# Patient Record
Sex: Male | Born: 1970
Health system: Southern US, Community
[De-identification: ages and names within clinical notes are randomized; demographics above are authoritative.]

## PROBLEM LIST (undated history)

## (undated) DIAGNOSIS — G4730 Sleep apnea, unspecified: Secondary | ICD-10-CM

## (undated) DIAGNOSIS — E785 Hyperlipidemia, unspecified: Secondary | ICD-10-CM

## (undated) DIAGNOSIS — Z972 Presence of dental prosthetic device (complete) (partial): Secondary | ICD-10-CM

## (undated) DIAGNOSIS — E291 Testicular hypofunction: Secondary | ICD-10-CM

## (undated) DIAGNOSIS — E669 Obesity, unspecified: Secondary | ICD-10-CM

## (undated) DIAGNOSIS — K219 Gastro-esophageal reflux disease without esophagitis: Secondary | ICD-10-CM

## (undated) DIAGNOSIS — E119 Type 2 diabetes mellitus without complications: Secondary | ICD-10-CM

## (undated) DIAGNOSIS — I10 Essential (primary) hypertension: Secondary | ICD-10-CM

## (undated) DIAGNOSIS — R300 Dysuria: Secondary | ICD-10-CM

## (undated) DIAGNOSIS — J189 Pneumonia, unspecified organism: Secondary | ICD-10-CM

## (undated) DIAGNOSIS — N400 Enlarged prostate without lower urinary tract symptoms: Secondary | ICD-10-CM

## (undated) DIAGNOSIS — Z87442 Personal history of urinary calculi: Secondary | ICD-10-CM

## (undated) DIAGNOSIS — M1990 Unspecified osteoarthritis, unspecified site: Secondary | ICD-10-CM

## (undated) DIAGNOSIS — N529 Male erectile dysfunction, unspecified: Secondary | ICD-10-CM

## (undated) DIAGNOSIS — R351 Nocturia: Secondary | ICD-10-CM

## (undated) HISTORY — PX: CHOLECYSTECTOMY: SHX55

## (undated) HISTORY — DX: Testicular hypofunction: E29.1

## (undated) HISTORY — DX: Dysuria: R30.0

## (undated) HISTORY — DX: Unspecified osteoarthritis, unspecified site: M19.90

## (undated) HISTORY — DX: Gastro-esophageal reflux disease without esophagitis: K21.9

## (undated) HISTORY — DX: Type 2 diabetes mellitus without complications: E11.9

## (undated) HISTORY — DX: Benign prostatic hyperplasia without lower urinary tract symptoms: N40.0

## (undated) HISTORY — DX: Essential (primary) hypertension: I10

## (undated) HISTORY — DX: Hyperlipidemia, unspecified: E78.5

## (undated) HISTORY — DX: Obesity, unspecified: E66.9

## (undated) HISTORY — DX: Sleep apnea, unspecified: G47.30

## (undated) HISTORY — DX: Male erectile dysfunction, unspecified: N52.9

## (undated) HISTORY — PX: VASECTOMY: SHX75

## (undated) HISTORY — DX: Nocturia: R35.1

---

## 2004-04-18 ENCOUNTER — Inpatient Hospital Stay: Payer: Self-pay | Admitting: Internal Medicine

## 2007-02-01 ENCOUNTER — Ambulatory Visit: Payer: Self-pay

## 2007-05-28 ENCOUNTER — Ambulatory Visit: Payer: Self-pay

## 2007-06-25 ENCOUNTER — Ambulatory Visit: Payer: Self-pay | Admitting: Family Medicine

## 2007-11-10 ENCOUNTER — Ambulatory Visit: Payer: Self-pay | Admitting: Family Medicine

## 2007-11-11 ENCOUNTER — Ambulatory Visit: Payer: Self-pay | Admitting: Internal Medicine

## 2007-11-12 ENCOUNTER — Ambulatory Visit: Payer: Self-pay

## 2009-06-10 ENCOUNTER — Emergency Department: Payer: Self-pay | Admitting: Emergency Medicine

## 2010-12-24 ENCOUNTER — Ambulatory Visit: Payer: Self-pay | Admitting: Family Medicine

## 2011-03-12 ENCOUNTER — Ambulatory Visit: Payer: Self-pay | Admitting: Family Medicine

## 2011-04-02 ENCOUNTER — Ambulatory Visit: Payer: Self-pay | Admitting: Family Medicine

## 2011-04-28 ENCOUNTER — Ambulatory Visit: Payer: Self-pay | Admitting: Family Medicine

## 2012-05-09 ENCOUNTER — Inpatient Hospital Stay: Payer: Self-pay | Admitting: Surgery

## 2012-05-09 LAB — BASIC METABOLIC PANEL
Anion Gap: 7 (ref 7–16)
BUN: 9 mg/dL (ref 7–18)
Chloride: 102 mmol/L (ref 98–107)
Co2: 24 mmol/L (ref 21–32)
EGFR (African American): >60
Glucose: 234 mg/dL — ABNORMAL HIGH (ref 65–99)
Osmolality: 273 (ref 275–301)
Potassium: 4.7 mmol/L (ref 3.5–5.1)
Sodium: 133 mmol/L — ABNORMAL LOW (ref 136–145)

## 2012-05-09 LAB — CBC
HGB: 16.9 g/dL (ref 13.0–18.0)
MCHC: 33.5 g/dL (ref 32.0–36.0)
Platelet: 241 10*3/uL (ref 150–440)
RBC: 5.68 10*6/uL (ref 4.40–5.90)
RDW: 13.4 % (ref 11.5–14.5)
WBC: 13.7 10*3/uL — ABNORMAL HIGH (ref 3.8–10.6)

## 2012-05-09 LAB — TROPONIN I: Troponin-I: <0.02 ng/mL

## 2012-05-09 LAB — HEPATIC FUNCTION PANEL A (ARMC)
Bilirubin,Total: 0.4 mg/dL (ref 0.2–1.0)
SGOT(AST): 32 U/L (ref 15–37)
SGPT (ALT): 56 U/L (ref 12–78)
Total Protein: 7.7 g/dL (ref 6.4–8.2)

## 2012-05-09 LAB — LIPASE, BLOOD: Lipase: 144 U/L (ref 73–393)

## 2012-05-10 LAB — CBC WITH DIFFERENTIAL/PLATELET
Basophil #: 0 10*3/uL (ref 0.0–0.1)
Eosinophil %: 0.1 %
HCT: 43.8 % (ref 40.0–52.0)
HGB: 14.8 g/dL (ref 13.0–18.0)
Lymphocyte %: 5.5 %
MCH: 30.3 pg (ref 26.0–34.0)
MCV: 90 fL (ref 80–100)
Monocyte #: 1.1 x10 3/mm — ABNORMAL HIGH (ref 0.2–1.0)
Monocyte %: 5.6 %
Neutrophil %: 88.6 %
Platelet: 201 10*3/uL (ref 150–440)
RBC: 4.88 10*6/uL (ref 4.40–5.90)

## 2012-05-10 LAB — COMPREHENSIVE METABOLIC PANEL
Alkaline Phosphatase: 81 U/L (ref 50–136)
EGFR (African American): 53 — ABNORMAL LOW
EGFR (Non-African Amer.): 46 — ABNORMAL LOW
Glucose: 127 mg/dL — ABNORMAL HIGH (ref 65–99)
Potassium: 4.5 mmol/L (ref 3.5–5.1)
SGOT(AST): 34 U/L (ref 15–37)
Sodium: 136 mmol/L (ref 136–145)
Total Protein: 6.1 g/dL — ABNORMAL LOW (ref 6.4–8.2)

## 2012-05-11 LAB — CBC WITH DIFFERENTIAL/PLATELET
Basophil %: 0.2 %
Eosinophil #: 0 10*3/uL (ref 0.0–0.7)
HCT: 38.5 % — ABNORMAL LOW (ref 40.0–52.0)
Lymphocyte #: 0.7 10*3/uL — ABNORMAL LOW (ref 1.0–3.6)
Lymphocyte %: 4.8 %
MCH: 30.4 pg (ref 26.0–34.0)
MCV: 90 fL (ref 80–100)
Monocyte #: 1.1 x10 3/mm — ABNORMAL HIGH (ref 0.2–1.0)
Neutrophil #: 12 10*3/uL — ABNORMAL HIGH (ref 1.4–6.5)
Neutrophil %: 87.1 %
RBC: 4.29 10*6/uL — ABNORMAL LOW (ref 4.40–5.90)
RDW: 13.6 % (ref 11.5–14.5)

## 2012-05-11 LAB — BASIC METABOLIC PANEL
BUN: 15 mg/dL (ref 7–18)
Calcium, Total: 7.9 mg/dL — ABNORMAL LOW (ref 8.5–10.1)
Creatinine: 1.02 mg/dL (ref 0.60–1.30)
EGFR (African American): >60
Glucose: 159 mg/dL — ABNORMAL HIGH (ref 65–99)
Osmolality: 278 (ref 275–301)
Potassium: 4.2 mmol/L (ref 3.5–5.1)

## 2012-06-15 DIAGNOSIS — Z79899 Other long term (current) drug therapy: Secondary | ICD-10-CM | POA: Insufficient documentation

## 2014-05-02 ENCOUNTER — Ambulatory Visit
Admit: 2014-05-02 | Disposition: A | Discharge status: Home / Self Care | Payer: Self-pay | Attending: Urology | Admitting: Urology

## 2014-05-02 LAB — CBC WITH DIFFERENTIAL/PLATELET
BASOS ABS: 0.3 10*3/uL — AB (ref 0.0–0.1)
BASOS PCT: 3.1 %
EOS ABS: 0.2 10*3/uL (ref 0.0–0.7)
Eosinophil %: 2.6 %
HCT: 48 % (ref 40.0–52.0)
HGB: 16.1 g/dL (ref 13.0–18.0)
LYMPHS PCT: 18.9 %
Lymphocyte #: 1.6 10*3/uL (ref 1.0–3.6)
MCH: 29.2 pg (ref 26.0–34.0)
MCHC: 33.5 g/dL (ref 32.0–36.0)
MCV: 87 fL (ref 80–100)
MONO ABS: 0.4 x10 3/mm (ref 0.2–1.0)
Monocyte %: 5.2 %
Neutrophil #: 5.8 10*3/uL (ref 1.4–6.5)
Neutrophil %: 70.2 %
Platelet: 181 10*3/uL (ref 150–440)
RBC: 5.5 10*6/uL (ref 4.40–5.90)
RDW: 14 % (ref 11.5–14.5)
WBC: 8.2 10*3/uL (ref 3.8–10.6)

## 2014-05-19 NOTE — Discharge Summary (Signed)
PATIENT NAME:  Ronald Pruitt, Ronald Pruitt MR#:  [id number] DATE OF BIRTH:  [birth year]  DATE OF ADMISSION:  05/09/2012  DATE OF DISCHARGE:  05/12/2012  FINAL DIAGNOSES:  Acute cholecystitis, hypertension, obesity, hypercholesterolemia and type 2 diabetes.  PRINCIPAL PROCEDURE: Laparoscopic cholecystectomy on 05/10/2012.   HOSPITAL COURSE SUMMARY:  The patient was admitted on the 13th of April with abdominal pain and clinical diagnosis of acute cholecystitis. He was taken to the operating room on the 14th where a cholecystectomy was performed. Postoperatively, the patient did well. On postoperative day #1, the patient had low-grade fever. His creatinine was improving with hydration. His antibiotics were continued.  On postoperative day #2, the patient was stable and improved for discharge, remaining afebrile. He was tolerating a regular diet, ambulatory with minimal pain. Discharged home in stable condition. Follow up on April 24th.   MEDICATIONS:  Omeprazole 20 mg by mouth once a day, allopurinol 100 mg by mouth once a day, Janumet 1000 mg/50 mg tablet 1 tab by mouth b.i.d., lisinopril 10 mg by mouth once a day, amlodipine 10 mg by mouth once a day, simvastatin 20 mg by mouth once a day, acetaminophen/oxycodone 5/325, 1 tab every 4 hours as needed for pain, metformin 1000 mg by mouth b.i.d. with meals, sitagliptin 50 mg by mouth with meals and Augmentin 500 mg by mouth b.i.d. for 5 more days.    ____________________________ Jeannette How Marina Gravel, MD mab:dmm D: 05/25/2012 21:21:00 ET T: 05/25/2012 21:43:11 ET JOB#: [id number]  cc: Elta Guadeloupe A. Marina Gravel, MD, <Dictator> Rose Fillers. [place], [place] Keaira Whitehurst A Kambryn Dapolito MD ELECTRONICALLY SIGNED 05/25/2012 22:06

## 2014-05-19 NOTE — H&P (Signed)
PATIENT NAME:  Ronald Pruitt, Ronald Pruitt MR#:  [id number] DATE OF BIRTH:  1970/02/01  DATE OF ADMISSION:  05/09/2012  ATTENDING PHYSICIAN: Dr. Marlyce Huge.   REASON FOR ADMISSION: Epigastric and right upper quadrant pain.   HISTORY OF PRESENT ILLNESS: The patient is a pleasant 44 year old male with a history of hypertension and GERD who presents with now 9 hours of acute-onset epigastric and right upper quadrant pain. He says that it began acutely and woke him up from sleep on 1 in the morning. It has not improved. He describes it as cramping and worsening. He says that he forced himself to throw up to see if that would make him feel better, and it did not improve.   He also reports chills. He says he is having regular bowel movements. Has never had pain like this before. Otherwise no fevers, night sweats, shortness of breath, cough, chest pain, current  nausea, vomiting, irregular bowel movements. No dysuria or hematuria.   PAST MEDICAL HISTORY:  1.  History of gout. 2.  Hypertension.  3.  Migraines.  4.  GERD.   HOME MEDICATIONS:  Are as follows:  1.  Toprol-XL 50 mg p.o. daily.  2.  Nexium 1 tablet p.o. daily.  3.  Excedrin Migraine p.o., p.r.n.  4.  Allopurinol 0.6 p.r.n., gout.   ALLERGIES: No known drug allergies.   SOCIAL HISTORY: Denies tobacco use. Lives in [place]. Does say he rarely drinks alcohol socially.   FAMILY HISTORY: Diabetes, hypertension, coronary artery disease, family history of breast and lung cancer.   REVIEW OF SYSTEMS: 12-point review of systems obtained. Pertinent positives and negatives as above.   PHYSICAL EXAMINATION: VITAL SIGNS: 98.3, pulse 72, blood pressure 180/104, respirations 18; 98% on room air.  GENERAL: No acute distress. Alert and oriented x3.  HEAD: Normocephalic, atraumatic.  EYES: No scleral icterus. No conjunctivitis.  NECK: No obvious neck masses.  CHEST: Lungs clear to auscultation, moving air well.  HEART: Regular rate and  rhythm. No murmurs, rubs, or gallops.  ABDOMEN: Mildly tender in the right upper quadrant, nondistended.  EXTREMITIES: Moves all extremities well. Strength 5/5.  NEUROLOGIC: Cranial nerves II-XII grossly intact. Sensation intact in all 4 extremities.   LABS: Are as follows: Significant for a white cell count of 13.7, hemoglobin and hematocrit of 16.9/50.5, platelets 241. LFTs are normal. Blood sugar is 234.   Ultrasound shows a large, nonmobile 2.1 cm stone in the gallbladder neck.   ASSESSMENT AND PLAN: The patient is a pleasant 44 year old male with a history of acute-onset right upper quadrant pain which sounds biliary in nature. He has an elevated white blood cell count as well as an impacted stone. Likely has a cholecystitis versus symptomatic cholelithiasis with hydrops.   Will admit, resuscitate, and plan cholecystectomy, possibly today. I have spoken about this plan with him, and this will be the  plan.    ____________________________ Glena Norfolk. Ronald Hillmer, MD cal:dm D: 05/09/2012 11:00:41 ET T: 05/09/2012 11:46:27 ET JOB#: [id number]  cc:   Floyde Parkins MD ELECTRONICALLY SIGNED 05/12/2012 16:13

## 2014-05-19 NOTE — Op Note (Signed)
PATIENT NAME:  Ronald Pruitt, Ronald Pruitt MR#:  [id number] DATE OF BIRTH:  08-23-1970  DATE OF PROCEDURE:  05/10/2012  PREOPERATIVE DIAGNOSIS: Acute calculus cholecystitis.   POSTOPERATIVE DIAGNOSES:  1.  Acute acalculus cholecystitis. 2.  Gallbladder hydrops.   PROCEDURE PERFORMED: Laparoscopic cholecystectomy.   SURGEON: Sherri Rad, M.D.   ASSISTANT: None.   ANESTHESIA: General endotracheal.   INDICATIONS: The patient is a 44 year old obese white male with a 1 to 2 day history of unrelenting right upper quadrant and epigastric abdominal pain. Clinical examination and radiographic workup is consistent with acute calculus cholecystitis. I discussed with him and his daughter at length intervention to include laparoscopic cholecystectomy, the risks of surgery including that of bleeding, infection, bile duct injury, need for conversion to open operation. All of their questions were answered.   FINDINGS: Gallbladder hydrops. There was a large stone lodged within the gallbladder neck.   SPECIMENS: Gallbladder with contents to pathology.   DRAINS: None.   LAP AND NEEDLE COUNT: Correct x 2.   ESTIMATED BLOOD LOSS: 25 mL.   DESCRIPTION OF PROCEDURE: With the patient in the supine position, general endotracheal anesthesia was induced without difficulty. The patient's abdomen was clipped of hair, prepped and draped with ChloraPrep solution. Timeout was observed.   A primary infraumbilical transversely oriented skin incision was fashioned with a scalpel and carried down with sharp dissection to the abdominal midline fascia, which was incised in the midline with a scalpel and elevated with Kocher clamps. The peritoneum was entered sharply. A 0 Vicryl U-stitch was passed on either side of the abdominal midline fascia securing a 12 mm blunt Hassan trocar placed under direct visualization. Pneumoperitoneum was established. The patient was then positioned in reverse Trendelenburg in  right side up. The remaining  5 mm trocars were placed in the mid epigastrium and 2 in the right subcostal margin. The gallbladder was aspirated of approximately 30 mL of clear hydropic bile. This was grasped along its fundus and elevated over the right lobe of the liver towards the right shoulder. Lateral traction was placed on Hartmann pouch. The hepatoduodenal ligament was then incised and dissected demonstrating a single cystic duct and single cystic artery with posterior branch. Critical view of safety cholecystectomy was ensured. Three clips were placed on the portal side of the cystic duct, one clip on the gallbladder side and the structure was then divided. Immediately adjacent, a cystic artery with posterior branch was identified. Intervening lymphatic was divided between hemoclips.   The gallbladder was then retrieved off the gallbladder fossa utilizing hook cautery apparatus. One small superficial venous structure was divided between hemoclips. The gallbladder was then retrieved off the remaining portion of the gallbladder fossa and placed into an Endo Catch device and retrieved. Pneumoperitoneum was re-established. In the process of retrieving the gallbladder specimen, a 5 mm camera was inserted in the epigastric port. No evidence of bowel injury at the periumbilical incision site.   The right upper quadrant was then irrigated with a total of 1 L of warm normal saline and aspirated dry and point hemostasis in the gallbladder fossa was obtained with electrocautery. With no evidence of further bleeding and no evidence of bile staining or bowel injury, ports were then removed under direct visualization. A total of 30 mL of 0.25% plain Marcaine was infiltrated along all skin and fascial incisions prior to closure.   The infraumbilical fascial defect was closed with figure-of-eight #0 Vicryl suture x 2 and the existing  stay sutures tied to each  other in vertical orientation. 4-0 Vicryl subcuticular was applied to all skin edges  followed by the application of benzoin, Steri-Strips, Telfa, and Tegaderm. The patient was then taken to the recovery room in stable and satisfactory condition by anesthesia services.   ____________________________ Jeannette How Marina Gravel, MD mab:aw D: 05/10/2012 12:05:05 ET T: 05/10/2012 12:15:31 ET JOB#: [id number]  cc: Elta Guadeloupe A. Marina Gravel, MD, <Dictator> Brett Albino, MD [place] MD ELECTRONICALLY SIGNED 05/21/2012 9:28

## 2014-09-14 ENCOUNTER — Encounter: Payer: Self-pay | Admitting: *Deleted

## 2014-09-22 ENCOUNTER — Ambulatory Visit: Payer: Self-pay | Admitting: Urology

## 2015-03-14 ENCOUNTER — Encounter: Payer: Self-pay | Admitting: Urology

## 2015-03-14 ENCOUNTER — Ambulatory Visit (INDEPENDENT_AMBULATORY_CARE_PROVIDER_SITE_OTHER): Payer: BLUE CROSS/BLUE SHIELD | Admitting: Urology

## 2015-03-14 VITALS — BP 177/103 | HR 82 | Ht 72.0 in | Wt 238.3 lb

## 2015-03-14 DIAGNOSIS — N138 Other obstructive and reflux uropathy: Secondary | ICD-10-CM

## 2015-03-14 DIAGNOSIS — N528 Other male erectile dysfunction: Secondary | ICD-10-CM

## 2015-03-14 DIAGNOSIS — N529 Male erectile dysfunction, unspecified: Secondary | ICD-10-CM

## 2015-03-14 DIAGNOSIS — N401 Enlarged prostate with lower urinary tract symptoms: Secondary | ICD-10-CM

## 2015-03-14 DIAGNOSIS — E291 Testicular hypofunction: Secondary | ICD-10-CM | POA: Diagnosis not present

## 2015-03-14 MED ORDER — TADALAFIL 5 MG PO TABS: ORAL_TABLET | ORAL | Status: DC

## 2015-03-14 NOTE — Progress Notes (Signed)
03/14/2015 4:27 PM   Ronald Pruitt. Dec 26, 1970 [id number]  Referring provider: No referring provider defined for this encounter.  Chief Complaint  Patient presents with  . Hypogonadism    Needs new testosterone rx has new insurance    HPI: Patient is a 45 year old Caucasian male who has a history of hypogonadism and was under therapy but then lost his insurance and had to discontinue his therapy. He presents now to be restarted on a testosterone regimen.  Hypogonadism Patient is experiencing a decrease in libido, a lack of energy, a decrease in strength, a decreased enjoyment in life, erections being less strong, falling asleep after dinner and a recent deterioration in their work performance.  This is indicated by his responses to the ADAM questionnaire.       Androgen Deficiency in the Aging Male      03/14/15 1600       Androgen Deficiency in the Aging Male   Do you have a decrease in libido (sex drive) Yes     Do you have lack of energy Yes     Do you have a decrease in strength and/or endurance Yes     Have you lost height No     Have you noticed a decreased "enjoyment of life" Yes     Are you sad and/or grumpy No     Are your erections less strong Yes     Have you noticed a recent deterioration in your ability to play sports No     Are you falling asleep after dinner Yes     Has there been a recent deterioration in your work performance Yes         Erectile dysfunction His SHIM score is 13, which is mild to moderate ED.   He has been having difficulty with erections for the last few years.   His major complaint is confidence to achieve an erection.  His libido is preserved.   His risk factors for ED are age, smoking, BPH, hypogonadism, hypertension and hyperlipidemia.  He denies any painful erections or curvatures with his erections.   He has tried Cialis in the past with good results.      SHIM      03/14/15 1600       SHIM: Over the last 6 months:   How do you rate your confidence that you could get and keep an erection? Very Low     When you had erections with sexual stimulation, how often were your erections hard enough for penetration (entering your partner)? Sometimes (about half the time)     During sexual intercourse, how often were you able to maintain your erection after you had penetrated (entered) your partner? Difficult     During sexual intercourse, how difficult was it to maintain your erection to completion of intercourse? Difficult     When you attempted sexual intercourse, how often was it satisfactory for you? Difficult     SHIM Total Score   SHIM 13        Score: 1-7 Severe ED 8-11 Moderate ED 12-16 Mild-Moderate ED 17-21 Mild ED 22-25 No ED  BPH WITH LUTS His IPSS score today is 14, which is moderate lower urinary tract symptomatology. He is mixed with his quality life due to his urinary symptoms.  His major complaint today is frequency.  He has had these symptoms for few years.  He stated he did well with the Cialis 5 mg daily and would  like to restart that medication   He denies any dysuria, hematuria or suprapubic pain.  He also denies any recent fevers, chills, nausea or vomiting.  He does not have a family history of PCa.      IPSS      03/14/15 1500       International Prostate Symptom Score   How often have you had the sensation of not emptying your bladder? Less than half the time     How often have you had to urinate less than every two hours? About half the time     How often have you found you stopped and started again several times when you urinated? Not at All     How often have you found it difficult to postpone urination? Less than 1 in 5 times     How often have you had a weak urinary stream? Less than half the time     How often have you had to strain to start urination? Less than 1 in 5 times     How many times did you typically get up at night to urinate? 5 Times     Total IPSS Score 14       Quality of Life due to urinary symptoms   If you were to spend the rest of your life with your urinary condition just the way it is now how would you feel about that? Mixed        Score:  1-7 Mild 8-19 Moderate 20-35 Severe  PMH: Past Medical History  Diagnosis Date  . Dysuria   . Nocturia   . HTN (hypertension)   . Sleep apnea   . Diabetes ([place])   . GERD (gastroesophageal reflux disease)   . Impotence   . BPH (benign prostatic hyperplasia)   . Testicular hypofunction   . Obesity     Surgical History: Past Surgical History  Procedure Laterality Date  . Cholecystectomy    . Vasectomy      Home Medications:    Medication List       This list is accurate as of: 03/14/15  4:27 PM.  Always use your most recent med list.               allopurinol 100 MG tablet  Commonly known as:  ZYLOPRIM  Take 100 mg by mouth daily.     amLODipine 10 MG tablet  Commonly known as:  NORVASC  Take 10 mg by mouth daily.     atorvastatin 20 MG tablet  Commonly known as:  LIPITOR  Take 20 mg by mouth daily.     diltiazem 240 MG 24 hr tablet  Commonly known as:  CARDIZEM LA  Take 240 mg by mouth daily. Reported on 03/14/2015     lisinopril 10 MG tablet  Commonly known as:  PRINIVIL,ZESTRIL  Take 10 mg by mouth daily. Reported on 03/14/2015     omeprazole 20 MG capsule  Commonly known as:  PRILOSEC  Take 20 mg by mouth daily.     sitaGLIPtin-metformin 50-1000 MG tablet  Commonly known as:  JANUMET  Take 1 tablet by mouth 2 (two) times daily with a meal. Reported on 03/14/2015     tadalafil 5 MG tablet  Commonly known as:  CIALIS  Take one tablet daily for BPH     testosterone cypionate 200 MG/ML injection  Commonly known as:  DEPOTESTOSTERONE CYPIONATE  Inject 200 mg into the muscle every 14 (fourteen) days. Reported on  03/14/2015        Allergies: No Known Allergies  Family History: Family History  Problem Relation Age of Onset  . Hyperlipidemia Mother   .  Diabetes Mellitus II Mother   . Diabetes Father   . Seizures Mother   . Kidney disease Neg Hx   . Prostate cancer Neg Hx     Social History:  reports that he has been smoking.  He does not have any smokeless tobacco history on file. He reports that he does not drink alcohol or use illicit drugs.  ROS: UROLOGY Frequent Urination?: Yes Hard to postpone urination?: No Burning/pain with urination?: No Get up at night to urinate?: Yes Leakage of urine?: No Urine stream starts and stops?: No Trouble starting stream?: No Do you have to strain to urinate?: No Blood in urine?: No Urinary tract infection?: No Sexually transmitted disease?: No Injury to kidneys or bladder?: No Painful intercourse?: No Weak stream?: No Erection problems?: No Penile pain?: No  Gastrointestinal Nausea?: No Vomiting?: No Indigestion/heartburn?: Yes Diarrhea?: No Constipation?: No  Constitutional Fever: No Night sweats?: No Weight loss?: No Fatigue?: Yes  Skin Skin rash/lesions?: No Itching?: No  Eyes Blurred vision?: No Double vision?: No  Ears/Nose/Throat Sore throat?: No Sinus problems?: No  Hematologic/Lymphatic Swollen glands?: No Easy bruising?: No  Cardiovascular Leg swelling?: No Chest pain?: No  Respiratory Cough?: No Shortness of breath?: No  Endocrine Excessive thirst?: No  Musculoskeletal Back pain?: No Joint pain?: No  Neurological Headaches?: No Dizziness?: No  Psychologic Depression?: No Anxiety?: No  Physical Exam: BP 177/103 mmHg  Pulse 82  Ht 6' (1.829 m)  Wt 238 lb 4.8 oz (108.092 kg)  BMI 32.31 kg/m2  Constitutional: Well nourished. Alert and oriented, No acute distress. HEENT: [place] AT, moist mucus membranes. Trachea midline, no masses. Cardiovascular: No clubbing, cyanosis, or edema. Respiratory: Normal respiratory effort, no increased work of breathing. GI: Abdomen is soft, non tender, non distended, no abdominal masses. Liver and spleen  not palpable.  No hernias appreciated.  Stool sample for occult testing is not indicated.   GU: No CVA tenderness.  No bladder fullness or masses.  Patient with uncircumcised phallus. Foreskin easily retracted  Urethral meatus is patent.  No penile discharge. No penile lesions or rashes. Scrotum without lesions, cysts, rashes and/or edema.  Testicles are located scrotally bilaterally. No masses are appreciated in the testicles. Left and right epididymis are normal. Rectal: Patient with  normal sphincter tone. Anus and perineum without scarring or rashes. No rectal masses are appreciated. Prostate is approximately 45 grams, no nodules are appreciated. Seminal vesicles are normal. Skin: No rashes, bruises or suspicious lesions. Lymph: No cervical or inguinal adenopathy. Neurologic: Grossly intact, no focal deficits, moving all 4 extremities. Psychiatric: Normal mood and affect.  Laboratory Data: Lab Results  Component Value Date   WBC 8.2 05/02/2014   HGB 16.1 05/02/2014   HCT 48.0 05/02/2014   MCV 87 05/02/2014   PLT 181 05/02/2014    Lab Results  Component Value Date   CREATININE 1.02 05/11/2012    Lab Results  Component Value Date   AST 34 05/10/2012   Lab Results  Component Value Date   ALT 45 05/10/2012    Assessment & Plan:    1. Hypogonadism:   I explained to patient that hypogonadism is diagnosed after 2 morning serum testosterone draws before 9 AM 3 days apart returned below the laboratories parameter for normal testosterone. I reminded the patient the side effects of  testosterone therapy, such as: enlargement of the prostate gland that may in turn cause LUTS, possible increased risk of PCa, DVT's and/or PE's, possible increased risk of heart attack or stroke, lower sperm count, swelling of the ankles, feet, or body, with or without heart failure, enlarged or painful breasts, have problems breathing while you sleep (sleep apnea), increased prostate specific antigen, mood  swings, hypertension and increased red blood cell count.  I also discussed that some men have had success using clomid for hypogonadism.  It does seem to be more successful in younger men, but there are incidences of good results in middle-aged men.  I explained that it is used in male infertility to stimulate the testicles to make more testosterone/sperm.  There has been no long term data on side effects, but some urologists has been having success with this medication.   He will return in the morning before 9 AM for his first serum testosterone draw.  We'll also obtain an estradiol, FSH/LH, prolactin, TSH and hepatic function panel at that time.    2. Erectile dysfunction:  SHIM score is 13.  We will readdress once we get his testosterone within therapeutic levels.  3. BPH with LUTS:   IPSS score is 14/3.  He is a good response with Cialis 5 mg daily in the past. We will restart that medication. We will repeat the I PSS score and exam.  Return for pending labs.  These notes generated with voice recognition software. I apologize for typographical errors.  Zara Council, [facility] [address], [place], [place] [address] [phone]

## 2015-03-15 DIAGNOSIS — N138 Other obstructive and reflux uropathy: Secondary | ICD-10-CM | POA: Insufficient documentation

## 2015-03-15 DIAGNOSIS — E291 Testicular hypofunction: Secondary | ICD-10-CM | POA: Insufficient documentation

## 2015-03-15 DIAGNOSIS — N401 Enlarged prostate with lower urinary tract symptoms: Secondary | ICD-10-CM

## 2015-03-15 DIAGNOSIS — N529 Male erectile dysfunction, unspecified: Secondary | ICD-10-CM | POA: Insufficient documentation

## 2015-03-27 ENCOUNTER — Telehealth: Payer: Self-pay

## 2015-03-27 NOTE — Telephone Encounter (Signed)
I never received the results of his blood work, so I cannot call in any medications until I receive those labs.

## 2015-03-27 NOTE — Telephone Encounter (Signed)
Pt called stating he had blood work done by the work [employer] and was under the impression you would be calling in testosterone. Please advise.

## 2015-03-29 NOTE — Telephone Encounter (Signed)
LMOM- no labs received therefore no medication sent in

## 2015-04-12 ENCOUNTER — Telehealth: Payer: Self-pay | Admitting: Urology

## 2015-04-12 ENCOUNTER — Other Ambulatory Visit: Payer: Self-pay | Admitting: Urology

## 2015-04-12 DIAGNOSIS — E291 Testicular hypofunction: Secondary | ICD-10-CM

## 2015-04-12 MED ORDER — CLOMIPHENE CITRATE 50 MG PO TABS: ORAL_TABLET | ORAL | Status: DC

## 2015-04-12 NOTE — Telephone Encounter (Signed)
I spoke to the patient and we will start the Clomid.  He will have his employer draw the testosterone labs before 9 AM and fax them to [place]. I have sent the prescription to [facility].

## 2015-04-12 NOTE — Telephone Encounter (Signed)
I have tried to contact the patient.  The cell phone number listed was for a [employer] and his home phone number voice mail was full.  Is he associated with a [employer]?

## 2015-06-10 ENCOUNTER — Telehealth: Payer: Self-pay | Admitting: Urology

## 2015-06-10 NOTE — Telephone Encounter (Signed)
Patient was started on Clomid in March.

## 2015-06-10 NOTE — Telephone Encounter (Signed)
Patient was given Clomid in March. I do not see where he had a morning testosterone drawn before 9 AM recently. Please contact the patient and have him come in for a morning testosterone drawn before 9 AM.

## 2015-06-13 NOTE — Telephone Encounter (Signed)
LMOM

## 2015-06-14 NOTE — Telephone Encounter (Signed)
Spoke with pt in reference to labs. Pt stated that he had them drawn at his employers. Made pt aware we do not have the results and requested pt have them refaxed. Pt voiced understanding.

## 2015-06-26 ENCOUNTER — Other Ambulatory Visit: Payer: Self-pay

## 2015-07-15 NOTE — Telephone Encounter (Addendum)
Please apologize to the patient as no one notified me that they had scanned his labs into the chart on May 30.  I have reviewed the lab and his testosterone level is within normal limits, but it was drawn in the afternoon. When the patient is on Clomid the testosterone needs to be drawn between the levels of 8 and 10 in the morning.  He will need an office visit with me in August and we can either repeat those labs or his employer can repeat the labs.

## 2015-07-16 NOTE — Telephone Encounter (Signed)
Mailbox full

## 2015-07-18 NOTE — Telephone Encounter (Signed)
Not able to get in touch with pt. Will send a letter.  

## 2015-10-23 ENCOUNTER — Ambulatory Visit (INDEPENDENT_AMBULATORY_CARE_PROVIDER_SITE_OTHER): Payer: BLUE CROSS/BLUE SHIELD | Admitting: Urology

## 2015-10-23 ENCOUNTER — Encounter: Payer: Self-pay | Admitting: Urology

## 2015-10-23 ENCOUNTER — Telehealth: Payer: Self-pay | Admitting: Family Medicine

## 2015-10-23 VITALS — BP 158/115 | HR 83 | Ht 73.0 in | Wt 239.0 lb

## 2015-10-23 DIAGNOSIS — R351 Nocturia: Secondary | ICD-10-CM | POA: Diagnosis not present

## 2015-10-23 DIAGNOSIS — N401 Enlarged prostate with lower urinary tract symptoms: Secondary | ICD-10-CM

## 2015-10-23 DIAGNOSIS — N138 Other obstructive and reflux uropathy: Secondary | ICD-10-CM

## 2015-10-23 DIAGNOSIS — N529 Male erectile dysfunction, unspecified: Secondary | ICD-10-CM

## 2015-10-23 DIAGNOSIS — E291 Testicular hypofunction: Secondary | ICD-10-CM | POA: Diagnosis not present

## 2015-10-23 LAB — BLADDER SCAN AMB NON-IMAGING

## 2015-10-23 MED ORDER — CLOMIPHENE CITRATE 50 MG PO TABS: ORAL_TABLET | ORAL | 3 refills | Status: DC

## 2015-10-23 NOTE — Telephone Encounter (Signed)
Patient states that it has been years that you seen him, wanted to know if he could start coming back to you. Has been getting care from NP from work  Cb# [phone]

## 2015-10-23 NOTE — Telephone Encounter (Signed)
Not taking new patients right now  

## 2015-10-23 NOTE — Progress Notes (Signed)
10/23/2015 11:17 AM   Ronald Pruitt. 10/03/1970 [id number]  Referring provider: No referring provider defined for this encounter.  Chief Complaint  Patient presents with  . Hypogonadism    HPI: Patient is a 45 year old Caucasian male with hypogonadism, ED and BPH with LUTS.    Hypogonadism Patient is experiencing a decrease in libido, a lack of energy, a decrease in strength, a decreased enjoyment in life, sadness and/or grumpiness, erections being less strong and falling asleep after dinner.  This is indicated by his responses to the ADAM questionnaire. He is not having spontaneous erections at night.  He has sleep apnea and is not sleeping with a CPAP machine.  he is currently managing his hypogonadism with Clomid 50 mg, 1/2 daily.  His most recent testosterone was 450 ng/dL in 04/2015 and this was an afternoon draw.       Androgen Deficiency in the Aging Male    [place] Name 10/23/15 1000         Androgen Deficiency in the Aging Male   Do you have a decrease in libido (sex drive) Yes     Do you have lack of energy Yes     Do you have a decrease in strength and/or endurance Yes     Have you lost height No     Have you noticed a decreased "enjoyment of life" Yes     Are you sad and/or grumpy Yes     Are your erections less strong Yes     Have you noticed a recent deterioration in your ability to play sports No     Are you falling asleep after dinner Yes     Has there been a recent deterioration in your work performance No       Erectile dysfunction His SHIM score is 16, which is mild to moderate ED.   His previous 13.  He has been having difficulty with erections for the last few years.   His major complaint is confidence to achieve an erection.  His libido is preserved.   His risk factors for ED are age, smoking, BPH, hypogonadism, sleep apnea, hypertension and hyperlipidemia.  He denies any painful erections or curvatures with his erections.   He has tried Cialis in  the past with good results, but he is finding cost prohibitive.  He is not sleeping with his CPAP machine.        [place] Name 10/23/15 1056         SHIM: Over the last 6 months:   How do you rate your confidence that you could get and keep an erection? Low     When you had erections with sexual stimulation, how often were your erections hard enough for penetration (entering your partner)? Most Times (much more than half the time)     During sexual intercourse, how often were you able to maintain your erection after you had penetrated (entered) your partner? Slightly Difficult     During sexual intercourse, how difficult was it to maintain your erection to completion of intercourse? Slightly Difficult     When you attempted sexual intercourse, how often was it satisfactory for you? Very Difficult       SHIM Total Score   SHIM 16        Score: 1-7 Severe ED 8-11 Moderate ED 12-16 Mild-Moderate ED 17-21 Mild ED 22-25 No ED  BPH WITH LUTS His IPSS score today is 22, which is  severe lower urinary tract symptomatology. He is terrible with his quality life due to his urinary symptoms.  His PVR was 0 mL.  His previous IPSS score was 14/3.  His major complaint today is nocturia.x 5    He has had these symptoms for few years.  He has untreated sleep apnea.   He denies any dysuria, hematuria or suprapubic pain.  He also denies any recent fevers, chills, nausea or vomiting.  He does not have a family history of PCa.      IPSS    [place] Name 10/23/15 1000         International Prostate Symptom Score   How often have you had the sensation of not emptying your bladder? More than half the time     How often have you had to urinate less than every two hours? Almost always     How often have you found you stopped and started again several times when you urinated? Less than 1 in 5 times     How often have you found it difficult to postpone urination? Not at All     How often have you had a  weak urinary stream? About half the time     How often have you had to strain to start urination? More than half the time     How many times did you typically get up at night to urinate? 5 Times     Total IPSS Score 22       Quality of Life due to urinary symptoms   If you were to spend the rest of your life with your urinary condition just the way it is now how would you feel about that? Terrible        Score:  1-7 Mild 8-19 Moderate 20-35 Severe  PMH: Past Medical History:  Diagnosis Date  . BPH (benign prostatic hyperplasia)   . Diabetes ([place])   . Dysuria   . GERD (gastroesophageal reflux disease)   . HTN (hypertension)   . Impotence   . Nocturia   . Obesity   . Sleep apnea   . Testicular hypofunction     Surgical History: Past Surgical History:  Procedure Laterality Date  . CHOLECYSTECTOMY    . VASECTOMY      Home Medications:    Medication List       Accurate as of 10/23/15 11:17 AM. Always use your most recent med list.          amLODipine 10 MG tablet Commonly known as:  NORVASC Take 10 mg by mouth daily.   atorvastatin 20 MG tablet Commonly known as:  LIPITOR Take 20 mg by mouth daily.   clomiPHENE 50 MG tablet Commonly known as:  CLOMID Take 1/2 tablet daily   diltiazem 240 MG 24 hr tablet Commonly known as:  CARDIZEM LA Take 240 mg by mouth daily. Reported on 03/14/2015   omeprazole 20 MG capsule Commonly known as:  PRILOSEC Take 20 mg by mouth daily.   sitaGLIPtin-metformin 50-1000 MG tablet Commonly known as:  JANUMET Take 1 tablet by mouth 2 (two) times daily with a meal. Reported on 03/14/2015   tadalafil 5 MG tablet Commonly known as:  CIALIS Take one tablet daily for BPH       Allergies: No Known Allergies  Family History: Family History  Problem Relation Age of Onset  . Hyperlipidemia Mother   . Diabetes Mellitus II Mother   . Seizures Mother   . Diabetes Father   .  Kidney disease Neg Hx   . Prostate cancer Neg Hx       Social History:  reports that he has been smoking Cigarettes.  He has been smoking about 1.00 pack per day. He has never used smokeless tobacco. He reports that he does not drink alcohol or use drugs.  ROS: UROLOGY Frequent Urination?: Yes Hard to postpone urination?: No Burning/pain with urination?: No Get up at night to urinate?: Yes Leakage of urine?: No Urine stream starts and stops?: No Trouble starting stream?: No Do you have to strain to urinate?: No Blood in urine?: No Urinary tract infection?: No Sexually transmitted disease?: No Injury to kidneys or bladder?: No Painful intercourse?: No Weak stream?: No Erection problems?: Yes Penile pain?: No  Gastrointestinal Nausea?: No Vomiting?: No Indigestion/heartburn?: No Diarrhea?: No Constipation?: No  Constitutional Fever: No Night sweats?: No Weight loss?: No Fatigue?: Yes  Skin Skin rash/lesions?: No Itching?: No  Eyes Blurred vision?: No Double vision?: No  Ears/Nose/Throat Sore throat?: No Sinus problems?: No  Hematologic/Lymphatic Swollen glands?: No Easy bruising?: No  Cardiovascular Leg swelling?: No Chest pain?: No  Respiratory Cough?: No Shortness of breath?: No  Endocrine Excessive thirst?: No  Musculoskeletal Back pain?: No Joint pain?: No  Neurological Headaches?: No Dizziness?: No  Psychologic Depression?: No Anxiety?: No  Physical Exam: BP (!) 158/115   Pulse 83   Ht 6\' 1"  (1.854 m)   Wt 239 lb (108.4 kg)   BMI 31.53 kg/m   Constitutional: Well nourished. Alert and oriented, No acute distress. HEENT: [place] AT, moist mucus membranes. Trachea midline, no masses. Cardiovascular: No clubbing, cyanosis, or edema. Respiratory: Normal respiratory effort, no increased work of breathing. GI: Abdomen is soft, non tender, non distended, no abdominal masses. Liver and spleen not palpable.  No hernias appreciated.  Stool sample for occult testing is not indicated.   GU: No  CVA tenderness.  No bladder fullness or masses.  Patient with circumcised phallus.  Urethral meatus is patent.  No penile discharge. No penile lesions or rashes. Scrotum without lesions, cysts, rashes and/or edema.  Testicles are located scrotally bilaterally. No masses are appreciated in the testicles. Left and right epididymis are normal. Rectal: Patient with  normal sphincter tone. Anus and perineum without scarring or rashes. No rectal masses are appreciated. Prostate is approximately 45 grams, no nodules are appreciated. Seminal vesicles are normal. Skin: No rashes, bruises or suspicious lesions. Lymph: No cervical or inguinal adenopathy. Neurologic: Grossly intact, no focal deficits, moving all 4 extremities. Psychiatric: Normal mood and affect.  Laboratory Data: Lab Results  Component Value Date   WBC 8.2 05/02/2014   HGB 16.1 05/02/2014   HCT 48.0 05/02/2014   MCV 87 05/02/2014   PLT 181 05/02/2014    Lab Results  Component Value Date   CREATININE 1.02 05/11/2012    Lab Results  Component Value Date   AST 34 05/10/2012   Lab Results  Component Value Date   ALT 45 05/10/2012   PSA was 0.3 ng/mL in 04/2015  Pertinent Imaging Results for MORDCHE, PARPART (MRN [id number]) as of 10/28/2015 20:09  Ref. Range 10/23/2015 10:58  Scan Result Unknown 1ml    Assessment & Plan:    1. Hypogonadism:     -most recent testosterone level is 450 ng/dL on 05/24/2015- afternoon draw by employer  -continue Clomid 50 mg, 1/2 tablet daily-refill given  -Patient will have employer draw a morning testosterone (before 10 AM), HCT, PSA and LFT's  2. BPH  with LUTS  - IPSS score is 22/6, it is worsening  - Continue conservative management, avoiding bladder irritants and timed voiding's  - Advised the patient to speak with his PCP about undergoing another sleep study or a different mask  - RTC in 6 months for IPSS, PSA, PVR and exam, as testosterone therapy can cause prostate enlargement  and worsen LUTS  3. Erectile dysfunction:     -SHIM score is 16  -encourage patient to obtain another sleep study or a new mask for his sleep apnea  -RTC in 6 months for SHIM score and exam, as testosterone therapy can affect erections  4. Nocturia  - I explained to the patient that nocturia is often multi-factorial and difficult to treat.  Sleeping disorders, heart conditions and peripheral vascular disease, diabetes,  enlarged prostate or urethral stricture causing bladder outlet obstruction and/or certain medications.  - I have suggested that the patient avoid caffeine and alcohol in the evening. He may also benefit from fluid restrictions after 6:00 in the evening and voiding just prior to bedtime.  - I have explained to the patient that research studies have showed that over 84% of patients with sleep apnea reported frequent nighttime urination.   With sleep apnea, oxygen decreases, carbon dioxide increases, the blood become more acidic, the heart rate drops and blood vessels in the lung constrict.  The body is then alerted that something is very wrong. The sleeper must wake enough to reopen the airway. By this time, the heart is racing and experiences a false signal of fluid overload. The heart excretes a hormone-like protein that tells the body to get rid of sodium and water, resulting in nocturia.  - The patient may also benefit from a discussion with his primary care physician to see if he needs a new sleep study and/or a new mask    Return in about 6 months (around 04/21/2016) for IPSS, ADAM, SHIM and exam.  These notes generated with voice recognition software. I apologize for typographical errors.  Zara Council, [facility] [address], [place], [place] [address] [phone]

## 2015-10-30 ENCOUNTER — Other Ambulatory Visit: Payer: Self-pay

## 2015-11-02 ENCOUNTER — Emergency Department: Payer: BLUE CROSS/BLUE SHIELD

## 2015-11-02 ENCOUNTER — Emergency Department
Admission: EM | Admit: 2015-11-02 | Discharge: 2015-11-02 | Disposition: A | Discharge status: Home / Self Care | Payer: BLUE CROSS/BLUE SHIELD | Attending: Student in an Organized Health Care Education/Training Program | Admitting: Student in an Organized Health Care Education/Training Program

## 2015-11-02 ENCOUNTER — Encounter: Payer: Self-pay | Admitting: Radiology

## 2015-11-02 DIAGNOSIS — F17210 Nicotine dependence, cigarettes, uncomplicated: Secondary | ICD-10-CM | POA: Insufficient documentation

## 2015-11-02 DIAGNOSIS — R0789 Other chest pain: Secondary | ICD-10-CM | POA: Diagnosis present

## 2015-11-02 DIAGNOSIS — Z79899 Other long term (current) drug therapy: Secondary | ICD-10-CM | POA: Diagnosis not present

## 2015-11-02 DIAGNOSIS — I10 Essential (primary) hypertension: Secondary | ICD-10-CM | POA: Diagnosis not present

## 2015-11-02 DIAGNOSIS — E119 Type 2 diabetes mellitus without complications: Secondary | ICD-10-CM | POA: Insufficient documentation

## 2015-11-02 DIAGNOSIS — R079 Chest pain, unspecified: Secondary | ICD-10-CM

## 2015-11-02 LAB — COMPREHENSIVE METABOLIC PANEL
ALK PHOS: 73 U/L (ref 38–126)
ALT: 43 U/L (ref 17–63)
AST: 40 U/L (ref 15–41)
Albumin: 4 g/dL (ref 3.5–5.0)
Anion gap: 6 (ref 5–15)
BUN: 11 mg/dL (ref 6–20)
CHLORIDE: 104 mmol/L (ref 101–111)
CO2: 24 mmol/L (ref 22–32)
CREATININE: 0.87 mg/dL (ref 0.61–1.24)
Calcium: 9.1 mg/dL (ref 8.9–10.3)
GFR calc Af Amer: >60 mL/min (ref >60)
Glucose, Bld: 246 mg/dL — ABNORMAL HIGH (ref 65–99)
Potassium: 4 mmol/L (ref 3.5–5.1)
Sodium: 134 mmol/L — ABNORMAL LOW (ref 135–145)
Total Bilirubin: 0.7 mg/dL (ref 0.3–1.2)
Total Protein: 7 g/dL (ref 6.5–8.1)

## 2015-11-02 LAB — CBC
HCT: 46.8 % (ref 40.0–52.0)
Hemoglobin: 16.4 g/dL (ref 13.0–18.0)
MCH: 31.2 pg (ref 26.0–34.0)
MCHC: 35.1 g/dL (ref 32.0–36.0)
MCV: 88.7 fL (ref 80.0–100.0)
PLATELETS: 171 10*3/uL (ref 150–440)
RBC: 5.27 MIL/uL (ref 4.40–5.90)
RDW: 13.7 % (ref 11.5–14.5)
WBC: 9.6 10*3/uL (ref 3.8–10.6)

## 2015-11-02 LAB — TROPONIN I
Troponin I: <0.03 ng/mL (ref <0.03)
Troponin I: <0.03 ng/mL (ref <0.03)

## 2015-11-02 MED ORDER — IOPAMIDOL (ISOVUE-370) INJECTION 76%
100.0000 mL | Freq: Once | INTRAVENOUS | Status: AC | PRN
  Administered 2015-11-02: 100 mL via INTRAVENOUS

## 2015-11-02 NOTE — ED Provider Notes (Signed)
Second troponin negative. No recurrence of symptoms. Workup reassuring. Vital signs reassuring, blood pressure improved. We'll discharge home.   Carrie Mew, MD 11/02/15 [phone]

## 2015-11-02 NOTE — ED Triage Notes (Signed)
Pt arrives today with reports of chest pain that began last night  "When I got home from work I could feel my heart pounding -  then this am I have been hurting from the middle of my chest  over my shoulder and into my back - something's not right."    Elevated BP in triage  Pt denies pain at this time

## 2015-11-02 NOTE — ED Provider Notes (Signed)
[facility] Emergency Department Provider Note    None    (approximate)  I have reviewed the triage vital signs and the nursing notes.   HISTORY  Chief Complaint Chest Pain    HPI Ronald Fels. is a 45 y.o. male presents with chest discomfort that started last night. Patient noted that he did have some shortness of breath and chest pain over his left anterior chest wall radiating to his left shoulder. No diaphoresis. No radiation of the right jaw or right shoulder. No nausea or vomiting. States he checked his blood pressure was elevated. He did not have any exertional shortness of breath. Has never had this kind of discomfort before. Work this morning and otherwise is feeling well and is complaining of some soreness in the left upper chest. Went to the nurse's office and his blood pressure checked and it was 190/100 therefore he came to the ER for further evaluation. In triage she had even higher blood pressure reading which he states made his chest hurt worse. Initial EKG and lab work was done in triage. He currently denies any discomfort. No headache no visual disturbance.   Past Medical History:  Diagnosis Date  . BPH (benign prostatic hyperplasia)   . Diabetes ([place])   . Dysuria   . GERD (gastroesophageal reflux disease)   . HTN (hypertension)   . Impotence   . Nocturia   . Obesity   . Sleep apnea   . Testicular hypofunction     Patient Active Problem List   Diagnosis Date Noted  . Erectile dysfunction of organic origin 03/15/2015  . Hypogonadism in male 03/15/2015  . BPH with obstruction/lower urinary tract symptoms 03/15/2015  . Encounter for long-term (current) use of other medications 06/15/2012    Past Surgical History:  Procedure Laterality Date  . CHOLECYSTECTOMY    . VASECTOMY      Prior to Admission medications   Medication Sig Start Date End Date Taking? Authorizing Provider  amLODipine (NORVASC) 10 MG tablet Take 10 mg  by mouth daily.    Historical Provider, MD  atorvastatin (LIPITOR) 20 MG tablet Take 20 mg by mouth daily.    Historical Provider, MD  clomiPHENE (CLOMID) 50 MG tablet Take 1/2 tablet daily 10/23/15   Larene Beach A McGowan, PA-C  diltiazem (CARDIZEM LA) 240 MG 24 hr tablet Take 240 mg by mouth daily. Reported on 03/14/2015    Historical Provider, MD  omeprazole (PRILOSEC) 20 MG capsule Take 20 mg by mouth daily.    Historical Provider, MD  sitaGLIPtin-metformin (JANUMET) 50-1000 MG per tablet Take 1 tablet by mouth 2 (two) times daily with a meal. Reported on 03/14/2015    Historical Provider, MD  tadalafil (CIALIS) 5 MG tablet Take one tablet daily for BPH 03/14/15   Larene Beach A McGowan, PA-C    Allergies Review of patient's allergies indicates no known allergies.  Family History  Problem Relation Age of Onset  . Hyperlipidemia Mother   . Diabetes Mellitus II Mother   . Seizures Mother   . Diabetes Father   . Kidney disease Neg Hx   . Prostate cancer Neg Hx     Social History Social History  Substance Use Topics  . Smoking status: Current Every Day Smoker    Packs/day: 1.00    Types: Cigarettes  . Smokeless tobacco: Never Used  . Alcohol use No    Review of Systems Patient denies headaches, rhinorrhea, blurry vision, numbness, shortness of breath, chest pain,  edema, cough, abdominal pain, nausea, vomiting, diarrhea, dysuria, fevers, rashes or hallucinations unless otherwise stated above in HPI. ____________________________________________   PHYSICAL EXAM:  VITAL SIGNS: Vitals:   11/02/15 1029 11/02/15 1248  BP: (!) 206/121 (!) 147/102  Pulse: 95 95  Resp: 18   Temp: 98 F (36.7 C)     Constitutional: Alert and oriented. Well appearing and in no acute distress. Eyes: Conjunctivae are normal. PERRL. EOMI. Head: Atraumatic. Nose: No congestion/rhinnorhea. Mouth/Throat: Mucous membranes are moist.  Oropharynx non-erythematous. Neck: No stridor. Painless ROM. No cervical spine  tenderness to palpation Hematological/Lymphatic/Immunilogical: No cervical lymphadenopathy. Cardiovascular: Normal rate, regular rhythm. Grossly normal heart sounds.  Good peripheral circulation. Respiratory: Normal respiratory effort.  No retractions. Lungs CTAB. Gastrointestinal: Soft and nontender. No distention. No abdominal bruits. No CVA tenderness. Genitourinary:  Musculoskeletal: No lower extremity tenderness nor edema.  No joint effusions. Neurologic:  Normal speech and language. No gross focal neurologic deficits are appreciated. No gait instability. Skin:  Skin is warm, dry and intact. No rash noted. Psychiatric: Mood and affect are normal. Speech and behavior are normal.  ____________________________________________   LABS (all labs ordered are listed, but only abnormal results are displayed)  Results for orders placed or performed during the hospital encounter of 11/02/15 (from the past 24 hour(s))  Comprehensive metabolic panel     Status: Abnormal   Collection Time: 11/02/15 10:32 AM  Result Value Ref Range   Sodium 134 (L) 135 - 145 mmol/L   Potassium 4.0 3.5 - 5.1 mmol/L   Chloride 104 101 - 111 mmol/L   CO2 24 22 - 32 mmol/L   Glucose, Bld 246 (H) 65 - 99 mg/dL   BUN 11 6 - 20 mg/dL   Creatinine, Ser 0.87 0.61 - 1.24 mg/dL   Calcium 9.1 8.9 - 10.3 mg/dL   Total Protein 7.0 6.5 - 8.1 g/dL   Albumin 4.0 3.5 - 5.0 g/dL   AST 40 15 - 41 U/L   ALT 43 17 - 63 U/L   Alkaline Phosphatase 73 38 - 126 U/L   Total Bilirubin 0.7 0.3 - 1.2 mg/dL   GFR calc non Af Amer >60 >60 mL/min   GFR calc Af Amer >60 >60 mL/min   Anion gap 6 5 - 15  CBC     Status: None   Collection Time: 11/02/15 10:32 AM  Result Value Ref Range   WBC 9.6 3.8 - 10.6 K/uL   RBC 5.27 4.40 - 5.90 MIL/uL   Hemoglobin 16.4 13.0 - 18.0 g/dL   HCT 46.8 40.0 - 52.0 %   MCV 88.7 80.0 - 100.0 fL   MCH 31.2 26.0 - 34.0 pg   MCHC 35.1 32.0 - 36.0 g/dL   RDW 13.7 11.5 - 14.5 %   Platelets 171 150 - 440  K/uL  Troponin I     Status: None   Collection Time: 11/02/15 10:32 AM  Result Value Ref Range   Troponin I <0.03 <0.03 ng/mL   ____________________________________________  EKG My review and personal interpretation at Time: 10:27   Indication: chest pain  Rate: 91  Rhythm: nsr Axis: rightward Other: no acute ischemic changes ____________________________________________  RADIOLOGY  I personally reviewed all radiographic images ordered to evaluate for the above acute complaints and reviewed radiology reports and findings.  These findings were personally discussed with the patient.  Please see medical record for radiology report. ____________________________________________   PROCEDURES  Procedure(s) performed: none    Critical Care performed: no ____________________________________________  INITIAL IMPRESSION / ASSESSMENT AND PLAN / ED COURSE  Pertinent labs & imaging results that were available during my care of the patient were reviewed by me and considered in my medical decision making (see chart for details).  DDX: ACS, pericarditis, esophagitis, boerhaaves, pe, dissection, pna, bronchitis, costochondritis   Ronald Pruitt. is a 45 y.o. who presents to the ED with elevated blood pressure and chest discomfort.  Patient is AFVSS in ED. Exam as above. Given current presentation have considered the above differential. EKG shows no evidence of acute ischemia patient's currently chest pain-free troponin is negative as pain has been ongoing for over 12 hours do not feel is currently consistent with ACS. Based on pain radiating to his shoulder with hypertension will evaluate for dissection or aneurysm with CTA chest. Chest x-ray shows evidence of pneumonia or pneumothorax. Patient is low risk [place] and PERC negative. The patient will be placed on continuous pulse oximetry and telemetry for monitoring.  Laboratory evaluation will be sent to evaluate for the above complaints.       Clinical Course  Value Comment By Time  BUN: 11 (Reviewed) Merlyn Lot, MD 10/06 1419   ----------------------------------------- 4:01 PM on 11/02/2015 -----------------------------------------  Patient reassessed remains chest pain-free. His CT angiogram shows no evidence of acute abnormality. Patient did have some bile signs recorded with a lower pulse ox but the patient has no hypoxia on room air on my exam with a good pulse oximetry wave at 98%. No respiratory distress. Do feel patient is appropriate for outpatient follow-up. The patient will be signed out to Dr. Joni Fears pending repeat troponin.  I anticipate discharge. Have discussed with the patient and available family all diagnostics and treatments performed thus far and all questions were answered to the best of my ability. The patient demonstrates understanding and agreement with plan.    ____________________________________________   FINAL CLINICAL IMPRESSION(S) / ED DIAGNOSES  Final diagnoses:  Chest pain, unspecified type  Hypertension, unspecified type      NEW MEDICATIONS STARTED DURING THIS VISIT:  New Prescriptions   No medications on file     Note:  This document was prepared using Dragon voice recognition software and may include unintentional dictation errors.    Merlyn Lot, MD 11/02/15 [phone]

## 2015-12-07 ENCOUNTER — Ambulatory Visit: Payer: BLUE CROSS/BLUE SHIELD | Attending: Otolaryngology

## 2015-12-07 DIAGNOSIS — G4733 Obstructive sleep apnea (adult) (pediatric): Secondary | ICD-10-CM | POA: Insufficient documentation

## 2016-01-04 ENCOUNTER — Other Ambulatory Visit: Payer: Self-pay

## 2016-04-03 ENCOUNTER — Other Ambulatory Visit: Payer: Self-pay

## 2016-04-03 DIAGNOSIS — N529 Male erectile dysfunction, unspecified: Secondary | ICD-10-CM

## 2016-04-03 MED ORDER — TADALAFIL 20 MG PO TABS: 20.0000 mg | ORAL_TABLET | Freq: Every day | ORAL | 0 refills | Status: DC | PRN

## 2016-04-20 NOTE — Progress Notes (Signed)
04/21/2016 9:34 AM   Ronald Pruitt. Dec 29, 1970 [id number]  Referring provider: No referring provider defined for this encounter.  Chief Complaint  Patient presents with  . Hypogonadism    6 month follow up     HPI: Patient is a 46 year old Caucasian male with hypogonadism, ED and BPH with LU TS who presents for a 6 months follow up.    Hypogonadism Patient is experiencing a decrease in libido, a lack of energy, a decrease in strength, sadness and/or grumpiness, erections being less strong and falling asleep after dinner.  This is indicated by his responses to the ADAM questionnaire.  He is not having spontaneous erections at night.  He has sleep apnea and is sleeping with a CPAP machine for two months.  He is currently managing his hypogonadism with Clomid 50 mg, 1/2 daily.  His most recent testosterone was 450 ng/dL in 04/2015 and this was an afternoon draw.       Androgen Deficiency in the Aging Male    Ronald Pruitt 04/21/16 0900         Androgen Deficiency in the Aging Male   Do you have a decrease in libido (sex drive) Yes     Do you have lack of energy Yes     Do you have a decrease in strength and/or endurance Yes     Have you lost height No     Have you noticed a decreased "enjoyment of life" No     Are you sad and/or grumpy Yes     Are your erections less strong Yes     Have you noticed a recent deterioration in your ability to play sports No     Are you falling asleep after dinner Yes     Has there been a recent deterioration in your work performance No       Erectile dysfunction His SHIM score is 9, which is moderate ED.   His previous SHIM score was 16.  He has been having difficulty with erections for the last few years.   His major complaint is confidence to achieve an erection.  His libido is preserved.   His risk factors for ED are age, smoking, BPH, hypogonadism, sleep apnea, hypertension and hyperlipidemia.  He denies any painful erections or curvatures  with his erections.   He has tried Cialis in the past with good results, but he is finding cost prohibitive.  He is  sleeping with his CPAP machine.        SHIM    Ronald Pruitt 04/21/16 [phone]         SHIM: Over the last 6 months:   How do you rate your confidence that you could get and keep an erection? Very Low     When you had erections with sexual stimulation, how often were your erections hard enough for penetration (entering your partner)? A Few Times (much less than half the time)     During sexual intercourse, how often were you able to maintain your erection after you had penetrated (entered) your partner? A Few Times (much less than half the time)     During sexual intercourse, how difficult was it to maintain your erection to completion of intercourse? Very Difficult     When you attempted sexual intercourse, how often was it satisfactory for you? A Few Times (much less than half the time)       SHIM Total Score   SHIM 9  Score: 1-7 Severe ED 8-11 Moderate ED 12-16 Mild-Moderate ED 17-21 Mild ED 22-25 No ED  BPH WITH LUTS His IPSS score today is 17, which is moderate lower urinary tract symptomatology. He is unhappy with his quality life due to his urinary symptoms.  His PVR is 0 mL.  His previous IPSS score was 22/6.  His major complaints today are frequency, nocturia and straining to urinate.  He has had these symptoms for few years.  He has untreated sleep apnea.   He denies any dysuria, hematuria or suprapubic pain.  He also denies any recent fevers, chills, nausea or vomiting.  He does not have a family history of PCa.      IPSS    Ronald Pruitt 04/21/16 0900         International Prostate Symptom Score   How often have you had the sensation of not emptying your bladder? Less than half the time     How often have you had to urinate less than every two hours? Almost always     How often have you found you stopped and started again several times when you urinated?  Less than 1 in 5 times     How often have you found it difficult to postpone urination? About half the time     How often have you had a weak urinary stream? Less than half the time     How often have you had to strain to start urination? Less than half the time     How many times did you typically get up at night to urinate? 2 Times     Total IPSS Score 17       Quality of Life due to urinary symptoms   If you were to spend the rest of your life with your urinary condition just the way it is now how would you feel about that? Unhappy        Score:  1-7 Mild 8-19 Moderate 20-35 Severe  PMH: Past Medical History:  Diagnosis Date  . BPH (benign prostatic hyperplasia)   . Diabetes ([place])   . Dysuria   . GERD (gastroesophageal reflux disease)   . HTN (hypertension)   . Impotence   . Nocturia   . Obesity   . Sleep apnea   . Testicular hypofunction     Surgical History: Past Surgical History:  Procedure Laterality Date  . CHOLECYSTECTOMY    . VASECTOMY      Home Medications:  Allergies as of 04/21/2016   No Known Allergies     Medication List       Accurate as of 04/21/16  9:34 AM. Always use your most recent med list.          amLODipine 10 MG tablet Commonly known as:  NORVASC Take 10 mg by mouth daily.   atorvastatin 20 MG tablet Commonly known as:  LIPITOR Take 20 mg by mouth daily.   clomiPHENE 50 MG tablet Commonly known as:  CLOMID Take 1/2 tablet daily   diltiazem 240 MG 24 hr tablet Commonly known as:  CARDIZEM LA Take 240 mg by mouth daily. Reported on 03/14/2015   JARDIANCE 25 MG Tabs tablet Generic drug:  empagliflozin Take 25 mg by mouth daily.   omeprazole 20 MG capsule Commonly known as:  PRILOSEC Take 20 mg by mouth daily.   sitaGLIPtin-metformin 50-1000 MG tablet Commonly known as:  JANUMET Take 1 tablet by mouth daily. Reported on 03/14/2015   tadalafil 5  MG tablet Commonly known as:  CIALIS Take one tablet daily for BPH     tadalafil 20 MG tablet Commonly known as:  CIALIS Take 1 tablet (20 mg total) by mouth daily as needed for erectile dysfunction.       Allergies: No Known Allergies  Family History: Family History  Problem Relation Age of Onset  . Hyperlipidemia Mother   . Diabetes Mellitus II Mother   . Seizures Mother   . Diabetes Father   . Kidney disease Neg Hx   . Prostate cancer Neg Hx   . Kidney cancer Neg Hx   . Bladder Cancer Neg Hx     Social History:  reports that he has been smoking Cigarettes.  He has been smoking about 1.00 pack per day. He has never used smokeless tobacco. He reports that he does not drink alcohol or use drugs.  ROS: UROLOGY Frequent Urination?: Yes Hard to postpone urination?: No Burning/pain with urination?: No Get up at night to urinate?: Yes Leakage of urine?: No Urine stream starts and stops?: No Trouble starting stream?: No Do you have to strain to urinate?: Yes Blood in urine?: No Urinary tract infection?: No Sexually transmitted disease?: No Injury to kidneys or bladder?: No Painful intercourse?: No Weak stream?: No Erection problems?: Yes Penile pain?: No  Gastrointestinal Nausea?: No Vomiting?: No Indigestion/heartburn?: No Diarrhea?: No Constipation?: No  Constitutional Fever: No Night sweats?: No Weight loss?: No Fatigue?: Yes  Skin Skin rash/lesions?: No Itching?: No  Eyes Blurred vision?: No Double vision?: No  Ears/Nose/Throat Sore throat?: No Sinus problems?: No  Hematologic/Lymphatic Swollen glands?: No Easy bruising?: No  Cardiovascular Leg swelling?: No Chest pain?: No  Respiratory Cough?: No Shortness of breath?: No  Endocrine Excessive thirst?: No  Musculoskeletal Back pain?: No Joint pain?: No  Neurological Headaches?: No Dizziness?: No  Psychologic Depression?: No Anxiety?: No  Physical Exam: BP (!) 154/92   Pulse 90   Ht 6' (1.829 m)   Wt 233 lb 6.4 oz (105.9 kg)   BMI 31.65  kg/m   Constitutional: Well nourished. Alert and oriented, No acute distress. HEENT: [place] AT, moist mucus membranes. Trachea midline, no masses. Cardiovascular: No clubbing, cyanosis, or edema. Respiratory: Normal respiratory effort, no increased work of breathing. GI: Abdomen is soft, non tender, non distended, no abdominal masses. Liver and spleen not palpable.  No hernias appreciated.  Stool sample for occult testing is not indicated.   GU: No CVA tenderness.  No bladder fullness or masses.  Patient with circumcised phallus.  Urethral meatus is patent.  No penile discharge. No penile lesions or rashes. Scrotum without lesions, cysts, rashes and/or edema.  Testicles are located scrotally bilaterally. No masses are appreciated in the testicles. Left and right epididymis are normal. Rectal: Patient with  normal sphincter tone. Anus and perineum without scarring or rashes. No rectal masses are appreciated. Prostate is approximately 45 grams, no nodules are appreciated. Seminal vesicles are normal. Skin: No rashes, bruises or suspicious lesions. Lymph: No cervical or inguinal adenopathy. Neurologic: Grossly intact, no focal deficits, moving all 4 extremities. Psychiatric: Normal mood and affect.  Laboratory Data: Lab Results  Component Value Date   WBC 9.6 11/02/2015   HGB 16.4 11/02/2015   HCT 46.8 11/02/2015   MCV 88.7 11/02/2015   PLT 171 11/02/2015    Lab Results  Component Value Date   CREATININE 0.87 11/02/2015    Lab Results  Component Value Date   AST 40 11/02/2015   Lab Results  Component Value Date   ALT 43 11/02/2015   PSA was 0.3 ng/mL in 04/2015  Pertinent Imaging Results for URIJAH, ARKO (MRN [id number]) as of 04/21/2016 09:33  Ref. Range 04/21/2016 09:30  Scan Result Unknown 0     Assessment & Plan:    1. Hypogonadism:     -most recent testosterone level is 450 ng/dL on 05/24/2015- afternoon draw by employer  -continue Clomid 50 mg, 1/2 tablet  daily-refill given  -Patient had a morning testosterone (before 10 AM), HCT, PSA and LFT's drawn today  2. BPH with LUTS  - IPSS score is 17/5, it is improving  - Continue conservative management, avoiding bladder irritants and timed voiding's  - Advised the patient to speak with his PCP about undergoing another sleep study or a different mask  - RTC in 6 months for IPSS, PSA, PVR and exam, as testosterone therapy can cause prostate enlargement and worsen LU TS    3. Erectile dysfunction:     -SHIM score is 9  -encourage patient to obtain another sleep study or a new mask for his sleep apnea  -RTC in 6 months for SHIM score and exam, as testosterone therapy can affect erections  4. Nocturia  - he is sleeping with his CPAP machine  Return in about 6 months (around 10/22/2016) for PSA, LFT's, HCT, testosterone (before 10 AM), ADAM, IPSS, SHIM and exam.  These notes generated with voice recognition software. I apologize for typographical errors.  Zara Council, [facility] [address], [place], [place] [address] [phone]

## 2016-04-21 ENCOUNTER — Encounter: Payer: Self-pay | Admitting: Urology

## 2016-04-21 ENCOUNTER — Ambulatory Visit: Payer: BLUE CROSS/BLUE SHIELD | Admitting: Urology

## 2016-04-21 VITALS — BP 154/92 | HR 90 | Ht 72.0 in | Wt 233.4 lb

## 2016-04-21 DIAGNOSIS — R351 Nocturia: Secondary | ICD-10-CM | POA: Diagnosis not present

## 2016-04-21 DIAGNOSIS — E291 Testicular hypofunction: Secondary | ICD-10-CM

## 2016-04-21 DIAGNOSIS — N529 Male erectile dysfunction, unspecified: Secondary | ICD-10-CM | POA: Diagnosis not present

## 2016-04-21 DIAGNOSIS — N401 Enlarged prostate with lower urinary tract symptoms: Secondary | ICD-10-CM | POA: Diagnosis not present

## 2016-04-21 DIAGNOSIS — N138 Other obstructive and reflux uropathy: Secondary | ICD-10-CM

## 2016-04-21 LAB — BLADDER SCAN AMB NON-IMAGING: SCAN RESULT: 0

## 2016-04-21 MED ORDER — CLOMIPHENE CITRATE 50 MG PO TABS: ORAL_TABLET | ORAL | 3 refills | Status: DC

## 2016-04-22 ENCOUNTER — Telehealth: Payer: Self-pay

## 2016-04-22 LAB — HEPATIC FUNCTION PANEL
ALK PHOS: 82 IU/L (ref 39–117)
ALT: 26 IU/L (ref 0–44)
AST: 18 IU/L (ref 0–40)
Albumin: 4.2 g/dL (ref 3.5–5.5)
Bilirubin Total: 0.3 mg/dL (ref 0.0–1.2)
Bilirubin, Direct: 0.19 mg/dL (ref 0.00–0.40)
Total Protein: 7.3 g/dL (ref 6.0–8.5)

## 2016-04-22 LAB — HEMATOCRIT: Hematocrit: 48.8 % (ref 37.5–51.0)

## 2016-04-22 LAB — PSA: PROSTATE SPECIFIC AG, SERUM: 0.3 ng/mL (ref 0.0–4.0)

## 2016-04-22 LAB — TESTOSTERONE: Testosterone: 394 ng/dL (ref 264–916)

## 2016-04-22 NOTE — Telephone Encounter (Signed)
-----   Message from Nori Riis, PA-C sent at 04/22/2016  8:08 AM EDT ----- Please let patient know that his PSA and HCT are normal.  Testosterone is still pending.

## 2016-04-22 NOTE — Telephone Encounter (Signed)
LMOM- all labs are normal.

## 2016-04-22 NOTE — Telephone Encounter (Signed)
-----   Message from Nori Riis, PA-C sent at 04/22/2016  9:49 AM EDT ----- Please let the patient that his testosterone is normal.

## 2016-05-21 ENCOUNTER — Ambulatory Visit (INDEPENDENT_AMBULATORY_CARE_PROVIDER_SITE_OTHER): Payer: BLUE CROSS/BLUE SHIELD | Admitting: Urology

## 2016-05-21 ENCOUNTER — Encounter: Payer: Self-pay | Admitting: Urology

## 2016-05-21 VITALS — BP 126/79 | HR 85 | Ht 72.0 in | Wt 239.1 lb

## 2016-05-21 DIAGNOSIS — N138 Other obstructive and reflux uropathy: Secondary | ICD-10-CM

## 2016-05-21 DIAGNOSIS — R351 Nocturia: Secondary | ICD-10-CM | POA: Diagnosis not present

## 2016-05-21 DIAGNOSIS — R350 Frequency of micturition: Secondary | ICD-10-CM | POA: Diagnosis not present

## 2016-05-21 DIAGNOSIS — N401 Enlarged prostate with lower urinary tract symptoms: Secondary | ICD-10-CM | POA: Diagnosis not present

## 2016-05-21 LAB — URINALYSIS, COMPLETE
Bilirubin, UA: NEGATIVE
KETONES UA: NEGATIVE
LEUKOCYTES UA: NEGATIVE
NITRITE UA: NEGATIVE
PH UA: 6 (ref 5.0–7.5)
Protein, UA: NEGATIVE
RBC UA: NEGATIVE
SPEC GRAV UA: 1.01 (ref 1.005–1.030)
UUROB: 0.2 mg/dL (ref 0.2–1.0)

## 2016-05-21 LAB — BLADDER SCAN AMB NON-IMAGING: Scan Result: 42

## 2016-05-21 NOTE — Progress Notes (Signed)
05/21/2016 10:16 AM   Ronald Pruitt. 1970/02/18 [id number]  Referring provider: Serita Butcher, [place], [place] [address]  Chief Complaint  Patient presents with  . Urinary Frequency    Started CPAP almost 3 months ago and getting up 3-5 times a night    HPI: Patient is a 46 year old Caucasian male with hypogonadism, ED and BPH with LU TS who presents today requesting an urgent appointment for frequency.       BPH WITH LUTS His IPSS score today is 25, which is severe lower urinary tract symptomatology. He is terrible with his quality life due to his urinary symptoms.  His PVR is 42 mL.  His previous IPSS score was 17/5.  His major complaints today are frequency, nocturia and straining to urinate.  His UA today is unremarkable.  He has had these symptoms for few years.  He has been sleeping with his CPAP machine for three months.  His BS have been running 150-250.  His last HbgA1c was 10 % in 10/2015.  He denies any dysuria, hematuria or suprapubic pain.  He also denies any recent fevers, chills, nausea or vomiting.  He does not have a family history of PCa.      IPSS    Row Name 04/21/16 0900 05/21/16 0900       International Prostate Symptom Score   How often have you had the sensation of not emptying your bladder? Less than half the time Less than half the time    How often have you had to urinate less than every two hours? Almost always Almost always    How often have you found you stopped and started again several times when you urinated? Less than 1 in 5 times About half the time    How often have you found it difficult to postpone urination? About half the time More than half the time    How often have you had a weak urinary stream? Less than half the time More than half the time    How often have you had to strain to start urination? Less than half the time About half the time    How many times did you typically get up at night to urinate? 2  Times 4 Times    Total IPSS Score 17 25      Quality of Life due to urinary symptoms   If you were to spend the rest of your life with your urinary condition just the way it is now how would you feel about that? Unhappy Terrible       Score:  1-7 Mild 8-19 Moderate 20-35 Severe  PMH: Past Medical History:  Diagnosis Date  . BPH (benign prostatic hyperplasia)   . Diabetes ([place])   . Dysuria   . GERD (gastroesophageal reflux disease)   . HTN (hypertension)   . Impotence   . Nocturia   . Obesity   . Sleep apnea   . Testicular hypofunction     Surgical History: Past Surgical History:  Procedure Laterality Date  . CHOLECYSTECTOMY    . VASECTOMY      Home Medications:  Allergies as of 05/21/2016   No Known Allergies     Medication List       Accurate as of 05/21/16 10:16 AM. Always use your most recent med list.          amLODipine 10 MG tablet Commonly known as:  NORVASC Take 10 mg  by mouth daily.   atorvastatin 20 MG tablet Commonly known as:  LIPITOR Take 20 mg by mouth daily.   clomiPHENE 50 MG tablet Commonly known as:  CLOMID Take 1/2 tablet daily   JARDIANCE 25 MG Tabs tablet Generic drug:  empagliflozin Take 25 mg by mouth daily.   omeprazole 20 MG capsule Commonly known as:  PRILOSEC Take 20 mg by mouth daily.   sitaGLIPtin-metformin 50-1000 MG tablet Commonly known as:  JANUMET Take 1 tablet by mouth daily. Reported on 03/14/2015   tadalafil 20 MG tablet Commonly known as:  CIALIS Take 1 tablet (20 mg total) by mouth daily as needed for erectile dysfunction.       Allergies: No Known Allergies  Family History: Family History  Problem Relation Age of Onset  . Hyperlipidemia Mother   . Diabetes Mellitus II Mother   . Seizures Mother   . Diabetes Father   . Kidney disease Neg Hx   . Prostate cancer Neg Hx   . Kidney cancer Neg Hx   . Bladder Cancer Neg Hx     Social History:  reports that he has been smoking Cigarettes.  He  has been smoking about 1.00 pack per day. He has never used smokeless tobacco. He reports that he does not drink alcohol or use drugs.  ROS: UROLOGY Frequent Urination?: Yes Hard to postpone urination?: No Burning/pain with urination?: No Get up at night to urinate?: Yes Leakage of urine?: No Urine stream starts and stops?: No Trouble starting stream?: No Do you have to strain to urinate?: Yes Blood in urine?: No Urinary tract infection?: No Sexually transmitted disease?: No Injury to kidneys or bladder?: No Painful intercourse?: No Weak stream?: No Erection problems?: Yes Penile pain?: No  Gastrointestinal Nausea?: No Vomiting?: No Indigestion/heartburn?: Yes Diarrhea?: No Constipation?: No  Constitutional Fever: No Night sweats?: No Weight loss?: No Fatigue?: Yes  Skin Skin rash/lesions?: No Itching?: No  Eyes Blurred vision?: Yes Double vision?: No  Ears/Nose/Throat Sore throat?: No Sinus problems?: Yes  Hematologic/Lymphatic Swollen glands?: No Easy bruising?: No  Cardiovascular Leg swelling?: No Chest pain?: No  Respiratory Cough?: No Shortness of breath?: No  Endocrine Excessive thirst?: No  Musculoskeletal Back pain?: Yes Joint pain?: No  Neurological Headaches?: No Dizziness?: No  Psychologic Depression?: No Anxiety?: No  Physical Exam: BP 126/79 (BP Location: Left Arm, Patient Position: Sitting, Cuff Size: Normal)   Pulse 85   Ht 6' (1.829 m)   Wt 239 lb 1.6 oz (108.5 kg)   BMI 32.43 kg/m   Constitutional: Well nourished. Alert and oriented, No acute distress. HEENT: [place] AT, moist mucus membranes. Trachea midline, no masses. Cardiovascular: No clubbing, cyanosis, or edema. Respiratory: Normal respiratory effort, no increased work of breathing. Skin: No rashes, bruises or suspicious lesions. Lymph: No cervical or inguinal adenopathy. Neurologic: Grossly intact, no focal deficits, moving all 4 extremities. Psychiatric:  Normal mood and affect.  Laboratory Data: PSA History  0.3 ng/mL on 04/00/2017  0.3 ng/mL on 04/21/2016  Lab Results  Component Value Date   WBC 9.6 11/02/2015   HGB 16.4 11/02/2015   HCT 48.8 04/21/2016   MCV 88.7 11/02/2015   PLT 171 11/02/2015    Lab Results  Component Value Date   CREATININE 0.87 11/02/2015    Lab Results  Component Value Date   AST 18 04/21/2016   Lab Results  Component Value Date   ALT 26 04/21/2016     Pertinent Imaging Results for Ronald Pruitt, Ronald Pruitt. (  MRN [id number]) as of 05/21/2016 10:18  Ref. Range 05/21/2016 09:57  Scan Result Unknown 42    Assessment & Plan:    1. BPH with LUTS  - IPSS score is 25/6, it is worsening  - Continue conservative management, avoiding bladder irritants and timed voiding's  - his most bothersome symptoms are frequency, urgency and nocturia  - Continue sleeping with CPAP  - I had a frank discussion with the patient concerning his DM and how it contributes to bladder malfunction and encouraged to continue working with Ms. Rolena Infante to get it under tighter control  - offered medication - will start Myrbetriq 50 mg daily, # 28 samples - I have advised the patient of the side effects of Myrbetriq, such as: elevation in BP, urinary retention and/or HA.  - RTC in 3 weeks for I PSS and PVR   2. Testosterone deficiency  (Not addressed at this visit)  3. Erectile dysfunction:     (Not addressed at this visit)  4. Nocturia  - he is sleeping with his CPAP machine  Return in about 3 weeks (around 06/11/2016) for IPSS and PVR.  These notes generated with voice recognition software. I apologize for typographical errors.  Ronald Pruitt, [facility] [address] [place],  [address] [phone]

## 2016-05-23 ENCOUNTER — Other Ambulatory Visit: Payer: Self-pay | Admitting: Urology

## 2016-05-23 DIAGNOSIS — N529 Male erectile dysfunction, unspecified: Secondary | ICD-10-CM

## 2016-05-26 MED ORDER — TADALAFIL 20 MG PO TABS: 20.0000 mg | ORAL_TABLET | Freq: Every day | ORAL | 0 refills | Status: DC | PRN

## 2016-05-29 ENCOUNTER — Other Ambulatory Visit: Payer: Self-pay

## 2016-06-10 NOTE — Progress Notes (Signed)
06/12/2016 3:58 PM   Ronald Pruitt. [birth year] [id number]  Referring provider: Serita Butcher, [address], [place] [address]  Chief Complaint  Patient presents with  . Benign Prostatic Hypertrophy    3 week follow up   . Nocturia    HPI: 46 yo WM with testosterone deficiency, erectile dysfunction and BPH with lower urinary tract symptoms the worsening frequency presents today for three-week follow-up after a trial of Myrbetriq 50 mg daily.    BPH WITH LUTS His IPSS score today is 23, which is severe lower urinary tract symptomatology.  He is terrible with his quality life due to his urinary symptoms.  His PVR is 12 mL.  His previous IPSS score was 25/6.  His previous PVR was 42 mL.  His major complaints today are frequency, nocturia and straining to urinate.  He noted a very slight improvement with Myrbetriq, but he is still having nocturia x3-4.  He has been sleeping with his CPAP machine for three months.    He has been working with his PCP and his BS are under better control.  He denies gross hematuria, dysuria and suprapubic pain.  He also denies any recent fevers, chills, nausea or vomiting.  He does not have a family history of PCa.      IPSS    Row Name 04/21/16 0900 05/21/16 0900 06/12/16 1500     International Prostate Symptom Score   How often have you had the sensation of not emptying your bladder? Less than half the time Less than half the time Less than half the time   How often have you had to urinate less than every two hours? Almost always Almost always Almost always   How often have you found you stopped and started again several times when you urinated? Less than 1 in 5 times About half the time Less than half the time   How often have you found it difficult to postpone urination? About half the time More than half the time About half the time   How often have you had a weak urinary stream? Less than half the time More than half the time  About half the time   How often have you had to strain to start urination? Less than half the time About half the time More than half the time   How many times did you typically get up at night to urinate? 2 Times 4 Times 4 Times   Total IPSS Score 17 25 23      Quality of Life due to urinary symptoms   If you were to spend the rest of your life with your urinary condition just the way it is now how would you feel about that? Unhappy Terrible Terrible      Score:  1-7 Mild 8-19 Moderate 20-35 Severe  PMH: Past Medical History:  Diagnosis Date  . BPH (benign prostatic hyperplasia)   . Diabetes ([place])   . Dysuria   . GERD (gastroesophageal reflux disease)   . HTN (hypertension)   . Impotence   . Nocturia   . Obesity   . Sleep apnea   . Testicular hypofunction     Surgical History: Past Surgical History:  Procedure Laterality Date  . CHOLECYSTECTOMY    . VASECTOMY      Home Medications:  Allergies as of 06/12/2016   No Known Allergies     Medication List       Accurate as of  06/12/16  3:58 PM. Always use your most recent med list.          amLODipine 10 MG tablet Commonly known as:  NORVASC Take 10 mg by mouth daily.   atorvastatin 20 MG tablet Commonly known as:  LIPITOR Take 20 mg by mouth daily.   clomiPHENE 50 MG tablet Commonly known as:  CLOMID Take 1/2 tablet daily   Desmopressin Acetate 0.83 MCG/0.1ML Emul Commonly known as:  NOCTIVA Place 1 spray into the nose at bedtime.   JARDIANCE 25 MG Tabs tablet Generic drug:  empagliflozin Take 25 mg by mouth daily.   omeprazole 20 MG capsule Commonly known as:  PRILOSEC Take 20 mg by mouth daily.   sitaGLIPtin-metformin 50-1000 MG tablet Commonly known as:  JANUMET Take 1 tablet by mouth daily. Reported on 03/14/2015   tadalafil 20 MG tablet Commonly known as:  CIALIS Take 1 tablet (20 mg total) by mouth daily as needed for erectile dysfunction.       Allergies: No Known Allergies  Family  History: Family History  Problem Relation Age of Onset  . Hyperlipidemia Mother   . Diabetes Mellitus II Mother   . Seizures Mother   . Diabetes Father   . Cirrhosis Father        non alcoholic  . Kidney disease Neg Hx   . Prostate cancer Neg Hx   . Kidney cancer Neg Hx   . Bladder Cancer Neg Hx     Social History:  reports that he has been smoking Cigarettes.  He has been smoking about 1.00 pack per day. He has never used smokeless tobacco. He reports that he does not drink alcohol or use drugs.  ROS: UROLOGY Frequent Urination?: Yes Hard to postpone urination?: No Burning/pain with urination?: No Get up at night to urinate?: Yes Leakage of urine?: No Urine stream starts and stops?: No Trouble starting stream?: No Do you have to strain to urinate?: Yes Blood in urine?: No Urinary tract infection?: No Sexually transmitted disease?: No Injury to kidneys or bladder?: No Painful intercourse?: No Weak stream?: No Erection problems?: Yes Penile pain?: No  Gastrointestinal Nausea?: No Vomiting?: No Indigestion/heartburn?: Yes Diarrhea?: No Constipation?: No  Constitutional Fever: No Night sweats?: No Weight loss?: No Fatigue?: Yes  Skin Skin rash/lesions?: No Itching?: No  Eyes Blurred vision?: No Double vision?: No  Ears/Nose/Throat Sore throat?: No Sinus problems?: No  Hematologic/Lymphatic Swollen glands?: No Easy bruising?: No  Cardiovascular Leg swelling?: No Chest pain?: No  Respiratory Cough?: No Shortness of breath?: No  Endocrine Excessive thirst?: No  Musculoskeletal Back pain?: No Joint pain?: No  Neurological Headaches?: No Dizziness?: No  Psychologic Depression?: No Anxiety?: No  Physical Exam: BP 132/85   Pulse 85   Ht 5\' 11"  (1.803 m)   Wt 235 lb (106.6 kg)   BMI 32.78 kg/m   Constitutional: Well nourished. Alert and oriented, No acute distress. HEENT: [place] AT, moist mucus membranes. Trachea midline, no  masses. Cardiovascular: No clubbing, cyanosis, or edema. Respiratory: Normal respiratory effort, no increased work of breathing. Skin: No rashes, bruises or suspicious lesions. Lymph: No cervical or inguinal adenopathy. Neurologic: Grossly intact, no focal deficits, moving all 4 extremities. Psychiatric: Normal mood and affect.  Laboratory Data: PSA History  0.3 ng/mL on 04/00/2017  0.3 ng/mL on 04/21/2016  Lab Results  Component Value Date   WBC 9.6 11/02/2015   HGB 16.4 11/02/2015   HCT 48.8 04/21/2016   MCV 88.7 11/02/2015   PLT 171  11/02/2015    Lab Results  Component Value Date   CREATININE 0.87 11/02/2015    Lab Results  Component Value Date   AST 18 04/21/2016   Lab Results  Component Value Date   ALT 26 04/21/2016     Pertinent Imaging Results for TREYLON, HENARD (MRN [id number]) as of 06/12/2016 16:07  Ref. Range 06/12/2016 15:41  Scan Result Unknown 12     Assessment & Plan:    1. BPH with LUTS  - IPSS score is 23/6, it is worsening -  - Continue conservative management, avoiding bladder irritants and timed voiding's  - his most bothersome symptoms are frequency, urgency and nocturia  - Continue sleeping with CPAP  - Patient is working with Ms. Rolena Infante to DM under tighter control  - RTC in one month for I PSS and PVR   2. Testosterone deficiency  (Not addressed at this visit)  3. Erectile dysfunction:     (Not addressed at this visit)  4. Nocturia  - he is sleeping with his CPAP machine  - start Noctiva 0.83 mcg qhs  - check sodium level in one week  Return in about 1 week (around 06/19/2016) for sodium level only.  These notes generated with voice recognition software. I apologize for typographical errors.  Zara Council, [facility] [address] [place], [place] [address] [phone]

## 2016-06-12 ENCOUNTER — Ambulatory Visit (INDEPENDENT_AMBULATORY_CARE_PROVIDER_SITE_OTHER): Payer: BLUE CROSS/BLUE SHIELD | Admitting: Urology

## 2016-06-12 ENCOUNTER — Encounter: Payer: Self-pay | Admitting: Urology

## 2016-06-12 VITALS — BP 132/85 | HR 85 | Ht 71.0 in | Wt 235.0 lb

## 2016-06-12 DIAGNOSIS — N138 Other obstructive and reflux uropathy: Secondary | ICD-10-CM | POA: Diagnosis not present

## 2016-06-12 DIAGNOSIS — N401 Enlarged prostate with lower urinary tract symptoms: Secondary | ICD-10-CM

## 2016-06-12 DIAGNOSIS — R351 Nocturia: Secondary | ICD-10-CM | POA: Diagnosis not present

## 2016-06-12 DIAGNOSIS — N400 Enlarged prostate without lower urinary tract symptoms: Secondary | ICD-10-CM | POA: Diagnosis not present

## 2016-06-12 LAB — BLADDER SCAN AMB NON-IMAGING: Scan Result: 12

## 2016-06-12 MED ORDER — DESMOPRESSIN ACETATE 0.83 MCG/0.1ML NA EMUL: 1.0000 | Freq: Every day | NASAL | 11 refills | Status: DC

## 2016-06-19 ENCOUNTER — Other Ambulatory Visit: Payer: BLUE CROSS/BLUE SHIELD

## 2016-08-14 ENCOUNTER — Other Ambulatory Visit: Payer: Self-pay | Admitting: Urology

## 2016-08-14 ENCOUNTER — Telehealth: Payer: Self-pay | Admitting: Urology

## 2016-08-14 DIAGNOSIS — E291 Testicular hypofunction: Secondary | ICD-10-CM

## 2016-08-14 NOTE — Telephone Encounter (Signed)
Patient is requesting a refill on his Clomid, but I had started him on Noctiva at his last visit.  He was to have a sodium level drawn an week later.  He did not come in for his sodium blood draw.  I need that completed before I will refill the Clomid.

## 2016-08-14 NOTE — Telephone Encounter (Signed)
Called pt. No answer °

## 2016-08-18 ENCOUNTER — Encounter: Payer: Self-pay | Admitting: Family

## 2016-08-18 ENCOUNTER — Ambulatory Visit (INDEPENDENT_AMBULATORY_CARE_PROVIDER_SITE_OTHER): Payer: BLUE CROSS/BLUE SHIELD | Admitting: Family

## 2016-08-18 ENCOUNTER — Encounter (INDEPENDENT_AMBULATORY_CARE_PROVIDER_SITE_OTHER): Payer: Self-pay

## 2016-08-18 DIAGNOSIS — G4733 Obstructive sleep apnea (adult) (pediatric): Secondary | ICD-10-CM

## 2016-08-18 DIAGNOSIS — R351 Nocturia: Secondary | ICD-10-CM | POA: Diagnosis not present

## 2016-08-18 DIAGNOSIS — E118 Type 2 diabetes mellitus with unspecified complications: Secondary | ICD-10-CM | POA: Diagnosis not present

## 2016-08-18 DIAGNOSIS — E119 Type 2 diabetes mellitus without complications: Secondary | ICD-10-CM | POA: Insufficient documentation

## 2016-08-18 DIAGNOSIS — I10 Essential (primary) hypertension: Secondary | ICD-10-CM | POA: Diagnosis not present

## 2016-08-18 NOTE — Progress Notes (Signed)
Pre visit review using our clinic review tool, if applicable. No additional management support is needed unless otherwise documented below in the visit note. 

## 2016-08-18 NOTE — Progress Notes (Signed)
Subjective:    Patient ID: Ronald Pruitt., male    DOB: [birth year], 46 y.o.   MRN: [id number]  CC: Ronald Pruitt. is a 46 y.o. male who presents today to establish care.    HPI: Prior care had been with  [facility].   HTN- compliant with medication. Denies exertional chest pain or pressure, numbness or tingling radiating to left arm or jaw, palpitations, dizziness, frequent headaches, changes in vision, or shortness of breath.   DM- compliant with medications.  Checking a1c tomorrow with [facility]- will send. Fasting blood sugar 118.   BPH/ nocturia- Follows with urology Dr Ronald Pruitt. Notes some improvement with nocturia with better blood sugar control.   OSA- cipap machine.  Tinnitus- follows with ENT.        HISTORY:  Past Medical History:  Diagnosis Date  . Arthritis   . BPH (benign prostatic hyperplasia)   . Diabetes ([place])   . Dysuria   . GERD (gastroesophageal reflux disease)   . HTN (hypertension)   . Hyperlipidemia   . Impotence   . Nocturia   . Obesity   . Sleep apnea   . Testicular hypofunction    Past Surgical History:  Procedure Laterality Date  . CHOLECYSTECTOMY    . VASECTOMY     Family History  Problem Relation Age of Onset  . Hyperlipidemia Mother   . Diabetes Mellitus II Mother   . Seizures Mother   . Diabetes Mother   . Mental illness Mother   . Arthritis Mother   . Diabetes Father   . Cirrhosis Father        non alcoholic  . Hyperlipidemia Father   . Arthritis Father   . Liver cancer Father   . Diabetes Maternal Grandmother   . Hyperlipidemia Maternal Grandmother   . Arthritis Maternal Grandmother   . Diabetes Maternal Grandfather   . Hyperlipidemia Maternal Grandfather   . Arthritis Maternal Grandfather   . Diabetes Paternal Grandmother   . Hyperlipidemia Paternal Grandmother   . Arthritis Paternal Grandmother   . Diabetes Paternal Grandfather   . Hyperlipidemia Paternal Grandfather   . Arthritis Paternal  Grandfather   . Kidney disease Neg Hx   . Prostate cancer Neg Hx   . Kidney cancer Neg Hx   . Bladder Cancer Neg Hx   . Colon cancer Neg Hx     Allergies: Patient has no known allergies. Current Outpatient Prescriptions on File Prior to Visit  Medication Sig Dispense Refill  . amLODipine (NORVASC) 10 MG tablet Take 10 mg by mouth daily.    Marland Kitchen atorvastatin (LIPITOR) 20 MG tablet Take 20 mg by mouth daily.    . empagliflozin (JARDIANCE) 25 MG TABS tablet Take 25 mg by mouth daily.    Marland Kitchen omeprazole (PRILOSEC) 20 MG capsule Take 40 mg by mouth daily.     . sitaGLIPtin-metformin (JANUMET) 50-1000 MG per tablet Take 1 tablet by mouth daily. Reported on 03/14/2015    . tadalafil (CIALIS) 20 MG tablet Take 1 tablet (20 mg total) by mouth daily as needed for erectile dysfunction. (Patient taking differently: Take 25 mg by mouth daily as needed for erectile dysfunction. ) 6 tablet 0   No current facility-administered medications on file prior to visit.     Social History  Substance Use Topics  . Smoking status: Current Every Day Smoker    Packs/day: 1.00    Types: Cigarettes  . Smokeless tobacco: Never Used  . Alcohol use  No    Review of Systems  Constitutional: Negative for chills and fever.  Respiratory: Negative for cough.   Cardiovascular: Negative for chest pain and palpitations.  Gastrointestinal: Negative for nausea and vomiting.  Genitourinary: Positive for urgency. Negative for dysuria.      Objective:    BP (!) 138/94   Pulse 87   Temp 98.1 F (36.7 C) (Oral)   Ht 5\' 11"  (1.803 m)   Wt 243 lb 12.8 oz (110.6 kg)   SpO2 98%   BMI 34.00 kg/m  BP Readings from Last 3 Encounters:  08/18/16 (!) 138/94  06/12/16 132/85  05/21/16 126/79   Wt Readings from Last 3 Encounters:  08/18/16 243 lb 12.8 oz (110.6 kg)  06/12/16 235 lb (106.6 kg)  05/21/16 239 lb 1.6 oz (108.5 kg)    Physical Exam  Constitutional: He appears well-developed and well-nourished.  Cardiovascular:  Regular rhythm and normal heart sounds.   Pulmonary/Chest: Effort normal and breath sounds normal. No respiratory distress. He has no wheezes. He has no rhonchi. He has no rales.  Neurological: He is alert.  Skin: Skin is warm and dry.  Psychiatric: He has a normal mood and affect. His speech is normal and behavior is normal.  Vitals reviewed.      Assessment & Plan:   Problem List Items Addressed This Visit      Cardiovascular and Mediastinum   HTN (hypertension)    Slightly elevated today.Will continue to  Monitor.         Respiratory   OSA (obstructive sleep apnea)    Compliant with cipap.         Endocrine   DM (diabetes mellitus) ([place])    Appears controlled. Pending A1c with [facility].      Relevant Medications   pioglitazone (ACTOS) 30 MG tablet     Other   Nocturia    Chronic. Following with urology. Some improvement with improvement in diabetes control.           I have discontinued Ronald Pruitt. I am also having him maintain his amLODipine, atorvastatin, omeprazole, sitaGLIPtin-metformin, empagliflozin, tadalafil, cetirizine, pioglitazone, fluticasone, and clomiPHENE.   Meds ordered this encounter  Medications  . cetirizine (ZYRTEC) 10 MG tablet    Sig: Take 10 mg by mouth daily.  . pioglitazone (ACTOS) 30 MG tablet    Sig: Take 30 mg by mouth daily.  . fluticasone (FLONASE) 50 MCG/ACT nasal spray    Sig: Place into both nostrils daily.  . clomiPHENE (CLOMID) 50 MG tablet    Sig: Take by mouth daily.    Return precautions given.   Risks, benefits, and alternatives of the medications and treatment plan prescribed today were discussed, and patient expressed understanding.   Education regarding symptom management and diagnosis given to patient on AVS.  Continue to follow with Ronald Hawthorne, FNP for routine health maintenance.   Ronald Pruitt. and I agreed with plan.   Ronald Paris, FNP

## 2016-08-18 NOTE — Assessment & Plan Note (Signed)
Appears controlled. Pending A1c with [facility].

## 2016-08-18 NOTE — Assessment & Plan Note (Signed)
Slightly elevated today.Will continue to  Monitor.

## 2016-08-18 NOTE — Assessment & Plan Note (Signed)
Chronic. Following with urology. Some improvement with improvement in diabetes control.

## 2016-08-18 NOTE — Patient Instructions (Addendum)
Pleasure meeting you  Ask Tammie if you need tetanus vaccine- due every 10 years.   Follow up 3 months.

## 2016-08-18 NOTE — Assessment & Plan Note (Signed)
Compliant with cipap.  

## 2016-08-19 NOTE — Telephone Encounter (Signed)
LMOM for patient to return call about med refill.

## 2016-08-22 NOTE — Progress Notes (Signed)
Patient has been informed about taking BP and to make a Follow up.

## 2016-08-25 ENCOUNTER — Telehealth: Payer: Self-pay

## 2016-08-25 DIAGNOSIS — E291 Testicular hypofunction: Secondary | ICD-10-CM

## 2016-08-25 MED ORDER — CLOMIPHENE CITRATE 50 MG PO TABS: 50.0000 mg | ORAL_TABLET | Freq: Every day | ORAL | 0 refills | Status: DC

## 2016-08-25 NOTE — Telephone Encounter (Signed)
Pt called stating he was advised via [facility] to contact [facility] for further refills of clomid. Made pt aware will need labs. Pt has a lab appt for 9/19. Therefore only 30 days and no refills were given. Reinforced with pt to keep lab appt. Pt voiced understanding.

## 2016-08-26 ENCOUNTER — Telehealth: Payer: Self-pay | Admitting: Family

## 2016-08-26 DIAGNOSIS — E785 Hyperlipidemia, unspecified: Secondary | ICD-10-CM | POA: Insufficient documentation

## 2016-08-26 MED ORDER — ATORVASTATIN CALCIUM 40 MG PO TABS: 40.0000 mg | ORAL_TABLET | Freq: Every day | ORAL | 1 refills | Status: DC

## 2016-08-26 NOTE — Telephone Encounter (Signed)
Call pt  I received labs from [facility]  Your cholesterol and triglycerides have increased since last check. Your cardiovascular risk over the next 10 years is 24  % ( > 7.5% is cut off for concern).  Please follow low trans fat and low saturated fat diet. Maintain exercise 30 minutes per day , goal of 4- 5 times per week .   I have increased your lipitor to be high intensity based on your risk factors- please let meknow if any side  Effects  Normal kidney function and electrolytes. Your PSA or prostate was also normal. Your thyroid was normal.   Your hemoglobin A1c was high at 8.7.Please make an appointment to discuss  Your regimen as we need to make changes- please also bring fasting blood sugars in the morning and also sugars ( non fasting) at bedtime.   Vitamin D is slightly low. Please start cholecalciferol 800 units daily for next 3 to 4 months. You may find this over the counter in the drug store. Please call to make an appointment for 3 to 4 months out so we can recheck. Also, please ensure you are following a diet high in calcium -- research shows better outcomes with dietary sources including kale, yogurt, broccolii, cheese, okra, almonds- to name a few.

## 2016-08-26 NOTE — Telephone Encounter (Signed)
Left message for patient to return call back.  

## 2016-08-26 NOTE — Telephone Encounter (Signed)
Patient was informed of results.  Patient understood and no questions, comments, or concerns at this time.  

## 2016-09-15 ENCOUNTER — Ambulatory Visit (INDEPENDENT_AMBULATORY_CARE_PROVIDER_SITE_OTHER): Payer: 59 | Admitting: Family

## 2016-09-15 ENCOUNTER — Encounter: Payer: Self-pay | Admitting: Family

## 2016-09-15 VITALS — BP 126/84 | HR 97 | Temp 98.0°F | Resp 14 | Wt 238.0 lb

## 2016-09-15 DIAGNOSIS — M545 Low back pain, unspecified: Secondary | ICD-10-CM

## 2016-09-15 MED ORDER — LIDOCAINE 5 % EX PTCH: 1.0000 | MEDICATED_PATCH | CUTANEOUS | 0 refills | Status: DC

## 2016-09-15 MED ORDER — PREDNISONE 10 MG PO TABS
ORAL_TABLET | ORAL | 0 refills | Status: DC
Start: 2016-09-15 — End: 2016-11-04

## 2016-09-15 MED ORDER — MELOXICAM 7.5 MG PO TABS
7.5000 mg | ORAL_TABLET | Freq: Every day | ORAL | 0 refills | Status: DC
Start: 2016-09-15 — End: 2016-10-12

## 2016-09-15 NOTE — Patient Instructions (Signed)
may start prednisone taper tomorrow. Do not start today as will  interfere with sleep. Also note, on prednisone and blood sugars will be elevated  Lidocaine patches for great for work  Please take Mobic every day. Please stop taking all other over-the-counter anti-inflammatories including Goody powder, ibuprofen, Advil, aspirin  If there is no improvement in your symptoms, or if there is any worsening of symptoms, or if you have any additional concerns, please return for re-evaluation; or, if we are closed, consider going to the Emergency Room for evaluation if symptoms urgent.

## 2016-09-15 NOTE — Progress Notes (Signed)
Subjective:    Patient ID: Ronald Pruitt., male    DOB: [birth year], 46 y.o.   MRN: [id number]  CC: Ronald Pruitt. is a 46 y.o. male who presents today for an acute visit.    HPI: CC: low back pain x 2 months, worsened over past 2-3 weeks.  In past, back pain has been intermittent.  Radiates to groin. Pain with sitting and especially when laying flat. NO injury.   No  Fever, dysuria, hematuria, abdominal pain, constipation, N, V.   Tried heat, OTC NSAIDs without relief- in the past these thing would work.   No h/o back surgery, cancer.     HISTORY:  Past Medical History:  Diagnosis Date  . Arthritis   . BPH (benign prostatic hyperplasia)   . Diabetes ([place])   . Dysuria   . GERD (gastroesophageal reflux disease)   . HTN (hypertension)   . Hyperlipidemia   . Impotence   . Nocturia   . Obesity   . Sleep apnea   . Testicular hypofunction    Past Surgical History:  Procedure Laterality Date  . CHOLECYSTECTOMY    . VASECTOMY     Family History  Problem Relation Age of Onset  . Hyperlipidemia Mother   . Diabetes Mellitus II Mother   . Seizures Mother   . Diabetes Mother   . Mental illness Mother   . Arthritis Mother   . Diabetes Father   . Cirrhosis Father        non alcoholic  . Hyperlipidemia Father   . Arthritis Father   . Liver cancer Father   . Diabetes Maternal Grandmother   . Hyperlipidemia Maternal Grandmother   . Arthritis Maternal Grandmother   . Diabetes Maternal Grandfather   . Hyperlipidemia Maternal Grandfather   . Arthritis Maternal Grandfather   . Diabetes Paternal Grandmother   . Hyperlipidemia Paternal Grandmother   . Arthritis Paternal Grandmother   . Diabetes Paternal Grandfather   . Hyperlipidemia Paternal Grandfather   . Arthritis Paternal Grandfather   . Kidney disease Neg Hx   . Prostate cancer Neg Hx   . Kidney cancer Neg Hx   . Bladder Cancer Neg Hx   . Colon cancer Neg Hx     Allergies: Patient has no known  allergies. Current Outpatient Prescriptions on File Prior to Visit  Medication Sig Dispense Refill  . amLODipine (NORVASC) 10 MG tablet Take 10 mg by mouth daily.    Marland Kitchen atorvastatin (LIPITOR) 40 MG tablet Take 1 tablet (40 mg total) by mouth daily. 90 tablet 1  . cetirizine (ZYRTEC) 10 MG tablet Take 10 mg by mouth daily.    . clomiPHENE (CLOMID) 50 MG tablet Take 1 tablet (50 mg total) by mouth daily. 30 tablet 0  . empagliflozin (JARDIANCE) 25 MG TABS tablet Take 25 mg by mouth daily.    . fluticasone (FLONASE) 50 MCG/ACT nasal spray Place into both nostrils daily.    Marland Kitchen omeprazole (PRILOSEC) 20 MG capsule Take 40 mg by mouth daily.     . pioglitazone (ACTOS) 30 MG tablet Take 30 mg by mouth daily.    . sitaGLIPtin-metformin (JANUMET) 50-1000 MG per tablet Take 1 tablet by mouth daily. Reported on 03/14/2015    . tadalafil (CIALIS) 20 MG tablet Take 1 tablet (20 mg total) by mouth daily as needed for erectile dysfunction. (Patient taking differently: Take 25 mg by mouth daily as needed for erectile dysfunction. ) 6 tablet 0   No  current facility-administered medications on file prior to visit.     Social History  Substance Use Topics  . Smoking status: Current Every Day Smoker    Packs/day: 1.00    Types: Cigarettes  . Smokeless tobacco: Never Used  . Alcohol use No    Review of Systems  Constitutional: Negative for chills and fever.  Respiratory: Negative for cough.   Cardiovascular: Negative for chest pain and palpitations.  Gastrointestinal: Negative for abdominal pain, nausea and vomiting.  Genitourinary: Negative for dysuria and hematuria.  Musculoskeletal: Positive for back pain.      Objective:    BP 126/84 (BP Location: Left Arm, Patient Position: Sitting, Cuff Size: Large)   Pulse 97   Temp 98 F (36.7 C) (Oral)   Resp 14   Wt 238 lb (108 kg)   SpO2 94%   BMI 33.19 kg/m    Physical Exam  Constitutional: He appears well-developed and well-nourished.    Cardiovascular: Regular rhythm and normal heart sounds.   Pulmonary/Chest: Effort normal and breath sounds normal. No respiratory distress. He has no wheezes. He has no rales.  Musculoskeletal:       Lumbar back: He exhibits pain. He exhibits normal range of motion, no tenderness, no swelling and no spasm.       Back:  Pain as noted on diagram. Full range of motion with flexion, extension, lateral side bends. No pain, numbness, tingling elicited with single leg raise bilaterally. No rash.  Right Hip: No limp or waddling gait. Full ROM with flexion and hip rotation in flexion.  No pain of lateral hip with  (flexion-abduction-external rotation) test. No pain with deep palpation of greater trochanter.    Neurological: He is alert.  Skin: Skin is warm and dry.  Psychiatric: He has a normal mood and affect. His speech is normal and behavior is normal.  Vitals reviewed.      Assessment & Plan:   1. Acute bilateral low back pain without sciatica Acute. Normal hip exam. Suspect low back pain radiating to hips. Will trial prednisone, mobic, lidocaine. If no relief, we have discussed PT, imaging and consult with orthopedics.    - meloxicam (MOBIC) 7.5 MG tablet; Take 1 tablet (7.5 mg total) by mouth daily.  Dispense: 30 tablet; Refill: 0 - lidocaine (LIDODERM) 5 %; Place 1 patch onto the skin daily. Remove & Discard patch within 12 hours.  Dispense: 30 patch; Refill: 0 - predniSONE (DELTASONE) 10 MG tablet; Take 40 mg by mouth on day 1, then taper 10 mg daily until gone  Dispense: 10 tablet; Refill: 0    I am having Ronald Pruitt maintain his amLODipine, omeprazole, sitaGLIPtin-metformin, empagliflozin, tadalafil, cetirizine, pioglitazone, fluticasone, clomiPHENE, and atorvastatin.   No orders of the defined types were placed in this encounter.   Return precautions given.   Risks, benefits, and alternatives of the medications and treatment plan prescribed today were discussed, and patient  expressed understanding.   Education regarding symptom management and diagnosis given to patient on AVS.  Continue to follow with Burnard Hawthorne, FNP for routine health maintenance.   Ronald Pruitt. and I agreed with plan.   Mable Paris, FNP

## 2016-10-10 ENCOUNTER — Telehealth: Payer: Self-pay | Admitting: Family

## 2016-10-10 DIAGNOSIS — E0800 Diabetes mellitus due to underlying condition with hyperosmolarity without nonketotic hyperglycemic-hyperosmolar coma (NKHHC): Secondary | ICD-10-CM

## 2016-10-10 NOTE — Telephone Encounter (Signed)
Patient has an appointment 11/18/16 patient needs A1c to close [facility] quality metric. Lab ordered [facility]

## 2016-10-12 ENCOUNTER — Other Ambulatory Visit: Payer: Self-pay | Admitting: Family

## 2016-10-12 DIAGNOSIS — M545 Low back pain, unspecified: Secondary | ICD-10-CM

## 2016-10-13 NOTE — Telephone Encounter (Signed)
Noted thanks °

## 2016-10-13 NOTE — Telephone Encounter (Signed)
Last fill and last OV 09/15/16 ok to fill?

## 2016-10-15 ENCOUNTER — Other Ambulatory Visit: Payer: 59

## 2016-10-15 ENCOUNTER — Other Ambulatory Visit: Payer: Self-pay | Admitting: Urology

## 2016-10-15 DIAGNOSIS — E291 Testicular hypofunction: Secondary | ICD-10-CM

## 2016-10-15 DIAGNOSIS — R351 Nocturia: Secondary | ICD-10-CM

## 2016-10-16 ENCOUNTER — Telehealth: Payer: Self-pay | Admitting: *Deleted

## 2016-10-16 LAB — SODIUM: SODIUM: 140 mmol/L (ref 134–144)

## 2016-10-16 LAB — HEPATIC FUNCTION PANEL
ALT: 21 IU/L (ref 0–44)
AST: 28 IU/L (ref 0–40)
Albumin: 4.1 g/dL (ref 3.5–5.5)
Alkaline Phosphatase: 78 IU/L (ref 39–117)
Bilirubin Total: 0.3 mg/dL (ref 0.0–1.2)
Bilirubin, Direct: 0.11 mg/dL (ref 0.00–0.40)
Total Protein: 6.9 g/dL (ref 6.0–8.5)

## 2016-10-16 LAB — HEMOGLOBIN: HEMOGLOBIN: 15.1 g/dL (ref 13.0–17.7)

## 2016-10-16 LAB — TESTOSTERONE: TESTOSTERONE: 486 ng/dL (ref 264–916)

## 2016-10-16 LAB — HEMATOCRIT: Hematocrit: 46.4 % (ref 37.5–51.0)

## 2016-10-16 NOTE — Telephone Encounter (Signed)
-----   Message from Nori Riis, PA-C sent at 10/16/2016  8:03 AM EDT ----- Please add a PSA to his blood work.

## 2016-10-16 NOTE — Telephone Encounter (Signed)
Added blood test PSA to labs with [facility] in our [place] lab.

## 2016-10-21 NOTE — Progress Notes (Signed)
10/22/2016 9:02 AM   Ronald Pruitt. 10-31-1970 [id number]  Referring provider: Serita Butcher, [place], [place] [address]  Chief Complaint  Patient presents with  . Follow-up    HPI: Patient is a 46 year old Caucasian male with testosterone deficiency, ED and BPH with LU TS who presents today for 6 month follow-up.      Testosterone deficiency Patient is experiencing a decrease in libido, a lack of energy, a decrease in strength and erections being less strong.   This is indicated by his responses to the ADAM questionnaire.  He is no longer having spontaneous erections at night.  He does have sleep apnea and is sleeping with his CPAP machine.  He is reporting a loss of body hair, reduced beard growth, obesity and type 2 diabetes mellitus   His current testosterone level is 486 ng/dL on 10/15/2016.  He is currently managing his hypogonadism with Clomid 50 mg, 1/2 tablet daily.           Androgen Deficiency in the Aging Male    [place] Name 10/22/16 0800         Androgen Deficiency in the Aging Male   Do you have a decrease in libido (sex drive) Yes     Do you have lack of energy Yes     Do you have a decrease in strength and/or endurance Yes     Have you lost height No     Have you noticed a decreased "enjoyment of life" No     Are you sad and/or grumpy No     Are your erections less strong Yes     Have you noticed a recent deterioration in your ability to play sports No     Are you falling asleep after dinner No     Has there been a recent deterioration in your work performance No        Erectile dysfunction His SHIM score is 14, which is mild to moderate ED.   His previous SHIM score was 9.  He has been having difficulty with erections for several years.   His major complaint is lack of firmness.  His libido is dimished   His risk factors for ED are age, BPH, testosterone deficiency, DM, HTN, HLD, sleep apnea, stress and night shift work.  He  denies any painful erections or curvatures with his erections.   He is still having/no longer having spontaneous erections.  He is taking Cialis with good effect.       SHIM    Row Name 10/22/16 0839         SHIM: Over the last 6 months:   How do you rate your confidence that you could get and keep an erection? Very Low     When you had erections with sexual stimulation, how often were your erections hard enough for penetration (entering your partner)? A Few Times (much less than half the time)     During sexual intercourse, how often were you able to maintain your erection after you had penetrated (entered) your partner? Sometimes (about half the time)     During sexual intercourse, how difficult was it to maintain your erection to completion of intercourse? Slightly Difficult     When you attempted sexual intercourse, how often was it satisfactory for you? Most Times (much more than half the time)       SHIM Total Score   SHIM 14  Score: 1-7 Severe ED 8-11 Moderate ED 12-16 Mild-Moderate ED 17-21 Mild ED 22-25 No ED    BPH WITH LUTS His IPSS score today is 15, which is moderate lower urinary tract symptomatology. He is unhappy with his quality life due to his urinary symptoms.   His previous IPSS score was 25/6.  His previous PVR was 42 mL.  His major complaints today are frequency, nocturia and straining to urinate.   He has had these symptoms for few years.  He has been sleeping with his CPAP machine for three months.  His BS have been running 150-250.  His last HbgA1c was 10 % in 10/2015.  He denies any dysuria, hematuria or suprapubic pain.  He also denies any recent fevers, chills, nausea or vomiting.  He does not have a family history of PCa.      IPSS    Row Name 10/22/16 0800         International Prostate Symptom Score   How often have you had the sensation of not emptying your bladder? Less than half the time     How often have you had to urinate less than  every two hours? More than half the time     How often have you found you stopped and started again several times when you urinated? Not at All     How often have you found it difficult to postpone urination? Less than 1 in 5 times     How often have you had a weak urinary stream? Less than half the time     How often have you had to strain to start urination? About half the time     How many times did you typically get up at night to urinate? 3 Times     Total IPSS Score 15       Quality of Life due to urinary symptoms   If you were to spend the rest of your life with your urinary condition just the way it is now how would you feel about that? Unhappy        Score:  1-7 Mild 8-19 Moderate 20-35 Severe  PMH: Past Medical History:  Diagnosis Date  . Arthritis   . BPH (benign prostatic hyperplasia)   . Diabetes ([place])   . Dysuria   . GERD (gastroesophageal reflux disease)   . HTN (hypertension)   . Hyperlipidemia   . Impotence   . Nocturia   . Obesity   . Sleep apnea   . Testicular hypofunction     Surgical History: Past Surgical History:  Procedure Laterality Date  . CHOLECYSTECTOMY    . VASECTOMY      Home Medications:  Allergies as of 10/22/2016   No Known Allergies     Medication List       Accurate as of 10/22/16  9:02 AM. Always use your most recent med list.          amLODipine 10 MG tablet Commonly known as:  NORVASC Take 10 mg by mouth daily.   atorvastatin 40 MG tablet Commonly known as:  LIPITOR Take 1 tablet (40 mg total) by mouth daily.   cetirizine 10 MG tablet Commonly known as:  ZYRTEC Take 10 mg by mouth daily.   clomiPHENE 50 MG tablet Commonly known as:  CLOMID TAKE 1 TABLET BY MOUTH DAILY   fluticasone 50 MCG/ACT nasal spray Commonly known as:  FLONASE Place into both nostrils daily.   JARDIANCE 25 MG Tabs tablet  Generic drug:  empagliflozin Take 25 mg by mouth daily.   lidocaine 5 % Commonly known as:  LIDODERM Place 1  patch onto the skin daily. Remove & Discard patch within 12 hours.   meloxicam 7.5 MG tablet Commonly known as:  MOBIC TAKE 1 TABLET BY MOUTH EVERY DAY   omeprazole 20 MG capsule Commonly known as:  PRILOSEC Take 40 mg by mouth daily.   pioglitazone 30 MG tablet Commonly known as:  ACTOS Take 30 mg by mouth daily.   predniSONE 10 MG tablet Commonly known as:  DELTASONE Take 40 mg by mouth on day 1, then taper 10 mg daily until gone   sitaGLIPtin-metformin 50-1000 MG tablet Commonly known as:  JANUMET Take 1 tablet by mouth daily. Reported on 03/14/2015   tadalafil 20 MG tablet Commonly known as:  CIALIS Take 1 tablet (20 mg total) by mouth daily as needed for erectile dysfunction.            Discharge Care Instructions        Start     Ordered   10/22/16 0000  tadalafil (CIALIS) 20 MG tablet  Daily PRN    Question:  Supervising Provider  Answer:  Hollice Espy   10/22/16 2353      Allergies: No Known Allergies  Family History: Family History  Problem Relation Age of Onset  . Hyperlipidemia Mother   . Diabetes Mellitus II Mother   . Seizures Mother   . Diabetes Mother   . Mental illness Mother   . Arthritis Mother   . Diabetes Father   . Cirrhosis Father        non alcoholic  . Hyperlipidemia Father   . Arthritis Father   . Liver cancer Father   . Diabetes Maternal Grandmother   . Hyperlipidemia Maternal Grandmother   . Arthritis Maternal Grandmother   . Diabetes Maternal Grandfather   . Hyperlipidemia Maternal Grandfather   . Arthritis Maternal Grandfather   . Diabetes Paternal Grandmother   . Hyperlipidemia Paternal Grandmother   . Arthritis Paternal Grandmother   . Diabetes Paternal Grandfather   . Hyperlipidemia Paternal Grandfather   . Arthritis Paternal Grandfather   . Kidney disease Neg Hx   . Prostate cancer Neg Hx   . Kidney cancer Neg Hx   . Bladder Cancer Neg Hx   . Colon cancer Neg Hx     Social History:  reports that he has  been smoking Cigarettes.  He has been smoking about 1.00 pack per day. He has never used smokeless tobacco. He reports that he does not drink alcohol or use drugs.  ROS: UROLOGY Frequent Urination?: Yes Hard to postpone urination?: No Burning/pain with urination?: No Get up at night to urinate?: Yes Leakage of urine?: No Urine stream starts and stops?: No Trouble starting stream?: No Do you have to strain to urinate?: Yes Blood in urine?: No Urinary tract infection?: No Sexually transmitted disease?: No Injury to kidneys or bladder?: No Painful intercourse?: No Weak stream?: No Erection problems?: Yes Penile pain?: No  Gastrointestinal Nausea?: No Vomiting?: No Indigestion/heartburn?: Yes Diarrhea?: No Constipation?: No  Constitutional Fever: No Night sweats?: No Weight loss?: No Fatigue?: Yes  Skin Skin rash/lesions?: No Itching?: No  Eyes Blurred vision?: No Double vision?: No  Ears/Nose/Throat Sore throat?: No Sinus problems?: No  Hematologic/Lymphatic Swollen glands?: No Easy bruising?: No  Cardiovascular Leg swelling?: No Chest pain?: No  Respiratory Cough?: No Shortness of breath?: No  Endocrine Excessive thirst?: No  Musculoskeletal Back pain?: No Joint pain?: No  Neurological Headaches?: No Dizziness?: No  Psychologic Depression?: No Anxiety?: No  Physical Exam: BP 120/82   Pulse 88   Ht 6' (1.829 m)   Wt 244 lb 11.2 oz (111 kg)   BMI 33.19 kg/m   Constitutional: Well nourished. Alert and oriented, No acute distress. HEENT: [place] AT, moist mucus membranes. Trachea midline, no masses. Cardiovascular: No clubbing, cyanosis, or edema. Respiratory: Normal respiratory effort, no increased work of breathing. GI: Abdomen is soft, non tender, non distended, no abdominal masses. Liver and spleen not palpable.  No hernias appreciated.  Stool sample for occult testing is not indicated.   GU: No CVA tenderness.  No bladder fullness or  masses.  Patient with circumcised phallus.  Urethral meatus is patent.  No penile discharge. No penile lesions or rashes. Scrotum without lesions, cysts, rashes and/or edema.  Testicles are located scrotally bilaterally. No masses are appreciated in the testicles. Left and right epididymis are normal. Rectal: Patient with  normal sphincter tone. Anus and perineum without scarring or rashes. No rectal masses are appreciated. Prostate is approximately 45 grams, no nodules are appreciated. Seminal vesicles are normal. Skin: No rashes, bruises or suspicious lesions. Lymph: No cervical or inguinal adenopathy. Neurologic: Grossly intact, no focal deficits, moving all 4 extremities. Psychiatric: Normal mood and affect.  Laboratory Data: PSA History  0.3 ng/mL on 04/00/2017  0.3 ng/mL on 04/21/2016  0.3 ng/mL on 10/15/2016 Lab Results  Component Value Date   WBC 9.6 11/02/2015   HGB 15.1 10/15/2016   HCT 46.4 10/15/2016   MCV 88.7 11/02/2015   PLT 171 11/02/2015    Lab Results  Component Value Date   CREATININE 0.87 11/02/2015    Lab Results  Component Value Date   AST 28 10/15/2016   Lab Results  Component Value Date   ALT 21 10/15/2016   I have reviewed the labs  Pertinent Imaging CLINICAL DATA:  46 year old male presenting with history of chest pain which began yesterday evening. Elevated blood pressure.  EXAM: CT ANGIOGRAPHY CHEST, ABDOMEN AND PELVIS  TECHNIQUE: Multidetector CT imaging through the chest, abdomen and pelvis was performed using the standard protocol during bolus administration of intravenous contrast. Multiplanar reconstructed images and MIPs were obtained and reviewed to evaluate the vascular anatomy.  CONTRAST:  100 mL of Isovue 370.  COMPARISON:  None.  FINDINGS: CTA CHEST FINDINGS  Cardiovascular: Heart size is normal. There is no significant pericardial fluid, thickening or pericardial calcification. No crescentic high attenuation  associated with the wall of the thoracic aorta to suggest acute intramural hemorrhage. No significant atherosclerotic disease, aneurysm or dissection in the thoracic aorta or great vessels of the mediastinum.  Mediastinum/Nodes: No high attenuation fluid collection in the mediastinum to suggest mediastinal hematoma. No pathologically enlarged mediastinal or hilar lymph nodes. Esophagus is normal in appearance. No axillary lymphadenopathy.  Lungs/Pleura: Dependent areas of scarring and/or subsegmental atelectasis are noted in the lower lobes of the lungs bilaterally. There is no acute consolidative airspace disease. No pleural effusions. No suspicious appearing pulmonary nodules or masses are noted.  Musculoskeletal: There are no aggressive appearing lytic or blastic lesions noted in the visualized portions of the skeleton.  Review of the MIP images confirms the above findings.  CTA ABDOMEN AND PELVIS FINDINGS  VASCULAR  Aorta: Aortic atherosclerosis, without evidence of aneurysm or dissection.  Celiac: Patent without evidence of aneurysm, dissection, vasculitis or significant stenosis.  SMA: Patent without evidence of aneurysm, dissection, vasculitis  or significant stenosis.  Renals: Single renal arteries bilaterally, both of which are patent without hemodynamically significant stenosis.  IMA: Patent without hemodynamically significant stenosis.  Inflow: Patent without evidence of aneurysm, dissection, vasculitis or significant stenosis.  Veins: No obvious venous abnormality within the limitations of this arterial phase study.  Review of the MIP images confirms the above findings.  NON-VASCULAR  Hepatobiliary: Diffuse low attenuation throughout the hepatic parenchyma, compatible with hepatic steatosis. No cystic or solid hepatic lesions. No intra or extrahepatic biliary ductal dilatation. Status post cholecystectomy.  Pancreas: No pancreatic  mass. No pancreatic ductal dilatation. No pancreatic or peripancreatic fluid or inflammatory changes.  Spleen: Unremarkable.  Adrenals/Urinary Tract: 13 mm nonobstructive calculus in the upper pole collecting system of the left kidney. No calculi are noted along course of either ureter or within the lumen of the urinary bladder. No hydroureteronephrosis. Sub cm low-attenuation lesion in the upper pole the right kidney is too small to definitively characterize, but is favored to represent tiny cysts. No suspicious renal lesions. Urinary bladder is normal in appearance. Bilateral adrenal glands are normal in appearance.  Stomach/Bowel: The appearance of the stomach is normal. No pathologic dilatation of small bowel or colon. Normal appendix.  Lymphatic: No lymphadenopathy noted in the abdomen or pelvis.  Reproductive: Prostate gland and seminal vesicles are unremarkable in appearance.  Other: No significant volume of ascites.  No pneumoperitoneum.  Musculoskeletal: There are no aggressive appearing lytic or blastic lesions noted in the visualized portions of the skeleton.  Review of the MIP images confirms the above findings.  IMPRESSION: 1. No acute findings in the chest, abdomen or pelvis. Specifically, no evidence of aortic aneurysm, dissection or other acute aortic syndrome. 2. Aortic atherosclerosis (mild). 3. Severe hepatic steatosis. 4. 13 mm nonobstructive calculus in the upper pole collecting system of left kidney. No ureteral stones or findings of urinary tract obstruction are noted at this time. 5. Additional incidental findings, as above.   Electronically Signed   By: Vinnie Langton M.D.   On: 11/02/2015 15:42  I have independently reviewed the films.   Assessment & Plan:    1. Testosterone deficiency   -most recent testosterone level is 486 ng/dL on 10/15/2016  (goal 450-600 ng/dL)  -continue Clomid 50 mg, 1 tablet daily; refills  given  -RTC in 6 months for HCT/HBG, testosterone before 10 AM, LFT's, ADAM and exam  2. BPH with LUTS  - IPSS score is 15/5, it is improving  - Continue conservative management, avoiding bladder irritants and timed voiding's  - RTC in 6 months for IPSS, PSA, PVR and exam, as testosterone therapy can cause prostate enlargement and worsen LUTS  3. Erectile dysfunction:     -SHIM score is 14, it is improving  -continue Viagra 100 mg, 1 tablet 2 hours prior to intercourse on an empty stomach, Cialis 20 mg, one tablet 2 hours prior to intercourse on an empty stomach Levitra, Stendra, Trimix, sildenafil 20 mg, 3-5 tablets 2 hours prior to intercourse on an empty stomach: refills given  -RTC in 6 months for SHIM score and exam, as testosterone therapy can affect erections  4. Nocturia  - he is sleeping with his CPAP machine  - did not try Noctiva as it was cost prohibitive - has new insurance - will resubmit the script  Return in about 6 months (around 04/21/2017) for ADAM, IPSS, SHIM and exam, PSA, HBG, LFT's, HCT, testosterone (before 10 AM).  These notes generated with voice recognition software. I  apologize for typographical errors.  Zara Council, [facility] [address] [place], [place] [address] [phone]

## 2016-10-22 ENCOUNTER — Ambulatory Visit (INDEPENDENT_AMBULATORY_CARE_PROVIDER_SITE_OTHER): Payer: 59 | Admitting: Urology

## 2016-10-22 ENCOUNTER — Encounter: Payer: Self-pay | Admitting: Urology

## 2016-10-22 VITALS — BP 120/82 | HR 88 | Ht 72.0 in | Wt 244.7 lb

## 2016-10-22 DIAGNOSIS — N401 Enlarged prostate with lower urinary tract symptoms: Secondary | ICD-10-CM

## 2016-10-22 DIAGNOSIS — N138 Other obstructive and reflux uropathy: Secondary | ICD-10-CM

## 2016-10-22 DIAGNOSIS — N529 Male erectile dysfunction, unspecified: Secondary | ICD-10-CM

## 2016-10-22 DIAGNOSIS — E349 Endocrine disorder, unspecified: Secondary | ICD-10-CM | POA: Diagnosis not present

## 2016-10-22 MED ORDER — TADALAFIL 20 MG PO TABS: 20.0000 mg | ORAL_TABLET | Freq: Every day | ORAL | 11 refills | Status: DC | PRN

## 2016-10-22 MED ORDER — DESMOPRESSIN ACETATE 0.83 MCG/0.1ML NA EMUL: 1.0000 | Freq: Every day | NASAL | 11 refills | Status: DC

## 2016-10-23 LAB — SPECIMEN STATUS REPORT

## 2016-10-23 LAB — PSA: PROSTATE SPECIFIC AG, SERUM: 0.3 ng/mL (ref 0.0–4.0)

## 2016-10-27 ENCOUNTER — Telehealth: Payer: Self-pay | Admitting: Family

## 2016-10-27 DIAGNOSIS — K760 Fatty (change of) liver, not elsewhere classified: Secondary | ICD-10-CM | POA: Insufficient documentation

## 2016-10-27 NOTE — Telephone Encounter (Signed)
Call pt  In reviewing chart noticed he had severe fatty liver disease noted on CT abdomen 2017.  Please advise f/u appt with me. We need to monitor liver enzymes and also would advise US liver. Ordered ahead of appt

## 2016-10-27 NOTE — Telephone Encounter (Signed)
Left messgae for patient to return call back.

## 2016-10-28 NOTE — Telephone Encounter (Signed)
Pt called returning your call. Thank you! °

## 2016-10-28 NOTE — Telephone Encounter (Signed)
Patient has been made aware and stated he will comply.

## 2016-11-03 ENCOUNTER — Ambulatory Visit
Admission: RE | Admit: 2016-11-03 | Discharge: 2016-11-03 | Disposition: A | Discharge status: Home / Self Care | Payer: 59 | Source: Ambulatory Visit | Attending: Family | Admitting: Family

## 2016-11-03 DIAGNOSIS — K760 Fatty (change of) liver, not elsewhere classified: Secondary | ICD-10-CM | POA: Diagnosis present

## 2016-11-03 DIAGNOSIS — Z9049 Acquired absence of other specified parts of digestive tract: Secondary | ICD-10-CM | POA: Diagnosis not present

## 2016-11-04 ENCOUNTER — Ambulatory Visit (INDEPENDENT_AMBULATORY_CARE_PROVIDER_SITE_OTHER): Payer: 59 | Admitting: Family

## 2016-11-04 ENCOUNTER — Encounter: Payer: Self-pay | Admitting: Family

## 2016-11-04 VITALS — BP 148/98 | HR 69 | Temp 98.1°F | Ht 72.0 in | Wt 240.4 lb

## 2016-11-04 DIAGNOSIS — E118 Type 2 diabetes mellitus with unspecified complications: Secondary | ICD-10-CM

## 2016-11-04 DIAGNOSIS — K760 Fatty (change of) liver, not elsewhere classified: Secondary | ICD-10-CM | POA: Diagnosis not present

## 2016-11-04 DIAGNOSIS — J302 Other seasonal allergic rhinitis: Secondary | ICD-10-CM

## 2016-11-04 MED ORDER — SITAGLIPTIN PHOS-METFORMIN HCL 50-1000 MG PO TABS: 1.0000 | ORAL_TABLET | Freq: Every day | ORAL | 3 refills | Status: DC

## 2016-11-04 MED ORDER — CETIRIZINE HCL 10 MG PO TABS: 10.0000 mg | ORAL_TABLET | Freq: Every day | ORAL | 5 refills | Status: DC

## 2016-11-04 NOTE — Patient Instructions (Addendum)
Referral to GI  Follow up 3 months with colleague     Nonalcoholic Fatty Liver Disease Diet Introduction Nonalcoholic fatty liver disease is a condition that causes fat to accumulate in and around the liver. The disease makes it harder for the liver to work the way that it should. Following a healthy diet can help to keep nonalcoholic fatty liver disease under control. It can also help to prevent or improve conditions that are associated with the disease, such as heart disease, diabetes, high blood pressure, and abnormal cholesterol levels. Along with regular exercise, this diet:  Promotes weight loss.  Helps to control blood sugar levels.  Helps to improve the way that the body uses insulin. What do I need to know about this diet?  Use the glycemic index (GI) to plan your meals. The index tells you how quickly a food will raise your blood sugar. Choose low-GI foods. These foods take a longer time to raise blood sugar.  Keep track of how many calories you take in. Eating the right amount of calories will help you to achieve a healthy weight.  You may want to follow a Mediterranean diet. This diet includes a lot of vegetables, lean meats or fish, whole grains, fruits, and healthy oils and fats. What foods can I eat? Grains  Whole grains, such as whole-wheat or whole-grain breads, crackers, tortillas, cereals, and pasta. Stone-ground whole wheat. Pumpernickel bread. Unsweetened oatmeal. Bulgur. Barley. Quinoa. Brown or wild rice. Corn or whole-wheat flour tortillas. Vegetables  Lettuce. Spinach. Peas. Beets. Cauliflower. Cabbage. Broccoli. Carrots. Tomatoes. Squash. Eggplant. Herbs. Peppers. Onions. Cucumbers. Brussels sprouts. Yams and sweet potatoes. Beans. Lentils. Fruits  Bananas. Apples. Oranges. Grapes. Papaya. Mango. Pomegranate. Kiwi. Grapefruit. Cherries. Meats and Other Protein Sources  Seafood and shellfish. Lean meats. Poultry. Tofu. Dairy  Low-fat or fat-free dairy  products, such as yogurt, cottage cheese, and cheese. Beverages  Water. Sugar-free drinks. Tea. Coffee. Low-fat or skim milk. Milk alternatives, such as soy or almond milk. Real fruit juice. Condiments  Mustard. Relish. Low-fat, low-sugar ketchup and barbecue sauce. Low-fat or fat-free mayonnaise. Sweets and Desserts  Sugar-free sweets. Fats and Oils  Avocado. Canola or olive oil. Nuts and nut butters. Seeds. The items listed above may not be a complete list of recommended foods or beverages. Contact your dietitian for more options.  What foods are not recommended? Palm oil and coconut oil. Processed foods. Fried foods. Sweetened drinks, such as sweet tea, milkshakes, snow cones, iced sweet drinks, and sodas. Alcohol. Sweets. Foods that contain a lot of salt or sodium. The items listed above may not be a complete list of foods and beverages to avoid. Contact your dietitian for more information.  This information is not intended to replace advice given to you by your health care provider. Make sure you discuss any questions you have with your health care provider. Document Released: 05/30/2014 Document Revised: 06/21/2015 Document Reviewed: 02/07/2014  2017 Elsevier  Fatty Liver Introduction  Fatty liver, also called hepatic steatosis or steatohepatitis, is a condition in which too much fat has built up in your liver cells. The liver removes harmful substances from your bloodstream. It produces fluids your body needs. It also helps your body use and store energy from the food you eat. In many cases, fatty liver does not cause symptoms or problems. It is often diagnosed when tests are being done for other reasons. However, over time, fatty liver can cause inflammation that may lead to more serious liver problems, such as  scarring of the liver (cirrhosis). What are the causes? Causes of fatty liver may include:  Drinking too much alcohol.  Poor nutrition.  Obesity.  Cushing  syndrome.  Diabetes.  Hyperlipidemia.  Pregnancy.  Certain drugs.  Poisons.  Some viral infections. What increases the risk? You may be more likely to develop fatty liver if you:  Abuse alcohol.  Are pregnant.  Are overweight.  Have diabetes.  Have hepatitis.  Have a high triglyceride level. What are the signs or symptoms? Fatty liver often does not cause any symptoms. In cases where symptoms develop, they can include:  Fatigue.  Weakness.  Weight loss.  Confusion.  Abdominal pain.  Yellowing of your skin and the white parts of your eyes (jaundice).  Nausea and vomiting. How is this diagnosed? Fatty liver may be diagnosed by:  Physical exam and medical history.  Blood tests.  Imaging tests, such as an ultrasound, CT scan, or MRI.  Liver biopsy. A small sample of liver tissue is removed using a needle. The sample is then looked at under a microscope. How is this treated? Fatty liver is often caused by other health conditions. Treatment for fatty liver may involve medicines and lifestyle changes to manage conditions such as:  Alcoholism.  High cholesterol.  Diabetes.  Being overweight or obese. Follow these instructions at home:  Eat a healthy diet as directed by your health care provider.  Exercise regularly. This can help you lose weight and control your cholesterol and diabetes. Talk to your health care provider about an exercise plan and which activities are best for you.  Do not drink alcohol.  Take medicines only as directed by your health care provider. Contact a health care provider if: You have difficulty controlling your:  Blood sugar.  Cholesterol.  Alcohol consumption. Get help right away if:  You have abdominal pain.  You have jaundice.  You have nausea and vomiting. This information is not intended to replace advice given to you by your health care provider. Make sure you discuss any questions you have with your health  care provider.

## 2016-11-04 NOTE — Progress Notes (Addendum)
Subjective:    Patient ID: Ronald Pruitt., male    DOB: 16-Feb-1970, 46 y.o.   MRN: [id number]  CC: Ronald Pruitt. is a 46 y.o. male who presents today for follow up.   HPI: Fatty liver disease - unaware of diagnosis. No abdominal pain, n,v  Father had NAFLD, cirrhosis so very concerned.    Needs refills of zyrtec and janumet.     HISTORY:  Past Medical History:  Diagnosis Date  . Arthritis   . BPH (benign prostatic hyperplasia)   . Diabetes ([place])   . Dysuria   . GERD (gastroesophageal reflux disease)   . HTN (hypertension)   . Hyperlipidemia   . Impotence   . Nocturia   . Obesity   . Sleep apnea   . Testicular hypofunction    Past Surgical History:  Procedure Laterality Date  . CHOLECYSTECTOMY    . VASECTOMY     Family History  Problem Relation Age of Onset  . Hyperlipidemia Mother   . Diabetes Mellitus II Mother   . Seizures Mother   . Diabetes Mother   . Mental illness Mother   . Arthritis Mother   . Diabetes Father   . Cirrhosis Father        non alcoholic  . Hyperlipidemia Father   . Arthritis Father   . Liver cancer Father   . Diabetes Maternal Grandmother   . Hyperlipidemia Maternal Grandmother   . Arthritis Maternal Grandmother   . Diabetes Maternal Grandfather   . Hyperlipidemia Maternal Grandfather   . Arthritis Maternal Grandfather   . Diabetes Paternal Grandmother   . Hyperlipidemia Paternal Grandmother   . Arthritis Paternal Grandmother   . Diabetes Paternal Grandfather   . Hyperlipidemia Paternal Grandfather   . Arthritis Paternal Grandfather   . Kidney disease Neg Hx   . Prostate cancer Neg Hx   . Kidney cancer Neg Hx   . Bladder Cancer Neg Hx   . Colon cancer Neg Hx     Allergies: Patient has no known allergies. Current Outpatient Prescriptions on File Prior to Visit  Medication Sig Dispense Refill  . amLODipine (NORVASC) 10 MG tablet Take 10 mg by mouth daily.    Marland Kitchen atorvastatin (LIPITOR) 40 MG tablet Take 1 tablet  (40 mg total) by mouth daily. 90 tablet 1  . clomiPHENE (CLOMID) 50 MG tablet TAKE 1 TABLET BY MOUTH DAILY 30 tablet 0  . Desmopressin Acetate (NOCTIVA) 0.83 MCG/0.1ML EMUL Place 1 spray into the nose daily. 3.8 g 11  . empagliflozin (JARDIANCE) 25 MG TABS tablet Take 25 mg by mouth daily.    . fluticasone (FLONASE) 50 MCG/ACT nasal spray Place into both nostrils daily.    Marland Kitchen omeprazole (PRILOSEC) 20 MG capsule Take 40 mg by mouth daily.     . pioglitazone (ACTOS) 30 MG tablet Take 30 mg by mouth daily.    . tadalafil (CIALIS) 20 MG tablet Take 1 tablet (20 mg total) by mouth daily as needed for erectile dysfunction. 6 tablet 11   No current facility-administered medications on file prior to visit.     Social History  Substance Use Topics  . Smoking status: Current Every Day Smoker    Packs/day: 1.00    Types: Cigarettes  . Smokeless tobacco: Never Used  . Alcohol use No    Review of Systems  Constitutional: Negative for chills and fever.  Respiratory: Negative for cough.   Cardiovascular: Negative for chest pain and palpitations.  Gastrointestinal:  Negative for abdominal pain, nausea and vomiting.      Objective:    BP (!) 148/98   Pulse 69   Temp 98.1 F (36.7 C) (Oral)   Ht 6' (1.829 m)   Wt 240 lb 6.4 oz (109 kg)   SpO2 98%   BMI 32.60 kg/m  BP Readings from Last 3 Encounters:  11/04/16 (!) 148/98  10/22/16 120/82  09/15/16 126/84   Wt Readings from Last 3 Encounters:  11/04/16 240 lb 6.4 oz (109 kg)  10/22/16 244 lb 11.2 oz (111 kg)  09/15/16 238 lb (108 kg)    Physical Exam  Constitutional: He appears well-developed and well-nourished.  Cardiovascular: Regular rhythm and normal heart sounds.   Pulmonary/Chest: Effort normal and breath sounds normal. No respiratory distress. He has no wheezes. He has no rhonchi. He has no rales.  Neurological: He is alert.  Skin: Skin is warm and dry.  Psychiatric: He has a normal mood and affect. His speech is normal and  behavior is normal.  Vitals reviewed.      Assessment & Plan:   Problem List Items Addressed This Visit      Digestive   Fatty liver disease, nonalcoholic    No focal infiltrate on [place]. Discussed fatty liver lifestyle modifications including low trans fat diet, maintaining a healthy weight. Emphasized importance of keeping cholesterol, BP, and A1c at goal. Will continue to follow. IN context of DM and family history, agreed GI consult next step.        Relevant Orders   Ambulatory referral to Gastroenterology     Endocrine   DM (diabetes mellitus) ([place]) - Primary   Relevant Medications   sitaGLIPtin-metformin (JANUMET) 50-1000 MG tablet    Other Visit Diagnoses    Seasonal allergies       Relevant Medications   cetirizine (ZYRTEC) 10 MG tablet       I have discontinued [name]'s lidocaine, predniSONE, and meloxicam. I have also changed his sitaGLIPtin-metformin and cetirizine. Additionally, I am having him maintain his amLODipine, omeprazole, empagliflozin, pioglitazone, fluticasone, atorvastatin, clomiPHENE, tadalafil, and Desmopressin Acetate.   Meds ordered this encounter  Medications  . sitaGLIPtin-metformin (JANUMET) 50-1000 MG tablet    Sig: Take 1 tablet by mouth daily. Reported on 03/14/2015    Dispense:  30 tablet    Refill:  3    Order Specific Question:   Supervising Provider    Answer:   Deborra Medina L [[id number]]  . cetirizine (ZYRTEC) 10 MG tablet    Sig: Take 1 tablet (10 mg total) by mouth daily.    Dispense:  30 tablet    Refill:  5    Order Specific Question:   Supervising Provider    Answer:   Crecencio Mc [[id number]]    Return precautions given.   Risks, benefits, and alternatives of the medications and treatment plan prescribed today were discussed, and patient expressed understanding.   Education regarding symptom management and diagnosis given to patient on AVS.  Continue to follow with Burnard Hawthorne, FNP for routine health maintenance.     Ronald Pruitt. and I agreed with plan.   Mable Paris, FNP

## 2016-11-04 NOTE — Progress Notes (Signed)
Pre visit review using our clinic review tool, if applicable. No additional management support is needed unless otherwise documented below in the visit note. 

## 2016-11-04 NOTE — Assessment & Plan Note (Addendum)
No focal infiltrate on [place]. Discussed fatty liver lifestyle modifications including low trans fat diet, maintaining a healthy weight. Emphasized importance of keeping cholesterol, BP, and A1c at goal. Will continue to follow. IN context of DM and family history, agreed GI consult next step.

## 2016-11-05 ENCOUNTER — Other Ambulatory Visit: Payer: Self-pay | Admitting: Urology

## 2016-11-05 ENCOUNTER — Encounter: Payer: Self-pay | Admitting: Family

## 2016-11-05 DIAGNOSIS — E291 Testicular hypofunction: Secondary | ICD-10-CM

## 2016-11-05 NOTE — Progress Notes (Signed)
Patient has been notified

## 2016-11-17 ENCOUNTER — Other Ambulatory Visit: Payer: Self-pay | Admitting: Urology

## 2016-11-17 DIAGNOSIS — E291 Testicular hypofunction: Secondary | ICD-10-CM

## 2016-11-18 ENCOUNTER — Ambulatory Visit: Payer: BLUE CROSS/BLUE SHIELD | Admitting: Family

## 2016-12-23 ENCOUNTER — Ambulatory Visit: Payer: 59 | Admitting: Gastroenterology

## 2017-01-10 ENCOUNTER — Other Ambulatory Visit: Payer: Self-pay | Admitting: Urology

## 2017-01-10 DIAGNOSIS — E291 Testicular hypofunction: Secondary | ICD-10-CM

## 2017-01-12 ENCOUNTER — Encounter: Payer: Self-pay | Admitting: Family

## 2017-01-12 ENCOUNTER — Other Ambulatory Visit: Payer: Self-pay

## 2017-01-12 ENCOUNTER — Encounter: Payer: Self-pay | Admitting: Urology

## 2017-01-12 DIAGNOSIS — E291 Testicular hypofunction: Secondary | ICD-10-CM

## 2017-01-12 MED ORDER — CLOMIPHENE CITRATE 50 MG PO TABS: 50.0000 mg | ORAL_TABLET | Freq: Every day | ORAL | 3 refills | Status: DC

## 2017-01-12 MED ORDER — OMEPRAZOLE 20 MG PO CPDR: 40.0000 mg | DELAYED_RELEASE_CAPSULE | Freq: Every day | ORAL | 2 refills | Status: DC

## 2017-01-14 ENCOUNTER — Other Ambulatory Visit
Admission: RE | Admit: 2017-01-14 | Discharge: 2017-01-14 | Disposition: A | Discharge status: Home / Self Care | Payer: 59 | Source: Ambulatory Visit | Attending: Gastroenterology | Admitting: Gastroenterology

## 2017-01-14 ENCOUNTER — Encounter: Payer: Self-pay | Admitting: Gastroenterology

## 2017-01-14 ENCOUNTER — Ambulatory Visit (INDEPENDENT_AMBULATORY_CARE_PROVIDER_SITE_OTHER): Payer: 59 | Admitting: Gastroenterology

## 2017-01-14 VITALS — BP 142/87 | HR 86 | Ht 72.0 in | Wt 240.5 lb

## 2017-01-14 DIAGNOSIS — K760 Fatty (change of) liver, not elsewhere classified: Secondary | ICD-10-CM | POA: Insufficient documentation

## 2017-01-14 NOTE — Progress Notes (Signed)
Gastroenterology Consultation  Referring Provider:     Burnard Hawthorne, FNP Primary Care Physician:  Burnard Hawthorne, FNP Primary Gastroenterologist:  Dr. Allen Norris     Reason for Consultation:     Fatty liver        HPI:   Ronald Pruitt. is a 46 y.o. y/o male referred for consultation & management of fatty liver by Dr. Vidal Schwalbe, Yvetta Coder, FNP.  This patient comes in today with a history of fatty liver found on a imaging done in 2014 with a repeat imaging showing fatty liver in 2017 and again in 2018. The patient's liver enzymes have been historically normal. The patient also has a family history with his father and his uncle died from cirrhosis from fatty liver. There is no report of any jaundice black stools or bloody stools. The patient denies any alcohol use or abuse. The patient has not been seen in the past for his fatty liver. The patient reports that he has a first responder and is concerned about hepatitis C also.  Past Medical History:  Diagnosis Date  . Arthritis   . BPH (benign prostatic hyperplasia)   . Diabetes ([place])   . Dysuria   . GERD (gastroesophageal reflux disease)   . HTN (hypertension)   . Hyperlipidemia   . Impotence   . Nocturia   . Obesity   . Sleep apnea   . Testicular hypofunction     Past Surgical History:  Procedure Laterality Date  . CHOLECYSTECTOMY    . VASECTOMY      Prior to Admission medications   Medication Sig Start Date End Date Taking? Authorizing Provider  amLODipine (NORVASC) 10 MG tablet Take 10 mg by mouth daily.   Yes [provider]  atorvastatin (LIPITOR) 40 MG tablet Take 1 tablet (40 mg total) by mouth daily. 08/26/16  Yes Arnett, Yvetta Coder, FNP  cetirizine (ZYRTEC) 10 MG tablet Take 1 tablet (10 mg total) by mouth daily. 11/04/16  Yes Burnard Hawthorne, FNP  clomiPHENE (CLOMID) 50 MG tablet Take 1 tablet (50 mg total) by mouth daily. 01/12/17  Yes McGowan, Larene Beach A, PA-C  Desmopressin Acetate (NOCTIVA) 0.83  MCG/0.1ML EMUL Place 1 spray into the nose daily. 10/22/16  Yes McGowan, Larene Beach A, PA-C  empagliflozin (JARDIANCE) 25 MG TABS tablet Take 25 mg by mouth daily.   Yes [provider]  fluticasone (FLONASE) 50 MCG/ACT nasal spray Place into both nostrils daily.   Yes [provider]  omeprazole (PRILOSEC) 20 MG capsule Take 2 capsules (40 mg total) by mouth daily. 01/12/17  Yes Crecencio Mc, MD  pioglitazone (ACTOS) 30 MG tablet Take 30 mg by mouth daily.   Yes [provider]  sitaGLIPtin-metformin (JANUMET) 50-1000 MG tablet Take 1 tablet by mouth daily. Reported on 03/14/2015 11/04/16  Yes Arnett, Yvetta Coder, FNP  tadalafil (CIALIS) 20 MG tablet Take 1 tablet (20 mg total) by mouth daily as needed for erectile dysfunction. 10/22/16  Yes McGowan, Hunt Oris, PA-C    Family History  Problem Relation Age of Onset  . Hyperlipidemia Mother   . Diabetes Mellitus II Mother   . Seizures Mother   . Diabetes Mother   . Mental illness Mother   . Arthritis Mother   . Diabetes Father   . Cirrhosis Father        non alcoholic  . Hyperlipidemia Father   . Arthritis Father   . Liver cancer Father   . Diabetes Maternal  Grandmother   . Hyperlipidemia Maternal Grandmother   . Arthritis Maternal Grandmother   . Diabetes Maternal Grandfather   . Hyperlipidemia Maternal Grandfather   . Arthritis Maternal Grandfather   . Diabetes Paternal Grandmother   . Hyperlipidemia Paternal Grandmother   . Arthritis Paternal Grandmother   . Diabetes Paternal Grandfather   . Hyperlipidemia Paternal Grandfather   . Arthritis Paternal Grandfather   . Kidney disease Neg Hx   . Prostate cancer Neg Hx   . Kidney cancer Neg Hx   . Bladder Cancer Neg Hx   . Colon cancer Neg Hx      Social History   Tobacco Use  . Smoking status: Current Every Day Smoker    Packs/day: 1.00    Types: Cigarettes  . Smokeless tobacco: Never Used  Substance Use Topics  . Alcohol use: No    Alcohol/week:  0.0 oz  . Drug use: No    Allergies as of 01/14/2017  . (No Known Allergies)    Review of Systems:    All systems reviewed and negative except where noted in HPI.   Physical Exam:  BP (!) 142/87   Pulse 86   Ht 6' (1.829 m)   Wt 240 lb 8 oz (109.1 kg)   BMI 32.62 kg/m  No LMP for male patient. Psych:  Alert and cooperative. Normal mood and affect. General:   Alert,  Well-developed, well-nourished, pleasant and cooperative in NAD Head:  Normocephalic and atraumatic. Eyes:  Sclera clear, no icterus.   Conjunctiva pink. Ears:  Normal auditory acuity. Nose:  No deformity, discharge, or lesions. Mouth:  No deformity or lesions,oropharynx pink & moist. Neck:  Supple; no masses or thyromegaly. Lungs:  Respirations even and unlabored.  Clear throughout to auscultation.   No wheezes, crackles, or rhonchi. No acute distress. Heart:  Regular rate and rhythm; no murmurs, clicks, rubs, or gallops. Abdomen:  Normal bowel sounds.  No bruits.  Soft, non-tender and non-distended without masses, hepatosplenomegaly or hernias noted.  No guarding or rebound tenderness.  Negative Carnett sign.   Rectal:  Deferred.  Msk:  Symmetrical without gross deformities.  Good, equal movement & strength bilaterally. Pulses:  Normal pulses noted. Extremities:  No clubbing or edema.  No cyanosis. Neurologic:  Alert and oriented x3;  grossly normal neurologically. Skin:  Intact without significant lesions or rashes.  No jaundice. Lymph Nodes:  No significant cervical adenopathy. Psych:  Alert and cooperative. Normal mood and affect.  Imaging Studies: No results found.  Assessment and Plan:   Ronald Pruitt. is a 46 y.o. y/o male who has been found to have hepatic steatosis on multiple imaging exams over the last 4 years. The patient's liver enzymes have remained normal. The patient does suffer from diabetes and hypercholesterolemia with increased triglycerides. The patient has been told that weight loss  with keeping his diabetes and lipids under control would help decrease the fat in his liver. The patient's BMI is 32.6 at this time. The patient has also been told that it would be much more worrisome if his liver enzymes are elevated which they are not signifying that minimal if any damage is being incurred by the fatty liver. The patient will have his labs checked for hepatitis B surface antibody and hepatitis A antibody in addition to his hepatitis C antibody and he will need vaccinations of hepatitis A or B if he is not immune. Patient should also have his liver enzymes checked every 6 months to a  year to make sure that he has not developed steatohepatitis. The patient has been explained the plan and agrees with it  Lucilla Lame, MD. Ronald Pruitt   Note: This dictation was prepared with Dragon dictation along with smaller phrase technology. Any transcriptional errors that result from this process are unintentional.

## 2017-01-15 LAB — HEPATITIS B SURFACE ANTIBODY,QUALITATIVE: HEP B S AB: NONREACTIVE

## 2017-01-15 LAB — HEPATITIS C ANTIBODY: HCV Ab: <0.1 s/co ratio (ref 0.0–0.9)

## 2017-01-15 LAB — HEPATITIS A ANTIBODY, TOTAL: Hep A Total Ab: NEGATIVE

## 2017-01-16 ENCOUNTER — Encounter: Payer: Self-pay | Admitting: Gastroenterology

## 2017-01-22 ENCOUNTER — Telehealth: Payer: Self-pay

## 2017-01-22 NOTE — Telephone Encounter (Signed)
Pt notified of lab results via mychart. 

## 2017-01-22 NOTE — Telephone Encounter (Signed)
-----   Message from Lucilla Lame, MD sent at 01/15/2017  5:33 PM EST ----- The patient know that his hepatitis C is negative and he is not immune to hepatitis B or A therefore needs vaccinations for both.

## 2017-02-25 ENCOUNTER — Other Ambulatory Visit: Payer: Self-pay | Admitting: Internal Medicine

## 2017-02-28 ENCOUNTER — Encounter: Payer: Self-pay | Admitting: Urology

## 2017-03-08 ENCOUNTER — Encounter: Payer: Self-pay | Admitting: Urology

## 2017-03-09 ENCOUNTER — Other Ambulatory Visit: Payer: Self-pay | Admitting: Urology

## 2017-03-09 DIAGNOSIS — E291 Testicular hypofunction: Secondary | ICD-10-CM

## 2017-03-09 MED ORDER — CLOMIPHENE CITRATE 50 MG PO TABS: 50.0000 mg | ORAL_TABLET | Freq: Every day | ORAL | 3 refills | Status: DC

## 2017-03-10 ENCOUNTER — Other Ambulatory Visit: Payer: Self-pay | Admitting: Family

## 2017-03-10 DIAGNOSIS — E785 Hyperlipidemia, unspecified: Secondary | ICD-10-CM

## 2017-03-11 ENCOUNTER — Ambulatory Visit (INDEPENDENT_AMBULATORY_CARE_PROVIDER_SITE_OTHER): Payer: 59 | Admitting: Internal Medicine

## 2017-03-11 ENCOUNTER — Encounter: Payer: Self-pay | Admitting: Internal Medicine

## 2017-03-11 VITALS — BP 136/88 | HR 95 | Temp 98.2°F | Ht 72.0 in | Wt 237.2 lb

## 2017-03-11 DIAGNOSIS — E118 Type 2 diabetes mellitus with unspecified complications: Secondary | ICD-10-CM

## 2017-03-11 DIAGNOSIS — J329 Chronic sinusitis, unspecified: Secondary | ICD-10-CM | POA: Diagnosis not present

## 2017-03-11 DIAGNOSIS — R350 Frequency of micturition: Secondary | ICD-10-CM

## 2017-03-11 DIAGNOSIS — N481 Balanitis: Secondary | ICD-10-CM

## 2017-03-11 DIAGNOSIS — E1165 Type 2 diabetes mellitus with hyperglycemia: Secondary | ICD-10-CM

## 2017-03-11 DIAGNOSIS — Z1329 Encounter for screening for other suspected endocrine disorder: Secondary | ICD-10-CM | POA: Diagnosis not present

## 2017-03-11 DIAGNOSIS — E785 Hyperlipidemia, unspecified: Secondary | ICD-10-CM | POA: Diagnosis not present

## 2017-03-11 LAB — URINALYSIS, ROUTINE W REFLEX MICROSCOPIC
Bilirubin Urine: NEGATIVE
HGB URINE DIPSTICK: NEGATIVE
Ketones, ur: NEGATIVE
LEUKOCYTES UA: NEGATIVE
NITRITE: NEGATIVE
PH: 6.5 (ref 5.0–8.0)
RBC / HPF: NONE SEEN (ref 0–?)
Specific Gravity, Urine: <=1.005 — AB (ref 1.000–1.030)
TOTAL PROTEIN, URINE-UPE24: NEGATIVE
Urobilinogen, UA: 0.2 (ref 0.0–1.0)
WBC, UA: NONE SEEN (ref 0–?)

## 2017-03-11 LAB — COMPREHENSIVE METABOLIC PANEL
ALK PHOS: 85 U/L (ref 39–117)
ALT: 22 U/L (ref 0–53)
AST: 26 U/L (ref 0–37)
Albumin: 3.8 g/dL (ref 3.5–5.2)
BUN: 14 mg/dL (ref 6–23)
CO2: 26 mEq/L (ref 19–32)
CREATININE: 0.98 mg/dL (ref 0.40–1.50)
Calcium: 8.8 mg/dL (ref 8.4–10.5)
Chloride: 102 mEq/L (ref 96–112)
GFR: 86.49 mL/min (ref >60.00)
Glucose, Bld: 454 mg/dL — ABNORMAL HIGH (ref 70–99)
Potassium: 4.1 mEq/L (ref 3.5–5.1)
SODIUM: 136 meq/L (ref 135–145)
TOTAL PROTEIN: 6.9 g/dL (ref 6.0–8.3)
Total Bilirubin: 0.4 mg/dL (ref 0.2–1.2)

## 2017-03-11 LAB — HEMOGLOBIN A1C: HEMOGLOBIN A1C: 11.7 % — AB (ref 4.6–6.5)

## 2017-03-11 LAB — CBC WITH DIFFERENTIAL/PLATELET
Basophils Absolute: 0 10*3/uL (ref 0.0–0.1)
Basophils Relative: 0.7 % (ref 0.0–3.0)
EOS ABS: 0.2 10*3/uL (ref 0.0–0.7)
Eosinophils Relative: 3.2 % (ref 0.0–5.0)
HCT: 44.7 % (ref 39.0–52.0)
HEMOGLOBIN: 14.9 g/dL (ref 13.0–17.0)
Lymphocytes Relative: 21 % (ref 12.0–46.0)
Lymphs Abs: 1.4 10*3/uL (ref 0.7–4.0)
MCHC: 33.4 g/dL (ref 30.0–36.0)
MCV: 90.7 fl (ref 78.0–100.0)
MONO ABS: 0.5 10*3/uL (ref 0.1–1.0)
Monocytes Relative: 7.5 % (ref 3.0–12.0)
NEUTROS PCT: 67.6 % (ref 43.0–77.0)
Neutro Abs: 4.4 10*3/uL (ref 1.4–7.7)
Platelets: 171 10*3/uL (ref 150.0–400.0)
RBC: 4.93 Mil/uL (ref 4.22–5.81)
RDW: 13.8 % (ref 11.5–15.5)
WBC: 6.5 10*3/uL (ref 4.0–10.5)

## 2017-03-11 LAB — TSH: TSH: 1.78 u[IU]/mL (ref 0.35–4.50)

## 2017-03-11 LAB — MICROALBUMIN / CREATININE URINE RATIO
Creatinine,U: 29.6 mg/dL
Microalb Creat Ratio: 2.4 mg/g (ref 0.0–30.0)
Microalb, Ur: <0.7 mg/dL (ref 0.0–1.9)

## 2017-03-11 MED ORDER — AMOXICILLIN-POT CLAVULANATE 875-125 MG PO TABS: 1.0000 | ORAL_TABLET | Freq: Two times a day (BID) | ORAL | 0 refills | Status: DC

## 2017-03-11 MED ORDER — ATORVASTATIN CALCIUM 40 MG PO TABS: 40.0000 mg | ORAL_TABLET | Freq: Every day | ORAL | 1 refills | Status: DC

## 2017-03-11 MED ORDER — SITAGLIPTIN PHOS-METFORMIN HCL 50-1000 MG PO TABS: 1.0000 | ORAL_TABLET | Freq: Every day | ORAL | 0 refills | Status: DC

## 2017-03-11 MED ORDER — CLOTRIMAZOLE 1 % EX CREA: 1.0000 "application " | TOPICAL_CREAM | Freq: Two times a day (BID) | CUTANEOUS | 0 refills | Status: DC

## 2017-03-11 MED ORDER — PIOGLITAZONE HCL 30 MG PO TABS: 30.0000 mg | ORAL_TABLET | Freq: Every day | ORAL | 0 refills | Status: DC

## 2017-03-11 NOTE — Progress Notes (Signed)
Chief Complaint  Patient presents with  . Sinusitis  . Urinary Frequency  . Diabetes  . penis problem   F/u multiple issues today  1. Sinus pressure, congestion unable to smell, feels like sore in nose and inside nose is sore, runny nose at times and nasal passages feel dry. He has not tried any medication sx's x 2 months, He has not tried zyrtec denies h/o allergies.  He is on CPAP with humidification which is hard to sure due to sx's  2. DM 2 on Actos 30 mg qd, Jardiance 25 mg qd x a while no A1C on file and he has been unable to check sugars at home. He reports insurance would not cover Janumet 50-1000 so has not been taking x 6 months.  He reports irritation around the head of his penis and white discharge as well and increased freq urinaration q1-1.5 hours and having trouble sleeping at night due to increased urination. He also reports increased thirst urgency and incontinence.   3. He does f/u with urology as well for ED, BPH, and low testosterone.    Review of Systems  Constitutional: Negative for fever.  HENT: Positive for congestion and sinus pain.        +sore in nose   Respiratory: Negative for shortness of breath.   Cardiovascular: Negative for chest pain.  Gastrointestinal: Negative for abdominal pain.  Genitourinary: Positive for frequency and urgency.       +nocturia  +white discharge around penis   Neurological: Positive for headaches.  Psychiatric/Behavioral: Negative for memory loss.   Past Medical History:  Diagnosis Date  . Arthritis   . BPH (benign prostatic hyperplasia)   . Diabetes ([place])   . Dysuria   . GERD (gastroesophageal reflux disease)   . HTN (hypertension)   . Hyperlipidemia   . Impotence   . Nocturia   . Obesity   . Sleep apnea   . Testicular hypofunction    Past Surgical History:  Procedure Laterality Date  . CHOLECYSTECTOMY    . VASECTOMY     Family History  Problem Relation Age of Onset  . Hyperlipidemia Mother   . Diabetes Mellitus  II Mother   . Seizures Mother   . Diabetes Mother   . Mental illness Mother   . Arthritis Mother   . Diabetes Father   . Cirrhosis Father        non alcoholic  . Hyperlipidemia Father   . Arthritis Father   . Liver cancer Father   . Diabetes Maternal Grandmother   . Hyperlipidemia Maternal Grandmother   . Arthritis Maternal Grandmother   . Diabetes Maternal Grandfather   . Hyperlipidemia Maternal Grandfather   . Arthritis Maternal Grandfather   . Diabetes Paternal Grandmother   . Hyperlipidemia Paternal Grandmother   . Arthritis Paternal Grandmother   . Diabetes Paternal Grandfather   . Hyperlipidemia Paternal Grandfather   . Arthritis Paternal Grandfather   . Kidney disease Neg Hx   . Prostate cancer Neg Hx   . Kidney cancer Neg Hx   . Bladder Cancer Neg Hx   . Colon cancer Neg Hx    Social History   Socioeconomic History  . Marital status: Significant Other    Spouse name: Not on file  . Number of children: Not on file  . Years of education: Not on file  . Highest education level: Not on file  Social Needs  . Financial resource strain: Not on file  . Food insecurity -  worry: Not on file  . Food insecurity - inability: Not on file  . Transportation needs - medical: Not on file  . Transportation needs - non-medical: Not on file  Occupational History  . Not on file  Tobacco Use  . Smoking status: Current Every Day Smoker    Packs/day: 1.00    Types: Cigarettes  . Smokeless tobacco: Never Used  Substance and Sexual Activity  . Alcohol use: No    Alcohol/week: 0.0 oz  . Drug use: No  . Sexual activity: Not on file  Other Topics Concern  . 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 amLODipine (NORVASC) 10 MG tablet Take 10 mg by mouth daily.  Marland Kitchen atorvastatin (LIPITOR) 40 MG tablet Take 1 tablet (40 mg total) by mouth at bedtime.  . cetirizine (ZYRTEC) 10 MG tablet Take 1 tablet (10  mg total) by mouth daily.  . clomiPHENE (CLOMID) 50 MG tablet Take 1 tablet (50 mg total) by mouth daily.  Marland Kitchen Desmopressin Acetate (NOCTIVA) 0.83 MCG/0.1ML EMUL Place 1 spray into the nose daily.  . fluticasone (FLONASE) 50 MCG/ACT nasal spray Place into both nostrils daily.  Marland Kitchen omeprazole (PRILOSEC) 20 MG capsule TAKE 2 CAPSULES BY MOUTH EVERY DAY  . pioglitazone (ACTOS) 30 MG tablet Take 1 tablet (30 mg total) by mouth daily.  . sitaGLIPtin-metformin (JANUMET) 50-1000 MG tablet Take 1 tablet by mouth daily. In am with food  . tadalafil (CIALIS) 20 MG tablet Take 1 tablet (20 mg total) by mouth daily as needed for erectile dysfunction.  . [DISCONTINUED] atorvastatin (LIPITOR) 40 MG tablet Take 1 tablet (40 mg total) by mouth daily.  . [DISCONTINUED] empagliflozin (JARDIANCE) 25 MG TABS tablet Take 25 mg by mouth daily.  . [DISCONTINUED] pioglitazone (ACTOS) 30 MG tablet Take 30 mg by mouth daily.  . [DISCONTINUED] sitaGLIPtin-metformin (JANUMET) 50-1000 MG tablet Take 1 tablet by mouth daily. Reported on 03/14/2015   No Known Allergies Recent Results (from the past 2160 hour(s))  Hepatitis C antibody     Status: None   Collection Time: 01/14/17 10:44 AM  Result Value Ref Range   HCV Ab <0.1 0.0 - 0.9 s/co ratio    Comment: (NOTE)                                  Negative:     < 0.8                             Indeterminate: 0.8 - 0.9                                  Positive:     > 0.9 The CDC recommends that a positive HCV antibody result be followed up with a HCV Nucleic Acid Amplification test ([id number]). Performed At: [facility] [place], [place] [id number] Rush Farmer MD [id number]   Hepatitis B surface antibody     Status: None   Collection Time: 01/14/17 10:44 AM  Result Value Ref Range   Hep B S Ab Non Reactive     Comment: (NOTE)              Non Reactive: Inconsistent with immunity,  less than 10 mIU/mL               Reactive:     Consistent with immunity,                            greater than 9.9 mIU/mL Performed At: [facility] [place], [place] [id number] Rush Farmer MD WU:[id number]   Hepatitis A antibody, total     Status: None   Collection Time: 01/14/17 10:44 AM  Result Value Ref Range   Hep A Total Ab Negative Negative    Comment: (NOTE) Performed At: [facility] [place], [place] [id number] Rush Farmer MD [id number]    Objective  Body mass index is 32.17 kg/m. Wt Readings from Last 3 Encounters:  03/11/17 237 lb 3.2 oz (107.6 kg)  01/14/17 240 lb 8 oz (109.1 kg)  11/04/16 240 lb 6.4 oz (109 kg)   Temp Readings from Last 3 Encounters:  03/11/17 98.2 F (36.8 C) (Oral)  11/04/16 98.1 F (36.7 C) (Oral)  09/15/16 98 F (36.7 C) (Oral)   BP Readings from Last 3 Encounters:  03/11/17 136/88  01/14/17 (!) 142/87  11/04/16 (!) 148/98   Pulse Readings from Last 3 Encounters:  03/11/17 95  01/14/17 86  11/04/16 69   O2 sat room air 94%   Physical Exam  Constitutional: He is oriented to person, place, and time and well-developed, well-nourished, and in no distress. Vital signs are normal.  HENT:  Head: Normocephalic and atraumatic.  Nose: Right sinus exhibits maxillary sinus tenderness and frontal sinus tenderness. Left sinus exhibits maxillary sinus tenderness and frontal sinus tenderness.  Thick nasal discharge mucous,  Possibly sore left nare  Eyes: Conjunctivae are normal. Pupils are equal, round, and reactive to light.  Cardiovascular: Normal rate, regular rhythm and normal heart sounds.  Pulmonary/Chest: Effort normal and breath sounds normal.  Genitourinary: White and discharge found.  Genitourinary Comments: +balanitis around penis head white discharge   Neurological: He is alert and oriented to person, place, and time. Gait normal. Gait normal.  Skin: Skin is warm, dry and intact.  Psychiatric: Mood, memory,  affect and judgment normal.  Nursing note and vitals reviewed.   Assessment   1. Sinusitis  2. DM2 c/w yeast balanitis, UTI on Jardiance 25 mg qd (see HPI) 3. Increased urine freq and nocturia per urology notes h/o BPH vs related jardiance use  4. H M Plan  1. Augmentin bid x 10 days  NS, Flonase  Trial of sudafed x 3 days or less  2.  Try to get janumet 50-1000 mg qd approved  Stop jardiance 25 mg qd for now  Cont actos 30 mg qd  Check UA/culture  tx with augmentin, Clotrimazole can use HC 1% bid prn for itching  Disc eye exam and needs foot exam in future  See labs below   3 See above  Consider flomax if related to BPH if stopping jardiance does not help  4.  Need to disc vaccines at f/u  PSA had 10/15/16 0.3 f/u urology   Check labs today CMET, CBC, A1C, UA, culture, urine protein, TSH Provider: Dr. Olivia Mackie McLean-Scocuzza-Internal Medicine

## 2017-03-11 NOTE — Progress Notes (Signed)
Pre visit review using our clinic review tool, if applicable. No additional management support is needed unless otherwise documented below in the visit note. 

## 2017-03-11 NOTE — Patient Instructions (Addendum)
You can use hydrocortisone 1% 2x per day to penis for itching if needed Use Clotrimazole 2x per day to penis  Take Augmentin 2x per day x 10 days  Try Sudafed x 10 days with food  F/u in 2 weeks Stop [place] to pharmacy   Balanitis Balanitis is swelling and irritation (inflammation) of the head of the penis (glans penis). The condition may also cause inflammation of the skin around the glans penis (foreskin) in men who have not been circumcised. It may develop because of an infection or another medical condition. Balanitis occurs most often among men who have not had their foreskin removed (uncircumcised men). Balanitis sometimes causes scarring of the penis or foreskin, which can require surgery. Untreated balanitis can increase the risk of penile cancer. What are the causes? Common causes of this condition include:  Poor personal hygiene, especially in uncircumcised men. Not cleaning the glans penis and foreskin well can result in buildup of bacteria, viruses, and yeast, which can lead to infection and inflammation.  Irritation and lack of air flow due to fluid (smegma) that can build up on the glans penis.  Other causes include:  Chemical irritation from products such as soaps or shower gels (especially those that have fragrance), condoms, personal lubricants, petroleum jelly, spermicides, or fabric softeners.  Skin conditions, such as eczema, dermatitis, and psoriasis.  Allergies to medicines, such as tetracycline and sulfa drugs.  Certain medical conditions, including liver cirrhosis, congestive heart failure, diabetes, and kidney disease.  Infections, such as candidiasis, HPV (human papillomavirus), herpes simplex, gonorrhea, and syphilis.  Severe obesity.  What increases the risk? The following factors may make you more likely to develop this condition:  Having diabetes. This is the most common risk factor.  Having a tight foreskin that is difficult to pull  back (retract) past the glans.  Having sexual intercourse without using a condom.  What are the signs or symptoms? Symptoms of this condition include:  Discharge from under the foreskin.  A bad smell.  Pain or difficulty retracting the foreskin.  Tenderness, redness, and swelling of the glans.  A rash or sores on the glans or foreskin.  Itchiness.  Inability to get an erection due to pain.  Difficulty urinating.  Scarring of the penis or foreskin, in some cases.  How is this diagnosed? This condition may be diagnosed based on:  A physical exam.  Testing a swab of discharge to check for bacterial or fungal infection.  Blood tests: ? To check for viruses that can cause balanitis. ? To check your blood sugar (glucose) level. High blood glucose could be a sign of diabetes, which can cause balanitis.  How is this treated? Treatment for balanitis depends on the cause. Treatment may include:  Improving personal hygiene. Your health care provider may recommend sitting in a bath of warm water that is deep enough to cover your hips and buttocks (sitz bath).  Medicines such as: ? Creams or ointments to reduce swelling (steroids) or to treat an infection. ? Antibiotic medicine. ? Antifungal medicine.  Surgery to remove or cut the foreskin (circumcision). This may be done if you have scarring on the foreskin that makes it difficult to retract.  Controlling other medical problems that may be causing your condition or making it worse.  Follow these instructions at home:  Do not have sex until the condition clears up, or until your health care provider approves.  Keep your penis clean and dry. Take sitz baths  as recommended by your health care provider.  Avoid products that irritate your skin or make symptoms worse, such as soaps and shower gels that have fragrance.  Take over-the-counter and prescription medicines only as told by your health care provider. ? If you were  prescribed an antibiotic medicine or a cream or ointment, use it as told by your health care provider. Do not stop using your medicine, cream, or ointment even if you start to feel better. ? Do not drive or use heavy machinery while taking prescription pain medicine. Contact a health care provider if:  Your symptoms get worse or do not improve with home care.  You develop chills or a fever.  You have trouble urinating.  You cannot retract your foreskin. Get help right away if:  You develop severe pain.  You are unable to urinate. Summary  Balanitis is inflammation of the head of the penis (glans penis) caused by irritation or infection.  Balanitis causes pain, redness, and swelling of the glans penis.  This condition is most common among uncircumcised men who do not keep their glans penis clean and in men who have diabetes.  Treatment may include creams or ointments.  Good hygiene is important for prevention. This includes pulling back the foreskin when washing your penis. This information is not intended to replace advice given to you by your health care provider. Make sure you discuss any questions you have with your health care provider. Document Released: 06/01/2008 Document Revised: 12/03/2015 Document Reviewed: 12/03/2015 Elsevier Interactive Patient Education  2017 [employer].

## 2017-03-12 LAB — URINE CULTURE
MICRO NUMBER: 90193212
RESULT: NO GROWTH
SPECIMEN QUALITY:: ADEQUATE

## 2017-03-15 ENCOUNTER — Other Ambulatory Visit: Payer: Self-pay | Admitting: Internal Medicine

## 2017-03-15 DIAGNOSIS — E1165 Type 2 diabetes mellitus with hyperglycemia: Secondary | ICD-10-CM

## 2017-03-15 MED ORDER — BASAGLAR KWIKPEN 100 UNIT/ML ~~LOC~~ SOPN: 10.0000 [IU] | PEN_INJECTOR | SUBCUTANEOUS | 1 refills | Status: DC

## 2017-03-15 MED ORDER — INSULIN PEN NEEDLE 30G X 8 MM MISC: 3 refills | Status: DC

## 2017-03-15 MED ORDER — LIRAGLUTIDE 18 MG/3ML ~~LOC~~ SOPN: 0.6000 mg | PEN_INJECTOR | Freq: Every day | SUBCUTANEOUS | 1 refills | Status: DC

## 2017-03-25 ENCOUNTER — Encounter: Payer: Self-pay | Admitting: Family

## 2017-03-25 ENCOUNTER — Ambulatory Visit (INDEPENDENT_AMBULATORY_CARE_PROVIDER_SITE_OTHER): Payer: 59 | Admitting: Family

## 2017-03-25 VITALS — BP 132/90 | HR 84 | Temp 98.6°F | Resp 16 | Wt 238.1 lb

## 2017-03-25 DIAGNOSIS — E1165 Type 2 diabetes mellitus with hyperglycemia: Secondary | ICD-10-CM | POA: Diagnosis not present

## 2017-03-25 DIAGNOSIS — J0191 Acute recurrent sinusitis, unspecified: Secondary | ICD-10-CM | POA: Diagnosis not present

## 2017-03-25 MED ORDER — LIRAGLUTIDE 18 MG/3ML ~~LOC~~ SOPN: 1.2000 mg | PEN_INJECTOR | Freq: Every day | SUBCUTANEOUS | 1 refills | Status: DC

## 2017-03-25 MED ORDER — PREDNISONE 10 MG PO TABS: ORAL_TABLET | ORAL | 0 refills | Status: DC

## 2017-03-25 NOTE — Assessment & Plan Note (Signed)
No improvement on augmentin. Agreed may be inflammation post infection. We agreed short trial of prednisone, mucinex. CT Sinus due to persistent nature of symptoms last few months. Patient will let me know how he is doing

## 2017-03-25 NOTE — Assessment & Plan Note (Signed)
FGB in 200s - had been in 400s. Increased victoza today. Will remain on actos, victoza, glargine for now. Janumet has not been approved by insurance at this time. F.u one month

## 2017-03-25 NOTE — Progress Notes (Signed)
Subjective:    Patient ID: Ronald Karvonen., male    DOB: 1970/11/07, 47 y.o.   MRN: [id number]  CC: Ronald Wakefield. is a 47 y.o. male who presents today for follow up.   HPI: Nasal congestion, completed augmentin, hasn't improved. Past couple of months, notes cannot breath out of left side. Using flonase as well. No fever, chills, cough.   DM- reports FGB 283 at home this morningqq. Blood sugars had been in 400s. not on janumet Irritation at end of penis and urinary frequency as improved. On 10 units glargine in the morning with breakfast, actos, victoza.   No hypoglycemia.    Saw Dr Aundra Dubin last week     HISTORY:  Past Medical History:  Diagnosis Date  . Arthritis   . BPH (benign prostatic hyperplasia)   . Diabetes ([place])   . Dysuria   . GERD (gastroesophageal reflux disease)   . HTN (hypertension)   . Hyperlipidemia   . Impotence   . Nocturia   . Obesity   . Sleep apnea   . Testicular hypofunction    Past Surgical History:  Procedure Laterality Date  . CHOLECYSTECTOMY    . VASECTOMY     Family History  Problem Relation Age of Onset  . Hyperlipidemia Mother   . Diabetes Mellitus II Mother   . Seizures Mother   . Diabetes Mother   . Mental illness Mother   . Arthritis Mother   . Diabetes Father   . Cirrhosis Father        non alcoholic  . Hyperlipidemia Father   . Arthritis Father   . Liver cancer Father   . Diabetes Maternal Grandmother   . Hyperlipidemia Maternal Grandmother   . Arthritis Maternal Grandmother   . Diabetes Maternal Grandfather   . Hyperlipidemia Maternal Grandfather   . Arthritis Maternal Grandfather   . Diabetes Paternal Grandmother   . Hyperlipidemia Paternal Grandmother   . Arthritis Paternal Grandmother   . Diabetes Paternal Grandfather   . Hyperlipidemia Paternal Grandfather   . Arthritis Paternal Grandfather   . Kidney disease Neg Hx   . Prostate cancer Neg Hx   . Kidney cancer Neg Hx   . Bladder Cancer Neg Hx   .  Colon cancer Neg Hx     Allergies: Patient has no known allergies. Current Outpatient Medications on File Prior to Visit  Medication Sig Dispense Refill  . amLODipine (NORVASC) 10 MG tablet Take 10 mg by mouth daily.    Marland Kitchen atorvastatin (LIPITOR) 40 MG tablet TAKE 1 TABLET BY MOUTH EVERY DAY 90 tablet 1  . atorvastatin (LIPITOR) 40 MG tablet Take 1 tablet (40 mg total) by mouth at bedtime. 90 tablet 1  . cetirizine (ZYRTEC) 10 MG tablet Take 1 tablet (10 mg total) by mouth daily. 30 tablet 5  . clomiPHENE (CLOMID) 50 MG tablet Take 1 tablet (50 mg total) by mouth daily. 30 tablet 3  . clotrimazole (LOTRIMIN) 1 % cream Apply 1 application topically 2 (two) times daily. To penis 60 g 0  . fluticasone (FLONASE) 50 MCG/ACT nasal spray Place into both nostrils daily.    . Insulin Glargine (BASAGLAR KWIKPEN) 100 UNIT/ML SOPN Inject 0.1 mLs (10 Units total) into the skin every morning. With food 5 pen 1  . Insulin Pen Needle (NOVOFINE) 30G X 8 MM MISC Use to inject insulin and Victoza each 1x per day E11.9 180 each 3  . omeprazole (PRILOSEC) 20 MG capsule TAKE 2  CAPSULES BY MOUTH EVERY DAY 30 capsule 2  . pioglitazone (ACTOS) 30 MG tablet Take 1 tablet (30 mg total) by mouth daily. 90 tablet 0  . tadalafil (CIALIS) 20 MG tablet Take 1 tablet (20 mg total) by mouth daily as needed for erectile dysfunction. 6 tablet 11   No current facility-administered medications on file prior to visit.     Social History   Tobacco Use  . Smoking status: Current Every Day Smoker    Packs/day: 1.00    Types: Cigarettes  . Smokeless tobacco: Never Used  Substance Use Topics  . Alcohol use: No    Alcohol/week: 0.0 oz  . Drug use: No    Review of Systems  Constitutional: Negative for chills and fever.  HENT: Positive for congestion and sinus pressure. Negative for ear pain and sore throat.   Respiratory: Negative for cough, shortness of breath and wheezing.   Cardiovascular: Negative for chest pain and  palpitations.  Gastrointestinal: Negative for nausea and vomiting.  Endocrine: Negative for polyuria.  Genitourinary: Negative for discharge (resolved) and dysuria.      Objective:    BP 132/90 (BP Location: Left Arm, Patient Position: Sitting, Cuff Size: Large)   Pulse 84   Temp 98.6 F (37 C) (Oral)   Resp 16   Wt 238 lb 2 oz (108 kg)   SpO2 97%   BMI 32.30 kg/m  BP Readings from Last 3 Encounters:  03/25/17 132/90  03/11/17 136/88  01/14/17 (!) 142/87   Wt Readings from Last 3 Encounters:  03/25/17 238 lb 2 oz (108 kg)  03/11/17 237 lb 3.2 oz (107.6 kg)  01/14/17 240 lb 8 oz (109.1 kg)    Physical Exam  Constitutional: Vital signs are normal. He appears well-developed and well-nourished.  HENT:  Head: Normocephalic and atraumatic.  Right Ear: Hearing, tympanic membrane, external ear and ear canal normal. No drainage, swelling or tenderness. Tympanic membrane is not injected, not erythematous and not bulging. No middle ear effusion. No decreased hearing is noted.  Left Ear: Hearing, tympanic membrane, external ear and ear canal normal. No drainage, swelling or tenderness. Tympanic membrane is not injected, not erythematous and not bulging.  No middle ear effusion. No decreased hearing is noted.  Nose: Nose normal. Right sinus exhibits no maxillary sinus tenderness and no frontal sinus tenderness. Left sinus exhibits no maxillary sinus tenderness and no frontal sinus tenderness.  Mouth/Throat: Uvula is midline, oropharynx is clear and moist and mucous membranes are normal. No oropharyngeal exudate, posterior oropharyngeal edema, posterior oropharyngeal erythema or tonsillar abscesses.  No erythema, swelling.  Eyes: Conjunctivae are normal.  Cardiovascular: Regular rhythm and normal heart sounds.  Pulmonary/Chest: Effort normal and breath sounds normal. No respiratory distress. He has no wheezes. He has no rhonchi. He has no rales.  Lymphadenopathy:       Head (right side):  No submental, no submandibular, no tonsillar, no preauricular, no posterior auricular and no occipital adenopathy present.       Head (left side): No submental, no submandibular, no tonsillar, no preauricular, no posterior auricular and no occipital adenopathy present.    He has no cervical adenopathy.  Neurological: He is alert.  Skin: Skin is warm and dry.  Psychiatric: He has a normal mood and affect. His speech is normal and behavior is normal.  Vitals reviewed.      Assessment & Plan:   Problem List Items Addressed This Visit      Respiratory   Acute recurrent sinusitis  No improvement on augmentin. Agreed may be inflammation post infection. We agreed short trial of prednisone, mucinex. CT Sinus due to persistent nature of symptoms last few months. Patient will let me know how he is doing      Relevant Medications   predniSONE (DELTASONE) 10 MG tablet   Other Relevant Orders   CT Maxillofacial W/Cm     Endocrine   DM (diabetes mellitus) ([place]) - Primary    FGB in 200s - had been in 400s. Increased victoza today. Will remain on actos, victoza, glargine for now. Janumet has not been approved by insurance at this time. F.u one month      Relevant Medications   liraglutide (VICTOZA) 18 MG/3ML SOPN       I have discontinued Okey Regal. Fread Jr.'s Desmopressin Acetate, sitaGLIPtin-metformin, and amoxicillin-clavulanate. I have also changed his liraglutide. Additionally, I am having him start on predniSONE. Lastly, I am having him maintain his amLODipine, fluticasone, tadalafil, cetirizine, omeprazole, clomiPHENE, atorvastatin, clotrimazole, pioglitazone, atorvastatin, BASAGLAR KWIKPEN, and Insulin Pen Needle.   Meds ordered this encounter  Medications  . liraglutide (VICTOZA) 18 MG/3ML SOPN    Sig: Inject 0.2 mLs (1.2 mg total) into the skin daily.    Dispense:  5 pen    Refill:  1    Order Specific Question:   Supervising Provider    Answer:   Derrel Nip, TERESA L [[id number]]  .  predniSONE (DELTASONE) 10 MG tablet    Sig: Take 40 mg by mouth on day 1, then taper 10 mg daily until gone    Dispense:  10 tablet    Refill:  0    Order Specific Question:   Supervising Provider    Answer:   Crecencio Mc [[id number]]    Return precautions given.   Risks, benefits, and alternatives of the medications and treatment plan prescribed today were discussed, and patient expressed understanding.   Education regarding symptom management and diagnosis given to patient on AVS.  Continue to follow with Burnard Hawthorne, FNP for routine health maintenance.   Ronald Karvonen. and I agreed with plan.   Mable Paris, FNP

## 2017-03-25 NOTE — Patient Instructions (Addendum)
Start prednisone; may also trial mucinx to see if helps with sinus pressure- LOTS of water  CT sinus ordered  Please let me know how you are doing  Increase vicotza to 1.8mg  [place].   F/u one month, sooner if blood sugars not coming down

## 2017-03-26 ENCOUNTER — Telehealth: Payer: Self-pay | Admitting: Family

## 2017-03-26 NOTE — Telephone Encounter (Signed)
Please advise 

## 2017-03-26 NOTE — Telephone Encounter (Unsigned)
Copied from [place] [phone]. Topic: Referral - Question >> Mar 26, 2017  1:52 PM Hewitt Shorts wrote: CRM for notification. See Telephone encounter for: [place] [facility] is calling Lorriane Shire in regards to radiology authorization [id number] this needs to go thru [employer] and they are cancelling the one from [place]  03/26/17.

## 2017-03-27 NOTE — Telephone Encounter (Signed)
I have called [employer] the authorization has been approved.

## 2017-03-30 ENCOUNTER — Other Ambulatory Visit: Payer: Self-pay | Admitting: Internal Medicine

## 2017-04-01 ENCOUNTER — Other Ambulatory Visit: Payer: Self-pay | Admitting: Family

## 2017-04-01 ENCOUNTER — Encounter: Payer: Self-pay | Admitting: Family

## 2017-04-01 DIAGNOSIS — J329 Chronic sinusitis, unspecified: Secondary | ICD-10-CM

## 2017-04-10 ENCOUNTER — Ambulatory Visit
Admission: RE | Admit: 2017-04-10 | Discharge: 2017-04-10 | Disposition: A | Discharge status: Home / Self Care | Payer: 59 | Source: Ambulatory Visit | Attending: Family | Admitting: Family

## 2017-04-10 ENCOUNTER — Encounter: Payer: Self-pay | Admitting: Family

## 2017-04-10 DIAGNOSIS — J342 Deviated nasal septum: Secondary | ICD-10-CM | POA: Diagnosis not present

## 2017-04-10 DIAGNOSIS — J341 Cyst and mucocele of nose and nasal sinus: Secondary | ICD-10-CM | POA: Diagnosis not present

## 2017-04-10 DIAGNOSIS — J0191 Acute recurrent sinusitis, unspecified: Secondary | ICD-10-CM | POA: Insufficient documentation

## 2017-04-13 ENCOUNTER — Other Ambulatory Visit: Payer: Self-pay | Admitting: Family

## 2017-04-13 DIAGNOSIS — J0191 Acute recurrent sinusitis, unspecified: Secondary | ICD-10-CM

## 2017-04-13 MED ORDER — DOXYCYCLINE HYCLATE 100 MG PO TABS: 100.0000 mg | ORAL_TABLET | Freq: Two times a day (BID) | ORAL | 0 refills | Status: DC

## 2017-04-15 ENCOUNTER — Other Ambulatory Visit: Payer: 59

## 2017-04-15 DIAGNOSIS — E291 Testicular hypofunction: Secondary | ICD-10-CM

## 2017-04-16 LAB — HEMATOCRIT: Hematocrit: 46.8 % (ref 37.5–51.0)

## 2017-04-16 LAB — HEPATIC FUNCTION PANEL
ALT: 45 IU/L — ABNORMAL HIGH (ref 0–44)
AST: 52 IU/L — ABNORMAL HIGH (ref 0–40)
Albumin: 4 g/dL (ref 3.5–5.5)
Alkaline Phosphatase: 69 IU/L (ref 39–117)
BILIRUBIN TOTAL: 0.4 mg/dL (ref 0.0–1.2)
Bilirubin, Direct: 0.15 mg/dL (ref 0.00–0.40)
TOTAL PROTEIN: 6.7 g/dL (ref 6.0–8.5)

## 2017-04-16 LAB — PSA: PROSTATE SPECIFIC AG, SERUM: 0.2 ng/mL (ref 0.0–4.0)

## 2017-04-16 LAB — TESTOSTERONE: TESTOSTERONE: 550 ng/dL (ref 264–916)

## 2017-04-16 LAB — HEMOGLOBIN: HEMOGLOBIN: 15.6 g/dL (ref 13.0–17.7)

## 2017-04-20 ENCOUNTER — Other Ambulatory Visit: Payer: 59

## 2017-04-21 NOTE — Progress Notes (Signed)
04/22/2017 9:39 AM   Ronald Pruitt. May 21, 1970 [id number]  Referring provider: Burnard Hawthorne, FNP [address] [place], [place] [address]  Chief Complaint  Patient presents with  . Hypogonadism    HPI: Patient is a 47 year old Caucasian male with testosterone deficiency, ED and BPH with LU TS who presents today for 6 month follow-up.      Testosterone deficiency He is no longer having spontaneous erections at night.  He does have sleep apnea and is sleeping with his CPAP machine.  He is reporting a loss of body hair, reduced beard growth, obesity and type 2 diabetes mellitus.  His current testosterone level is 550 ng/dL on 04/15/2017.   He is currently managing his hypogonadism with Clomid 50 mg, 1 tablet daily.  His HCT and Hbg are normal.   His LFT's are elevated.    Erectile dysfunction His SHIM score is 11, which is moderate ED.   His previous SHIM score was 14.  He has been having difficulty with erections for several years.   His major complaint is lack of firmness.  His libido is diminished   His risk factors for ED are age, BPH, testosterone deficiency, DM, HTN, HLD, sleep apnea, stress and night shift work.  He denies any painful erections or curvatures with his erections.   He is still having/no longer having spontaneous erections.  He is taking Cialis and finding it effective.  He is having to take a tablet and 1/2 to get an effect.   SHIM    Row Name 04/22/17 0841         SHIM: Over the last 6 months:   How do you rate your confidence that you could get and keep an erection?  Very Low     When you had erections with sexual stimulation, how often were your erections hard enough for penetration (entering your partner)?  A Few Times (much less than half the time)     During sexual intercourse, how often were you able to maintain your erection after you had penetrated (entered) your partner?  A Few Times (much less than half the time)     During sexual  intercourse, how difficult was it to maintain your erection to completion of intercourse?  Difficult     When you attempted sexual intercourse, how often was it satisfactory for you?  Sometimes (about half the time)       SHIM Total Score   SHIM  11        Score: 1-7 Severe ED 8-11 Moderate ED 12-16 Mild-Moderate ED 17-21 Mild ED 22-25 No ED  BPH WITH LUTS His IPSS score today is 12, which is moderate lower urinary tract symptomatology. He is mixed with his quality life due to his urinary symptoms.   His previous IPSS score was 15/4.  His previous PVR was 42 mL.  He has stopped the jardiance and his urinary symptoms have improved.  He has been sleeping with his CPAP machine.  His last HbgA1c was 11.7 % in 02/2017.  He denies any dysuria, hematuria or suprapubic pain.  He also denies any recent fevers, chills, nausea or vomiting.  He does not have a family history of PCa. IPSS    Row Name 04/22/17 0800         International Prostate Symptom Score   How often have you had the sensation of not emptying your bladder?  Less than half the time  How often have you had to urinate less than every two hours?  About half the time     How often have you found you stopped and started again several times when you urinated?  Less than 1 in 5 times     How often have you found it difficult to postpone urination?  Less than half the time     How often have you had a weak urinary stream?  Less than 1 in 5 times     How often have you had to strain to start urination?  Less than 1 in 5 times     How many times did you typically get up at night to urinate?  2 Times     Total IPSS Score  12       Quality of Life due to urinary symptoms   If you were to spend the rest of your life with your urinary condition just the way it is now how would you feel about that?  Mixed        Score:  1-7 Mild 8-19 Moderate 20-35 Severe  Nephrolithiasis Incidental finding of a 13 mm left renal stone on 2017 CT.   KUB today (04/22/2017) noted a coarse approximately 10 x 12 mm calcification projecting over the upper pole of the left kidney may reflect a parenchymal calcification or a stone.    PMH: Past Medical History:  Diagnosis Date  . Arthritis   . BPH (benign prostatic hyperplasia)   . Diabetes ([place])   . Dysuria   . GERD (gastroesophageal reflux disease)   . HTN (hypertension)   . Hyperlipidemia   . Impotence   . Nocturia   . Obesity   . Sleep apnea   . Testicular hypofunction     Surgical History: Past Surgical History:  Procedure Laterality Date  . CHOLECYSTECTOMY    . VASECTOMY      Home Medications:  Allergies as of 04/22/2017   No Known Allergies     Medication List        Accurate as of 04/22/17  9:39 AM. Always use your most recent med list.          amLODipine 10 MG tablet Commonly known as:  NORVASC Take 10 mg by mouth daily.   atorvastatin 40 MG tablet Commonly known as:  LIPITOR TAKE 1 TABLET BY MOUTH EVERY DAY   atorvastatin 40 MG tablet Commonly known as:  LIPITOR Take 1 tablet (40 mg total) by mouth at bedtime.   BASAGLAR KWIKPEN 100 UNIT/ML Sopn Inject 0.1 mLs (10 Units total) into the skin every morning. With food   cetirizine 10 MG tablet Commonly known as:  ZYRTEC Take 1 tablet (10 mg total) by mouth daily.   clomiPHENE 50 MG tablet Commonly known as:  CLOMID Take 1 tablet (50 mg total) by mouth daily.   clotrimazole 1 % cream Commonly known as:  LOTRIMIN Apply 1 application topically 2 (two) times daily. To penis   fluticasone 50 MCG/ACT nasal spray Commonly known as:  FLONASE Place into both nostrils daily.   Insulin Pen Needle 30G X 8 MM Misc Commonly known as:  NOVOFINE Use to inject insulin and Victoza each 1x per day E11.9   liraglutide 18 MG/3ML Sopn Commonly known as:  VICTOZA Inject 0.2 mLs (1.2 mg total) into the skin daily.   omeprazole 20 MG capsule Commonly known as:  PRILOSEC TAKE 2 CAPSULES BY MOUTH EVERY  DAY   pioglitazone 30 MG tablet  Commonly known as:  ACTOS Take 1 tablet (30 mg total) by mouth daily.   tadalafil 20 MG tablet Commonly known as:  CIALIS Take 1 tablet (20 mg total) by mouth daily as needed for erectile dysfunction.   Testosterone Enanthate 75 MG/0.5ML Soaj Commonly known as:  XYOSTED Inject 1 pen into the skin every 7 (seven) days.   vardenafil 20 MG tablet Commonly known as:  LEVITRA Take 1 tablet (20 mg total) by mouth daily as needed for erectile dysfunction.       Allergies: No Known Allergies  Family History: Family History  Problem Relation Age of Onset  . Hyperlipidemia Mother   . Diabetes Mellitus II Mother   . Seizures Mother   . Diabetes Mother   . Mental illness Mother   . Arthritis Mother   . Diabetes Father   . Cirrhosis Father        non alcoholic  . Hyperlipidemia Father   . Arthritis Father   . Liver cancer Father   . Diabetes Maternal Grandmother   . Hyperlipidemia Maternal Grandmother   . Arthritis Maternal Grandmother   . Diabetes Maternal Grandfather   . Hyperlipidemia Maternal Grandfather   . Arthritis Maternal Grandfather   . Diabetes Paternal Grandmother   . Hyperlipidemia Paternal Grandmother   . Arthritis Paternal Grandmother   . Diabetes Paternal Grandfather   . Hyperlipidemia Paternal Grandfather   . Arthritis Paternal Grandfather   . Kidney disease Neg Hx   . Prostate cancer Neg Hx   . Kidney cancer Neg Hx   . Bladder Cancer Neg Hx   . Colon cancer Neg Hx     Social History:  reports that he has been smoking cigarettes.  He has been smoking about 1.00 pack per day. He has never used smokeless tobacco. He reports that he does not drink alcohol or use drugs.  ROS: UROLOGY Frequent Urination?: No Hard to postpone urination?: No Burning/pain with urination?: No Get up at night to urinate?: No Leakage of urine?: No Urine stream starts and stops?: No Trouble starting stream?: No Do you have to strain to  urinate?: No Blood in urine?: No Urinary tract infection?: No Sexually transmitted disease?: No Injury to kidneys or bladder?: No Painful intercourse?: No Weak stream?: No Erection problems?: Yes Penile pain?: No  Gastrointestinal Nausea?: No Vomiting?: No Indigestion/heartburn?: No Diarrhea?: No Constipation?: No  Constitutional Fever: No Night sweats?: No Weight loss?: No Fatigue?: Yes  Skin Skin rash/lesions?: No Itching?: No  Eyes Blurred vision?: No Double vision?: No  Ears/Nose/Throat Sore throat?: No Sinus problems?: Yes  Hematologic/Lymphatic Swollen glands?: No Easy bruising?: No  Cardiovascular Leg swelling?: No Chest pain?: No  Respiratory Cough?: No Shortness of breath?: No  Endocrine Excessive thirst?: No  Musculoskeletal Back pain?: No Joint pain?: No  Neurological Headaches?: No Dizziness?: No  Psychologic Depression?: No Anxiety?: No  Physical Exam: BP (!) 141/91   Pulse 88   Ht 6' (1.829 m)   Wt 240 lb (108.9 kg)   BMI 32.55 kg/m   Constitutional: Well nourished. Alert and oriented, No acute distress. HEENT: [place] AT, moist mucus membranes. Trachea midline, no masses. Cardiovascular: No clubbing, cyanosis, or edema. Respiratory: Normal respiratory effort, no increased work of breathing. GI: Abdomen is soft, non tender, non distended, no abdominal masses. Liver and spleen not palpable.  No hernias appreciated.  Stool sample for occult testing is not indicated.   GU: No CVA tenderness.  No bladder fullness or masses.  Patient with  circumcised phallus.  Urethral meatus is patent.  No penile discharge. No penile lesions or rashes. Scrotum without lesions, cysts, rashes and/or edema.  Testicles are located scrotally bilaterally. No masses are appreciated in the testicles. Left and right epididymis are normal. Rectal: Patient with  normal sphincter tone. Anus and perineum without scarring or rashes. No rectal masses are appreciated.  Prostate is approximately 45 grams, no nodules are appreciated. Seminal vesicles are normal. Skin: No rashes, bruises or suspicious lesions. Lymph: No cervical or inguinal adenopathy. Neurologic: Grossly intact, no focal deficits, moving all 4 extremities. Psychiatric: Normal mood and affect.    Laboratory Data: PSA History  0.3 ng/mL on 04/00/2017  0.3 ng/mL on 04/21/2016  0.3 ng/mL on 10/15/2016  0.2 ng/mL on 04/15/2017 Lab Results  Component Value Date   WBC 6.5 03/11/2017   HGB 15.6 04/15/2017   HCT 46.8 04/15/2017   MCV 90.7 03/11/2017   PLT 171.0 03/11/2017    Lab Results  Component Value Date   CREATININE 0.98 03/11/2017    Lab Results  Component Value Date   AST 52 (H) 04/15/2017   Lab Results  Component Value Date   ALT 45 (H) 04/15/2017   I have reviewed the labs  Pertinent Imaging CLINICAL DATA:  Follow-up kidney stones.  No current complaints.  EXAM: ABDOMEN - 1 VIEW  COMPARISON:  None in PACs  FINDINGS: There is a coarse calcification that projects over the upper pole of the left kidney. This may lie within the cortex or within the renal collecting system. No other left-sided kidney stones are observed. No right-sided kidney stones are observed either. No ureteral or bladder stones are demonstrated. The bowel gas pattern is normal. There surgical clips in the gallbladder fossa. The bony structures are unremarkable.  IMPRESSION: Coarse approximately 10 x 12 mm calcification projecting over the upper pole of the left kidney may reflect a parenchymal calcification or a stone.   Electronically Signed   By: David  Martinique M.D.   On: 04/22/2017 16:44 I have independently reviewed the films.   Assessment & Plan:    1. Testosterone deficiency   -most recent testosterone level is 550ng/dL on 04/15/2017  (goal 450-600 ng/dL)  -will need to discontinue Clomid at this time due to elevated LFT's  -will have a trial of Xyosted 75 mg  weekly  -RTC in 1 months for testosterone level  2. BPH with LUTS  - IPSS score is 12/3, it is improving  - Continue conservative management, avoiding bladder irritants and timed voiding's  - RTC in 6 months for IPSS, PSA, PVR and exam, as testosterone therapy can cause prostate enlargement and worsen LUTS  3. Erectile dysfunction:     -SHIM score is 11, it is worsening  -continue Cialis 20 mg - will give a script for Levitra to see if that is more effective  -RTC in 6 months for SHIM score and exam, as testosterone therapy can affect erections  4. Nocturia  - he is sleeping with his CPAP machine  5. Left renal stone KUB noted a stable 10 x 12 mm left upper pole stone Patient does not desire intervention at this time Recommend yearly KUB's  Return in about 1 month (around 05/23/2017) for testosterone level only.  These notes generated with voice recognition software. I apologize for typographical errors.  Zara Council, [facility] [address] [place], [place] [address] [phone]

## 2017-04-22 ENCOUNTER — Ambulatory Visit
Admission: RE | Admit: 2017-04-22 | Discharge: 2017-04-22 | Disposition: A | Discharge status: Home / Self Care | Payer: 59 | Source: Ambulatory Visit | Attending: Urology | Admitting: Urology

## 2017-04-22 ENCOUNTER — Encounter: Payer: Self-pay | Admitting: Urology

## 2017-04-22 ENCOUNTER — Ambulatory Visit (INDEPENDENT_AMBULATORY_CARE_PROVIDER_SITE_OTHER): Payer: 59 | Admitting: Urology

## 2017-04-22 ENCOUNTER — Ambulatory Visit (INDEPENDENT_AMBULATORY_CARE_PROVIDER_SITE_OTHER): Payer: 59 | Admitting: Family

## 2017-04-22 ENCOUNTER — Encounter: Payer: Self-pay | Admitting: Family

## 2017-04-22 VITALS — BP 150/84 | HR 80 | Temp 98.6°F | Resp 15 | Wt 238.5 lb

## 2017-04-22 VITALS — BP 141/91 | HR 88 | Ht 72.0 in | Wt 240.0 lb

## 2017-04-22 DIAGNOSIS — N200 Calculus of kidney: Secondary | ICD-10-CM | POA: Diagnosis not present

## 2017-04-22 DIAGNOSIS — Z794 Long term (current) use of insulin: Secondary | ICD-10-CM | POA: Diagnosis not present

## 2017-04-22 DIAGNOSIS — E118 Type 2 diabetes mellitus with unspecified complications: Secondary | ICD-10-CM | POA: Diagnosis not present

## 2017-04-22 DIAGNOSIS — N138 Other obstructive and reflux uropathy: Secondary | ICD-10-CM | POA: Diagnosis not present

## 2017-04-22 DIAGNOSIS — N401 Enlarged prostate with lower urinary tract symptoms: Secondary | ICD-10-CM

## 2017-04-22 DIAGNOSIS — I10 Essential (primary) hypertension: Secondary | ICD-10-CM | POA: Diagnosis not present

## 2017-04-22 DIAGNOSIS — Z23 Encounter for immunization: Secondary | ICD-10-CM | POA: Diagnosis not present

## 2017-04-22 DIAGNOSIS — K760 Fatty (change of) liver, not elsewhere classified: Secondary | ICD-10-CM

## 2017-04-22 DIAGNOSIS — J0191 Acute recurrent sinusitis, unspecified: Secondary | ICD-10-CM | POA: Diagnosis not present

## 2017-04-22 DIAGNOSIS — E349 Endocrine disorder, unspecified: Secondary | ICD-10-CM | POA: Diagnosis not present

## 2017-04-22 DIAGNOSIS — N529 Male erectile dysfunction, unspecified: Secondary | ICD-10-CM

## 2017-04-22 MED ORDER — TESTOSTERONE ENANTHATE 75 MG/0.5ML ~~LOC~~ SOAJ: 1.0000 "pen " | SUBCUTANEOUS | 5 refills | Status: DC

## 2017-04-22 MED ORDER — VARDENAFIL HCL 20 MG PO TABS: 20.0000 mg | ORAL_TABLET | Freq: Every day | ORAL | 6 refills | Status: DC | PRN

## 2017-04-22 MED ORDER — LISINOPRIL 5 MG PO TABS: 5.0000 mg | ORAL_TABLET | Freq: Every day | ORAL | 3 refills | Status: DC

## 2017-04-22 MED ORDER — TADALAFIL 20 MG PO TABS: 20.0000 mg | ORAL_TABLET | Freq: Every day | ORAL | 11 refills | Status: DC | PRN

## 2017-04-22 NOTE — Assessment & Plan Note (Signed)
Unchanged. Pending ent consult

## 2017-04-22 NOTE — Assessment & Plan Note (Signed)
Elevated today.  Long discussion with patient in regards to his diabetes and I would advise starting an ACE inhibitor for renal protection and also to lower his blood pressure.  Low-dose lisinopril started today.  Patient will monitor at home.  Repeat BMP in 5 days.

## 2017-04-22 NOTE — Patient Instructions (Addendum)
Increase basaglar 1 unit every 6 days until fasting blood sugars are less than 120 units. You are close so please take it SLOW and make sure no low sugar episodes.   Start lisinopril for blood pressure and for kidney protection  Please bring by dates of Hep A and Hep B vaccines.   Labs next week to check electrolytes since starting lisinopril-  Please make an appointment. When you come though, please ask them to WAIT on a1c as you are not due for that until April 19 or after.

## 2017-04-22 NOTE — Assessment & Plan Note (Signed)
Uncontrolled.  Fasting glucose not at goal.  Advised 1 unit increase in basaglar every 5-6 days until fasting blood sugars approach 120.  Advised patient to be very cautious, monitor for hypoglycemic episodes which he has had none as of today.

## 2017-04-22 NOTE — Progress Notes (Signed)
Subjective:    Patient ID: Ronald Pruitt., male    DOB: Nov 29, 1970, 47 y.o.   MRN: [id number]  CC: [name]. is a 47 y.o. male who presents today for follow up.   HPI: DM- not on janumet. Compliant with medications. FBG 150. On 10 units on basaglar qam. No hypoglycemic episodes.    HTN- at urology this morning 144/90. Denies exertional chest pain or pressure, numbness or tingling radiating to left arm or jaw, palpitations, dizziness, frequent headaches, changes in vision, or shortness of breath.    Recurrent sinusitis- symptoms unchanged.  pending an appt with ENT.   NAFLD- 12/2016 saw Dr Allen Norris; reports he had had hep a and hep b vaccines at work.    HISTORY:  Past Medical History:  Diagnosis Date  . Arthritis   . BPH (benign prostatic hyperplasia)   . Diabetes ([place])   . Dysuria   . GERD (gastroesophageal reflux disease)   . HTN (hypertension)   . Hyperlipidemia   . Impotence   . Nocturia   . Obesity   . Sleep apnea   . Testicular hypofunction    Past Surgical History:  Procedure Laterality Date  . CHOLECYSTECTOMY    . VASECTOMY     Family History  Problem Relation Age of Onset  . Hyperlipidemia Mother   . Diabetes Mellitus II Mother   . Seizures Mother   . Diabetes Mother   . Mental illness Mother   . Arthritis Mother   . Diabetes Father   . Cirrhosis Father        non alcoholic  . Hyperlipidemia Father   . Arthritis Father   . Liver cancer Father   . Diabetes Maternal Grandmother   . Hyperlipidemia Maternal Grandmother   . Arthritis Maternal Grandmother   . Diabetes Maternal Grandfather   . Hyperlipidemia Maternal Grandfather   . Arthritis Maternal Grandfather   . Diabetes Paternal Grandmother   . Hyperlipidemia Paternal Grandmother   . Arthritis Paternal Grandmother   . Diabetes Paternal Grandfather   . Hyperlipidemia Paternal Grandfather   . Arthritis Paternal Grandfather   . Kidney disease Neg Hx   . Prostate cancer Neg Hx   .  Kidney cancer Neg Hx   . Bladder Cancer Neg Hx   . Colon cancer Neg Hx     Allergies: Patient has no known allergies. Current Outpatient Medications on File Prior to Visit  Medication Sig Dispense Refill  . amLODipine (NORVASC) 10 MG tablet Take 10 mg by mouth daily.    Marland Kitchen atorvastatin (LIPITOR) 40 MG tablet TAKE 1 TABLET BY MOUTH EVERY DAY 90 tablet 1  . atorvastatin (LIPITOR) 40 MG tablet Take 1 tablet (40 mg total) by mouth at bedtime. 90 tablet 1  . cetirizine (ZYRTEC) 10 MG tablet Take 1 tablet (10 mg total) by mouth daily. 30 tablet 5  . clotrimazole (LOTRIMIN) 1 % cream Apply 1 application topically 2 (two) times daily. To penis 60 g 0  . fluticasone (FLONASE) 50 MCG/ACT nasal spray Place into both nostrils daily.    . Insulin Glargine (BASAGLAR KWIKPEN) 100 UNIT/ML SOPN Inject 0.1 mLs (10 Units total) into the skin every morning. With food 5 pen 1  . Insulin Pen Needle (NOVOFINE) 30G X 8 MM MISC Use to inject insulin and Victoza each 1x per day E11.9 180 each 3  . liraglutide (VICTOZA) 18 MG/3ML SOPN Inject 0.2 mLs (1.2 mg total) into the skin daily. 5 pen 1  .  omeprazole (PRILOSEC) 20 MG capsule TAKE 2 CAPSULES BY MOUTH EVERY DAY 30 capsule 0  . pioglitazone (ACTOS) 30 MG tablet Take 1 tablet (30 mg total) by mouth daily. 90 tablet 0  . tadalafil (CIALIS) 20 MG tablet Take 1 tablet (20 mg total) by mouth daily as needed for erectile dysfunction. 6 tablet 11  . Testosterone Enanthate (XYOSTED) 75 MG/0.5ML SOAJ Inject 1 pen into the skin every 7 (seven) days. 4 pen 5  . vardenafil (LEVITRA) 20 MG tablet Take 1 tablet (20 mg total) by mouth daily as needed for erectile dysfunction. 10 tablet 6  . clomiPHENE (CLOMID) 50 MG tablet Take 1 tablet (50 mg total) by mouth daily. (Patient not taking: Reported on 04/22/2017) 30 tablet 3   No current facility-administered medications on file prior to visit.     Social History   Tobacco Use  . Smoking status: Current Every Day Smoker     Packs/day: 1.00    Types: Cigarettes  . Smokeless tobacco: Never Used  Substance Use Topics  . Alcohol use: No    Alcohol/week: 0.0 oz  . Drug use: No    Review of Systems  Constitutional: Negative for chills and fever.  HENT: Positive for congestion. Negative for sinus pressure, sinus pain and sore throat.   Respiratory: Negative for cough and shortness of breath.   Cardiovascular: Negative for chest pain and palpitations.  Gastrointestinal: Negative for nausea and vomiting.      Objective:    BP (!) 150/84 (BP Location: Left Arm, Patient Position: Sitting, Cuff Size: Normal)   Pulse 80   Temp 98.6 F (37 C) (Oral)   Resp 15   Wt 238 lb 8 oz (108.2 kg)   SpO2 95%   BMI 32.35 kg/m  BP Readings from Last 3 Encounters:  04/22/17 (!) 150/84  04/22/17 (!) 141/91  03/25/17 132/90   Wt Readings from Last 3 Encounters:  04/22/17 238 lb 8 oz (108.2 kg)  04/22/17 240 lb (108.9 kg)  03/25/17 238 lb 2 oz (108 kg)    Physical Exam  Constitutional: He appears well-developed and well-nourished.  Cardiovascular: Regular rhythm and normal heart sounds.  Pulmonary/Chest: Effort normal and breath sounds normal. No respiratory distress. He has no wheezes. He has no rhonchi. He has no rales.  Neurological: He is alert.  Skin: Skin is warm and dry.  Psychiatric: He has a normal mood and affect. His speech is normal and behavior is normal.  Vitals reviewed.      Assessment & Plan:   Problem List Items Addressed This Visit      Cardiovascular and Mediastinum   HTN (hypertension) - Primary    Elevated today.  Long discussion with patient in regards to his diabetes and I would advise starting an ACE inhibitor for renal protection and also to lower his blood pressure.  Low-dose lisinopril started today.  Patient will monitor at home.  Repeat BMP in 5 days.      Relevant Medications   lisinopril (PRINIVIL,ZESTRIL) 5 MG tablet   Other Relevant Orders   Basic metabolic panel      Respiratory   Acute recurrent sinusitis    Unchanged. Pending ent consult        Digestive   Fatty liver disease, nonalcoholic    Patient was evaluated Dr. Allen Norris December 2018.  He states that he has had hepatitis a and B vaccine series and completed at this time.  He will drop off at our front desk when  these vaccinations were done so we can update our records.        Endocrine   DM (diabetes mellitus) ([place])    Uncontrolled.  Fasting glucose not at goal.  Advised 1 unit increase in basaglar every 5-6 days until fasting blood sugars approach 120.  Advised patient to be very cautious, monitor for hypoglycemic episodes which he has had none as of today.      Relevant Medications   lisinopril (PRINIVIL,ZESTRIL) 5 MG tablet   Other Relevant Orders   Hemoglobin A1c   HIV antibody    Other Visit Diagnoses    Need for diphtheria-tetanus-pertussis (Tdap) vaccine       Relevant Orders   Tdap vaccine greater than or equal to 7yo IM       I am having Okey Regal. Patsy [place]. start on lisinopril. I am also having him maintain his amLODipine, fluticasone, cetirizine, clomiPHENE, atorvastatin, clotrimazole, pioglitazone, atorvastatin, BASAGLAR KWIKPEN, Insulin Pen Needle, liraglutide, omeprazole, Testosterone Enanthate, vardenafil, and tadalafil.   Meds ordered this encounter  Medications  . lisinopril (PRINIVIL,ZESTRIL) 5 MG tablet    Sig: Take 1 tablet (5 mg total) by mouth daily.    Dispense:  90 tablet    Refill:  3    Order Specific Question:   Supervising Provider    Answer:   Crecencio Mc [[id number]]    Return precautions given.   Risks, benefits, and alternatives of the medications and treatment plan prescribed today were discussed, and patient expressed understanding.   Education regarding symptom management and diagnosis given to patient on AVS.  Continue to follow with Burnard Hawthorne, FNP for routine health maintenance.   Ronald Pruitt. and I agreed with plan.    Mable Paris, FNP

## 2017-04-22 NOTE — Assessment & Plan Note (Signed)
Patient was evaluated Dr. Allen Norris December 2018.  He states that he has had hepatitis a and B vaccine series and completed at this time.  He will drop off at our front desk when these vaccinations were done so we can update our records.

## 2017-04-23 ENCOUNTER — Telehealth: Payer: Self-pay

## 2017-04-23 NOTE — Telephone Encounter (Signed)
-----   Message from Nori Riis, PA-C sent at 04/22/2017  8:37 PM EDT ----- Please let Mr. Uchechukwu know that the stone is unchanged.

## 2017-04-23 NOTE — Telephone Encounter (Signed)
Patient notified on vmail 

## 2017-04-28 ENCOUNTER — Other Ambulatory Visit (INDEPENDENT_AMBULATORY_CARE_PROVIDER_SITE_OTHER): Payer: 59

## 2017-04-28 DIAGNOSIS — E118 Type 2 diabetes mellitus with unspecified complications: Secondary | ICD-10-CM | POA: Diagnosis not present

## 2017-04-28 DIAGNOSIS — I10 Essential (primary) hypertension: Secondary | ICD-10-CM

## 2017-04-28 DIAGNOSIS — Z794 Long term (current) use of insulin: Secondary | ICD-10-CM | POA: Diagnosis not present

## 2017-04-28 LAB — BASIC METABOLIC PANEL
BUN: 10 mg/dL (ref 7–25)
CALCIUM: 9 mg/dL (ref 8.6–10.3)
CO2: 26 mmol/L (ref 20–32)
Chloride: 103 mmol/L (ref 98–110)
Creat: 0.78 mg/dL (ref 0.60–1.35)
Glucose, Bld: 280 mg/dL — ABNORMAL HIGH (ref 65–99)
POTASSIUM: 4.3 mmol/L (ref 3.5–5.3)
SODIUM: 141 mmol/L (ref 135–146)

## 2017-04-28 NOTE — Addendum Note (Signed)
Addended by: Arby Barrette on: 04/28/2017 10:25 AM   Modules accepted: Orders

## 2017-04-28 NOTE — Addendum Note (Signed)
Addended by: Arby Barrette on: 04/28/2017 10:31 AM   Modules accepted: Orders

## 2017-04-29 LAB — HIV ANTIBODY (ROUTINE TESTING W REFLEX): HIV 1&2 Ab, 4th Generation: NONREACTIVE

## 2017-05-06 ENCOUNTER — Encounter: Payer: Self-pay | Admitting: Anesthesiology

## 2017-05-06 ENCOUNTER — Other Ambulatory Visit: Payer: Self-pay

## 2017-05-06 ENCOUNTER — Encounter: Payer: Self-pay | Admitting: *Deleted

## 2017-05-10 ENCOUNTER — Other Ambulatory Visit: Payer: Self-pay | Admitting: Family

## 2017-05-10 DIAGNOSIS — J302 Other seasonal allergic rhinitis: Secondary | ICD-10-CM

## 2017-05-12 ENCOUNTER — Ambulatory Visit: Admission: RE | Admit: 2017-05-12 | Payer: 59 | Source: Ambulatory Visit | Admitting: Otolaryngology

## 2017-05-12 HISTORY — DX: Presence of dental prosthetic device (complete) (partial): Z97.2

## 2017-05-12 SURGERY — SEPTOPLASTY, NOSE, WITH NASAL TURBINATE REDUCTION
Anesthesia: General | Laterality: Bilateral

## 2017-05-14 ENCOUNTER — Other Ambulatory Visit: Payer: Self-pay

## 2017-05-14 ENCOUNTER — Encounter
Admission: RE | Admit: 2017-05-14 | Discharge: 2017-05-14 | Disposition: A | Discharge status: Home / Self Care | Payer: 59 | Source: Ambulatory Visit | Attending: Otolaryngology | Admitting: Otolaryngology

## 2017-05-14 DIAGNOSIS — I10 Essential (primary) hypertension: Secondary | ICD-10-CM | POA: Diagnosis not present

## 2017-05-14 DIAGNOSIS — E118 Type 2 diabetes mellitus with unspecified complications: Secondary | ICD-10-CM | POA: Diagnosis not present

## 2017-05-14 DIAGNOSIS — Z01818 Encounter for other preprocedural examination: Secondary | ICD-10-CM | POA: Diagnosis not present

## 2017-05-14 DIAGNOSIS — E119 Type 2 diabetes mellitus without complications: Secondary | ICD-10-CM | POA: Insufficient documentation

## 2017-05-14 DIAGNOSIS — Z01810 Encounter for preprocedural cardiovascular examination: Secondary | ICD-10-CM | POA: Diagnosis not present

## 2017-05-14 HISTORY — DX: Personal history of urinary calculi: Z87.442

## 2017-05-14 HISTORY — DX: Pneumonia, unspecified organism: J18.9

## 2017-05-14 NOTE — Patient Instructions (Signed)
Your procedure is scheduled on: Wednesday, May 20, 2017 Report to Day Surgery on the 2nd floor of the [facility]. To find out your arrival time, please call [phone] between 1PM - 3PM on: Tuesday, May 19, 2017  REMEMBER: Instructions that are not followed completely may result in serious medical risk, up to and including death; or upon the discretion of your surgeon and anesthesiologist your surgery may need to be rescheduled.  Do not eat food after midnight the night before your procedure.  No gum chewing, lozengers or hard candies.  You may however, drink water up to 2 hours before you are scheduled to arrive for your surgery. Do not drink anything within 2 hours of the start of your surgery.  No Alcohol for 24 hours before or after surgery.  No Smoking including e-cigarettes for 24 hours prior to surgery.  No chewable tobacco products for at least 6 hours prior to surgery.  No nicotine patches on the day of surgery.  On the morning of surgery brush your teeth with toothpaste and water, you may rinse your mouth with mouthwash if you wish. Do not swallow any toothpaste or mouthwash.  Notify your doctor if there is any change in your medical condition (cold, fever, infection).  Do not wear jewelry, make-up, hairpins, clips or nail polish.  Do not wear lotions, powders, or perfumes. You may wear deodorant.  Do not shave 48 hours prior to surgery.   Contacts and dentures may not be worn into surgery.  Do not bring valuables to the hospital, including drivers license, insurance or credit cards.  [facility] is not responsible for any belongings or valuables.   TAKE THESE MEDICATIONS THE MORNING OF SURGERY:  1.  Amlodipine 2.  Omeprazole (take one the night before surgery and one the morning of surgery - helps to prevent nausea after surgery)  DO NOT TAKE ANY INSULIN on the morning of surgery.  NOW!  Stop Anti-inflammatories (NSAIDS) such as Advil, Aleve, Ibuprofen,  Motrin, Naproxen, Naprosyn and Aspirin based products such as Excedrin, Goodys Powder, BC Powder. (May take Tylenol or Acetaminophen if needed.)  NOW!  Stop ANY OVER THE COUNTER supplements until after surgery.  Wear comfortable clothing (specific to your surgery type) to the hospital.  Plan for stool softeners for home use.  If you are being discharged the day of surgery, you will not be allowed to drive home. You will need a responsible adult to drive you home and stay with you that night.   If you are taking public transportation, you will need to have a responsible adult with you. Please confirm with your physician that it is acceptable to use public transportation.   Please call [phone] if you have any questions about these instructions.

## 2017-05-18 ENCOUNTER — Encounter: Payer: Self-pay | Admitting: Family

## 2017-05-18 DIAGNOSIS — K219 Gastro-esophageal reflux disease without esophagitis: Secondary | ICD-10-CM

## 2017-05-18 DIAGNOSIS — I10 Essential (primary) hypertension: Secondary | ICD-10-CM

## 2017-05-18 DIAGNOSIS — E1165 Type 2 diabetes mellitus with hyperglycemia: Secondary | ICD-10-CM

## 2017-05-18 MED ORDER — OMEPRAZOLE 20 MG PO CPDR: 20.0000 mg | DELAYED_RELEASE_CAPSULE | Freq: Every day | ORAL | 0 refills | Status: DC

## 2017-05-18 MED ORDER — AMLODIPINE BESYLATE 10 MG PO TABS: 10.0000 mg | ORAL_TABLET | Freq: Every day | ORAL | 1 refills | Status: DC

## 2017-05-18 NOTE — Telephone Encounter (Signed)
Doesn't look like we've prescribed before, historical med

## 2017-05-19 ENCOUNTER — Other Ambulatory Visit: Payer: Self-pay | Admitting: Internal Medicine

## 2017-05-20 ENCOUNTER — Encounter: Payer: Self-pay | Admitting: Anesthesiology

## 2017-05-20 ENCOUNTER — Ambulatory Visit: Payer: 59 | Admitting: Anesthesiology

## 2017-05-20 ENCOUNTER — Ambulatory Visit
Admission: RE | Admit: 2017-05-20 | Discharge: 2017-05-20 | Disposition: A | Discharge status: Home / Self Care | Payer: 59 | Source: Ambulatory Visit | Attending: Otolaryngology | Admitting: Otolaryngology

## 2017-05-20 ENCOUNTER — Encounter
Admission: RE | Disposition: A | Discharge status: Home / Self Care | Payer: Self-pay | Source: Ambulatory Visit | Attending: Otolaryngology

## 2017-05-20 ENCOUNTER — Other Ambulatory Visit: Payer: Self-pay | Admitting: Family

## 2017-05-20 ENCOUNTER — Other Ambulatory Visit: Payer: Self-pay

## 2017-05-20 DIAGNOSIS — J342 Deviated nasal septum: Secondary | ICD-10-CM | POA: Diagnosis present

## 2017-05-20 DIAGNOSIS — Z8249 Family history of ischemic heart disease and other diseases of the circulatory system: Secondary | ICD-10-CM | POA: Insufficient documentation

## 2017-05-20 DIAGNOSIS — E669 Obesity, unspecified: Secondary | ICD-10-CM | POA: Insufficient documentation

## 2017-05-20 DIAGNOSIS — I10 Essential (primary) hypertension: Secondary | ICD-10-CM | POA: Insufficient documentation

## 2017-05-20 DIAGNOSIS — Z6832 Body mass index (BMI) 32.0-32.9, adult: Secondary | ICD-10-CM | POA: Diagnosis not present

## 2017-05-20 DIAGNOSIS — J3489 Other specified disorders of nose and nasal sinuses: Secondary | ICD-10-CM | POA: Insufficient documentation

## 2017-05-20 DIAGNOSIS — K219 Gastro-esophageal reflux disease without esophagitis: Secondary | ICD-10-CM | POA: Diagnosis not present

## 2017-05-20 DIAGNOSIS — J343 Hypertrophy of nasal turbinates: Secondary | ICD-10-CM | POA: Insufficient documentation

## 2017-05-20 DIAGNOSIS — Z79899 Other long term (current) drug therapy: Secondary | ICD-10-CM | POA: Diagnosis not present

## 2017-05-20 DIAGNOSIS — F17200 Nicotine dependence, unspecified, uncomplicated: Secondary | ICD-10-CM | POA: Insufficient documentation

## 2017-05-20 DIAGNOSIS — G4730 Sleep apnea, unspecified: Secondary | ICD-10-CM | POA: Insufficient documentation

## 2017-05-20 DIAGNOSIS — E118 Type 2 diabetes mellitus with unspecified complications: Secondary | ICD-10-CM | POA: Diagnosis not present

## 2017-05-20 DIAGNOSIS — M1990 Unspecified osteoarthritis, unspecified site: Secondary | ICD-10-CM | POA: Diagnosis not present

## 2017-05-20 HISTORY — PX: NASAL SEPTOPLASTY W/ TURBINOPLASTY: SHX2070

## 2017-05-20 LAB — GLUCOSE, CAPILLARY
Glucose-Capillary: 191 mg/dL — ABNORMAL HIGH (ref 65–99)
Glucose-Capillary: 156 mg/dL — ABNORMAL HIGH (ref 65–99)

## 2017-05-20 SURGERY — SEPTOPLASTY, NOSE, WITH NASAL TURBINATE REDUCTION
Anesthesia: General

## 2017-05-20 MED ORDER — FENTANYL CITRATE (PF) 100 MCG/2ML IJ SOLN
INTRAMUSCULAR | Status: DC | PRN
  Administered 2017-05-20 (×2): 50 ug via INTRAVENOUS

## 2017-05-20 MED ORDER — OXYMETAZOLINE HCL 0.05 % NA SOLN
NASAL | Status: AC
  Filled 2017-05-20: qty 15

## 2017-05-20 MED ORDER — PHENYLEPHRINE HCL 10 MG/ML IJ SOLN
INTRAMUSCULAR | Status: DC | PRN
  Administered 2017-05-20 (×3): 100 ug via INTRAVENOUS

## 2017-05-20 MED ORDER — SUCCINYLCHOLINE CHLORIDE 20 MG/ML IJ SOLN
INTRAMUSCULAR | Status: AC
  Filled 2017-05-20: qty 1

## 2017-05-20 MED ORDER — MIDAZOLAM HCL 2 MG/2ML IJ SOLN
INTRAMUSCULAR | Status: DC | PRN
  Administered 2017-05-20: 2 mg via INTRAVENOUS

## 2017-05-20 MED ORDER — SUGAMMADEX SODIUM 200 MG/2ML IV SOLN
INTRAVENOUS | Status: AC
  Filled 2017-05-20: qty 2

## 2017-05-20 MED ORDER — FENTANYL CITRATE (PF) 100 MCG/2ML IJ SOLN
25.0000 ug | INTRAMUSCULAR | Status: DC | PRN
  Administered 2017-05-20: 25 ug via INTRAVENOUS

## 2017-05-20 MED ORDER — DEXAMETHASONE SODIUM PHOSPHATE 10 MG/ML IJ SOLN
INTRAMUSCULAR | Status: DC | PRN
  Administered 2017-05-20: 10 mg via INTRAVENOUS

## 2017-05-20 MED ORDER — SUGAMMADEX SODIUM 200 MG/2ML IV SOLN
INTRAVENOUS | Status: DC | PRN
  Administered 2017-05-20: 200 mg via INTRAVENOUS

## 2017-05-20 MED ORDER — LIDOCAINE HCL (CARDIAC) PF 100 MG/5ML IV SOSY
PREFILLED_SYRINGE | INTRAVENOUS | Status: DC | PRN
  Administered 2017-05-20: 100 mg via INTRAVENOUS

## 2017-05-20 MED ORDER — DEXAMETHASONE SODIUM PHOSPHATE 10 MG/ML IJ SOLN
INTRAMUSCULAR | Status: AC
  Filled 2017-05-20: qty 1

## 2017-05-20 MED ORDER — ONDANSETRON HCL 4 MG/2ML IJ SOLN: 4.0000 mg | Freq: Once | INTRAMUSCULAR | Status: DC | PRN

## 2017-05-20 MED ORDER — FENTANYL CITRATE (PF) 100 MCG/2ML IJ SOLN
INTRAMUSCULAR | Status: AC
  Filled 2017-05-20: qty 2

## 2017-05-20 MED ORDER — LIDOCAINE HCL (PF) 2 % IJ SOLN
INTRAMUSCULAR | Status: AC
  Filled 2017-05-20: qty 10

## 2017-05-20 MED ORDER — CEPHALEXIN 500 MG PO CAPS: 500.0000 mg | ORAL_CAPSULE | Freq: Two times a day (BID) | ORAL | 0 refills | Status: DC

## 2017-05-20 MED ORDER — BACITRACIN ZINC 500 UNIT/GM EX OINT
TOPICAL_OINTMENT | CUTANEOUS | Status: AC
  Filled 2017-05-20: qty 28.35

## 2017-05-20 MED ORDER — PROPOFOL 10 MG/ML IV BOLUS
INTRAVENOUS | Status: AC
  Filled 2017-05-20: qty 20

## 2017-05-20 MED ORDER — SODIUM CHLORIDE 0.9 % IV SOLN
INTRAVENOUS | Status: DC | PRN
  Administered 2017-05-20: 08:00:00 via INTRAVENOUS

## 2017-05-20 MED ORDER — HYDROCODONE-ACETAMINOPHEN 5-325 MG PO TABS: 1.0000 | ORAL_TABLET | ORAL | 0 refills | Status: DC | PRN

## 2017-05-20 MED ORDER — LIDOCAINE-EPINEPHRINE (PF) 1 %-1:200000 IJ SOLN
INTRAMUSCULAR | Status: DC | PRN
  Administered 2017-05-20: 9 mL

## 2017-05-20 MED ORDER — PROPOFOL 10 MG/ML IV BOLUS
INTRAVENOUS | Status: DC | PRN
  Administered 2017-05-20: 150 mg via INTRAVENOUS

## 2017-05-20 MED ORDER — ROCURONIUM BROMIDE 100 MG/10ML IV SOLN
INTRAVENOUS | Status: DC | PRN
  Administered 2017-05-20: 5 mg via INTRAVENOUS
  Administered 2017-05-20: 10 mg via INTRAVENOUS
  Administered 2017-05-20: 30 mg via INTRAVENOUS

## 2017-05-20 MED ORDER — LIDOCAINE-EPINEPHRINE (PF) 1 %-1:200000 IJ SOLN
INTRAMUSCULAR | Status: AC
  Filled 2017-05-20: qty 30

## 2017-05-20 MED ORDER — ONDANSETRON HCL 4 MG/2ML IJ SOLN
INTRAMUSCULAR | Status: DC | PRN
  Administered 2017-05-20: 4 mg via INTRAVENOUS

## 2017-05-20 MED ORDER — SUCCINYLCHOLINE CHLORIDE 20 MG/ML IJ SOLN
INTRAMUSCULAR | Status: DC | PRN
  Administered 2017-05-20: 100 mg via INTRAVENOUS

## 2017-05-20 MED ORDER — MIDAZOLAM HCL 2 MG/2ML IJ SOLN
INTRAMUSCULAR | Status: AC
  Filled 2017-05-20: qty 2

## 2017-05-20 MED ORDER — ONDANSETRON HCL 4 MG/2ML IJ SOLN
INTRAMUSCULAR | Status: AC
  Filled 2017-05-20: qty 2

## 2017-05-20 MED ORDER — ACETAMINOPHEN 10 MG/ML IV SOLN
INTRAVENOUS | Status: DC | PRN
  Administered 2017-05-20: 1000 mg via INTRAVENOUS

## 2017-05-20 MED ORDER — ROCURONIUM BROMIDE 50 MG/5ML IV SOLN
INTRAVENOUS | Status: AC
  Filled 2017-05-20: qty 1

## 2017-05-20 MED ORDER — ACETAMINOPHEN 10 MG/ML IV SOLN
INTRAVENOUS | Status: AC
  Filled 2017-05-20: qty 100

## 2017-05-20 MED ORDER — OXYMETAZOLINE HCL 0.05 % NA SOLN
NASAL | Status: DC | PRN
  Administered 2017-05-20: 1

## 2017-05-20 SURGICAL SUPPLY — 18 items
CANISTER SUCT 1200ML W/VALVE (MISCELLANEOUS) ×2 IMPLANT
COAG SUCT 10F 3.5MM HAND CTRL (MISCELLANEOUS) ×2 IMPLANT
ELECT REM PT RETURN 9FT ADLT (ELECTROSURGICAL) ×2
ELECTRODE REM PT RTRN 9FT ADLT (ELECTROSURGICAL) ×1 IMPLANT
GLOVE BIO SURGEON STRL SZ7.5 (GLOVE) ×2 IMPLANT
GOWN STRL REUS W/ TWL LRG LVL3 (GOWN DISPOSABLE) ×2 IMPLANT
GOWN STRL REUS W/TWL LRG LVL3 (GOWN DISPOSABLE) ×2
KIT TURNOVER KIT A (KITS) ×2 IMPLANT
NS IRRIG 500ML POUR BTL (IV SOLUTION) ×2 IMPLANT
PACK HEAD/NECK (MISCELLANEOUS) ×2 IMPLANT
SPLINT NASAL SEPTAL PRE-CUT (MISCELLANEOUS) ×2 IMPLANT
SPONGE NEURO XRAY DETECT 1X3 (DISPOSABLE) ×2 IMPLANT
SUT CHROMIC 4 0 RB 1X27 (SUTURE) ×2 IMPLANT
SUT ETHILON 4-0 (SUTURE) ×1
SUT ETHILON 4-0 FS2 18XMFL BLK (SUTURE) ×1
SUT PLAIN GUT 4-0 (SUTURE) IMPLANT
SUTURE ETHLN 4-0 FS2 18XMF BLK (SUTURE) ×1 IMPLANT
SYR 3ML LL SCALE MARK (SYRINGE) ×2 IMPLANT

## 2017-05-20 NOTE — Op Note (Signed)
05/20/2017  8:55 AM    Ronald Pruitt  [id number]   Pre-Op Diagnosis:  Nasal obstruction with septal deviation and inferior turbinate hypertrophy  Post-op Diagnosis: Same  Procedure: 1) Nasal Septoplasty, 2) Bilateral submucous resection of the inferior turbinates  Surgeon:  Riley Nearing  Anesthesia:  General endotracheal  EBL:  54OE  Complications:  None  Findings: right septal deviation. Hypertrophy of the inferior turbinates  Procedure: The patient was taken to the Operating Room and placed in the supine position.  After induction of general endotracheal anesthesia, the table was turned 90 degrees. A time-out was issued to confirm the site and procedure. The nasal septum and inferior turbinates where then injected with 1% lidocaine with epiniephrine, 1:200,000. The nose was decongested with Afrin soaked pledgets. The patient was then prepped and draped in the usual sterile fashion. Beginning on the left hand side a hemitransfixion incision was then created on the leading edge of the septum on the left.  A subperichondrial plane was elevated posteriorly on the left and taken back to the perpendicular plate of the ethmoid where subperiosteal plane was elevated posteriorly on the left. A large septal spur was identified on the right hand side.  An inferior rim of redundant septal cartilage was removed from where it deviated over the maxillary crest. The perpendicular plate of the ethmoid was separated from the quadrangular cartilage, and a subperiosteal plane elevated on the right of the bony septum. The large septal spur was removed, dividing the septal bone superiorly with Knight scissors, and inferiorly from the maxillary crest with a chisel. A portion of the cartilage in the mid-septum was excised to further straighten the septum, preserving a generous strut of dorsal and caudal cartilage.  The septum was then replaced in the midline. Reinspection through each nostril showed  excellent reduction of the septal deformity. A left posterior inferior fenestration was then created to allow hematoma drainage.  The septal incision was closed with 4-0 chromic gut suture. A 4-0 plain gut suture was used to reapproximate the septal flaps to the underlying cartilage, utilizing a running, quilting type stitch.   With the septoplasty completed, beginning on the left-hand side, a 15 blade was used to incise along the inferior edge of the inferior turbinate. A superior laterally based flap of the medial turbinate mucosa was then elevated. A portion of the underlying conchal bone and lateral mucosa was excised using Knight scissors. The flap was then laid back over the turbinate stump and the bleeding edge of the turbinate cauterized using suction cautery. In a similar fashion the submucous resection was performed on the right.  With the submucous resection completed bilaterally and no active bleeding, nasal septal splints were placed within each nostril and affixed to the septum using a 3-0 nylon suture.     The patient was then returned to the anesthesiologist for awakening, and was taken to the Recovery Room in stable condition.  Disposition:   PACU to home  Plan: Soft, bland diet and push fluids. Take pain medications and antibiotics as prescribed. No strenuous activity for 2 weeks. Follow-up in 3 weeks.  Riley Nearing 05/20/2017 8:55 AM

## 2017-05-20 NOTE — Anesthesia Post-op Follow-up Note (Signed)
Anesthesia QCDR form completed.        

## 2017-05-20 NOTE — Anesthesia Postprocedure Evaluation (Signed)
Anesthesia Post Note  Patient: Ronald Pruitt.  Procedure(s) Performed: NASAL SEPTOPLASTY WITH SUBMUCOUS RESECTION (N/A )  Patient location during evaluation: PACU Anesthesia Type: General Level of consciousness: awake and alert Pain management: pain level controlled Vital Signs Assessment: post-procedure vital signs reviewed and stable Respiratory status: spontaneous breathing, nonlabored ventilation, respiratory function stable and patient connected to nasal cannula oxygen Cardiovascular status: blood pressure returned to baseline and stable Postop Assessment: no apparent nausea or vomiting Anesthetic complications: no     Last Vitals:  Vitals:   05/20/17 0945 05/20/17 0953  BP: (!) 133/94 (!) 131/94  Pulse: 77 76  Resp: 14 16  Temp: 36.4 C (!) 35.8 C  SpO2: 96% 93%    Last Pain:  Vitals:   05/20/17 0953  TempSrc: Temporal  PainSc: [place]

## 2017-05-20 NOTE — Anesthesia Procedure Notes (Signed)
Procedure Name: Intubation Date/Time: 05/20/2017 7:42 AM Performed by: Terrence Dupont, CRNA Pre-anesthesia Checklist: Patient identified, Emergency Drugs available, Suction available, Patient being monitored and Timeout performed Patient Re-evaluated:Patient Re-evaluated prior to induction Oxygen Delivery Method: Circle system utilized Preoxygenation: Pre-oxygenation with 100% oxygen Induction Type: IV induction Ventilation: Mask ventilation without difficulty Laryngoscope Size: Mac and 4 Grade View: Grade I Tube type: Oral Tube size: 7.5 mm Number of attempts: 1 Airway Equipment and Method: Stylet Placement Confirmation: ETT inserted through vocal cords under direct vision,  positive ETCO2 and breath sounds checked- equal and bilateral Secured at: 22 cm Tube secured with: Tape Dental Injury: Teeth and Oropharynx as per pre-operative assessment

## 2017-05-20 NOTE — H&P (Signed)
History and physical reviewed and will be scanned in later. No change in medical status reported by the patient or family, appears stable for surgery. All questions regarding the procedure answered, and patient (or family if a child) expressed understanding of the procedure. ? ?Ronald Pruitt [name] ?@TODAY@ ?

## 2017-05-20 NOTE — Progress Notes (Signed)
No further bleeding noted from nares.

## 2017-05-20 NOTE — Progress Notes (Signed)
IV infused 100cc in post op

## 2017-05-20 NOTE — Progress Notes (Signed)
close

## 2017-05-20 NOTE — Progress Notes (Signed)
SLIGHT NASAL BLEEDING WHEN SAT UPRIGHT    NASAL DRESSING APPLIED

## 2017-05-20 NOTE — Discharge Instructions (Signed)
AMBULATORY SURGERY  °DISCHARGE INSTRUCTIONS ° ° °1) The drugs that you were given will stay in your system until tomorrow so for the next 24 hours you should not: ° °A) Drive an automobile °B) Make any legal decisions °C) Drink any alcoholic beverage ° ° °2) You may resume regular meals tomorrow.  Today it is better to start with liquids and gradually work up to solid foods. ° °You may eat anything you prefer, but it is better to start with liquids, then soup and crackers, and gradually work up to solid foods. ° ° °3) Please notify your doctor immediately if you have any unusual bleeding, trouble breathing, redness and pain at the surgery site, drainage, fever, or pain not relieved by medication. ° ° ° °4) Additional Instructions: ° ° ° ° ° ° ° °Please contact your physician with any problems or Same Day Surgery at [phone], Monday through Friday 6 am to 4 pm, or [place] at [place] Main number at [phone].AMBULATORY SURGERY  °DISCHARGE INSTRUCTIONS ° ° °5) The drugs that you were given will stay in your system until tomorrow so for the next 24 hours you should not: ° °D) Drive an automobile °E) Make any legal decisions °F) Drink any alcoholic beverage ° ° °6) You may resume regular meals tomorrow.  Today it is better to start with liquids and gradually work up to solid foods. ° °You may eat anything you prefer, but it is better to start with liquids, then soup and crackers, and gradually work up to solid foods. ° ° °7) Please notify your doctor immediately if you have any unusual bleeding, trouble breathing, redness and pain at the surgery site, drainage, fever, or pain not relieved by medication. ° ° ° °8) Additional Instructions: ° ° ° ° ° ° ° °Please contact your physician with any problems or Same Day Surgery at [phone], Monday through Friday 6 am to 4 pm, or [facility] [place] Main number at [phone]. °

## 2017-05-20 NOTE — Transfer of Care (Signed)
Immediate Anesthesia Transfer of Care Note  Patient: Ronald Pruitt.  Procedure(s) Performed: NASAL SEPTOPLASTY WITH SUBMUCOUS RESECTION (N/A )  Patient Location: PACU  Anesthesia Type:General  Level of Consciousness: awake  Airway & Oxygen Therapy: Patient Spontanous Breathing and Patient connected to face mask oxygen  Post-op Assessment: Report given to RN and Post -op Vital signs reviewed and stable  Post vital signs: Reviewed and stable  Last Vitals:  Vitals Value Taken Time  BP 125/90 05/20/2017  9:07 AM  Temp 36.4 C 05/20/2017  9:07 AM  Pulse 80 05/20/2017  9:09 AM  Resp 22 05/20/2017  9:09 AM  SpO2 100 % 05/20/2017  9:09 AM  Vitals shown include unvalidated device data.  Last Pain:  Vitals:   05/20/17 0615  TempSrc: Oral         Complications: No apparent anesthesia complications

## 2017-05-20 NOTE — Anesthesia Preprocedure Evaluation (Signed)
Anesthesia Evaluation  Patient identified by MRN, date of birth, ID band Patient awake    Reviewed: Allergy & Precautions, NPO status , Patient's Chart, lab work & pertinent test results, reviewed documented beta blocker date and time   Airway Mallampati: III  TM Distance: >3 FB     Dental  (+) Chipped   Pulmonary sleep apnea , pneumonia, Current Smoker,           Cardiovascular hypertension, Pt. on medications      Neuro/Psych    GI/Hepatic GERD  Controlled,  Endo/Other  diabetes, Type 2  Renal/GU      Musculoskeletal  (+) Arthritis ,   Abdominal   Peds  Hematology   Anesthesia Other Findings Obese.  Reproductive/Obstetrics                             Anesthesia Physical Anesthesia Plan  ASA: III  Anesthesia Plan: General   Post-op Pain Management:    Induction: Intravenous  PONV Risk Score and Plan:   Airway Management Planned: Oral ETT  Additional Equipment:   Intra-op Plan:   Post-operative Plan:   Informed Consent: I have reviewed the patients History and Physical, chart, labs and discussed the procedure including the risks, benefits and alternatives for the proposed anesthesia with the patient or authorized representative who has indicated his/her understanding and acceptance.     Plan Discussed with: CRNA  Anesthesia Plan Comments:         Anesthesia Quick Evaluation

## 2017-05-21 ENCOUNTER — Other Ambulatory Visit: Payer: 59

## 2017-05-21 ENCOUNTER — Encounter: Payer: Self-pay | Admitting: Gastroenterology

## 2017-05-21 DIAGNOSIS — E291 Testicular hypofunction: Secondary | ICD-10-CM

## 2017-05-22 LAB — TESTOSTERONE: Testosterone: 575 ng/dL (ref 264–916)

## 2017-05-25 ENCOUNTER — Encounter: Payer: Self-pay | Admitting: Urology

## 2017-05-25 ENCOUNTER — Telehealth: Payer: Self-pay

## 2017-05-25 NOTE — Telephone Encounter (Signed)
-----   Message from Nori Riis, PA-C sent at 05/22/2017 10:53 AM EDT ----- Please let Ronald Pruitt know that his testosterone is at a therapeutic level.  He is using the [place], correct?  If so, he should continue at present dose and I will need to see him in 6 months for office visit with PSA, testosterone, hbg and HCT to be drawn before appointment.

## 2017-05-25 NOTE — Telephone Encounter (Signed)
he should continue at present dose and I will need to see him in 6 months for office visit with PSA, testosterone, hbg and HCT to be drawn before appointment.  Please schedule, patient notified on mychart thanks

## 2017-06-01 ENCOUNTER — Other Ambulatory Visit: Payer: Self-pay | Admitting: Family Medicine

## 2017-06-01 DIAGNOSIS — E349 Endocrine disorder, unspecified: Secondary | ICD-10-CM

## 2017-06-01 MED ORDER — TESTOSTERONE CYPIONATE 200 MG/ML IM SOLN: 200.0000 mg | INTRAMUSCULAR | 0 refills | Status: DC

## 2017-06-09 ENCOUNTER — Ambulatory Visit (INDEPENDENT_AMBULATORY_CARE_PROVIDER_SITE_OTHER): Payer: 59

## 2017-06-09 VITALS — BP 105/74 | HR 102 | Resp 16 | Ht 72.0 in | Wt 237.8 lb

## 2017-06-09 DIAGNOSIS — E349 Endocrine disorder, unspecified: Secondary | ICD-10-CM

## 2017-06-09 MED ORDER — TESTOSTERONE CYPIONATE 200 MG/ML IM SOLN
200.0000 mg | Freq: Once | INTRAMUSCULAR | Status: AC
  Administered 2017-06-09: 200 mg via INTRAMUSCULAR

## 2017-06-22 ENCOUNTER — Encounter: Payer: Self-pay | Admitting: Family

## 2017-06-22 DIAGNOSIS — J302 Other seasonal allergic rhinitis: Secondary | ICD-10-CM

## 2017-06-23 ENCOUNTER — Ambulatory Visit (INDEPENDENT_AMBULATORY_CARE_PROVIDER_SITE_OTHER): Payer: 59

## 2017-06-23 DIAGNOSIS — E349 Endocrine disorder, unspecified: Secondary | ICD-10-CM | POA: Diagnosis not present

## 2017-06-23 DIAGNOSIS — E291 Testicular hypofunction: Secondary | ICD-10-CM | POA: Diagnosis not present

## 2017-06-23 MED ORDER — TESTOSTERONE CYPIONATE 200 MG/ML IM SOLN
200.0000 mg | Freq: Once | INTRAMUSCULAR | Status: AC
  Administered 2017-06-23: 200 mg via INTRAMUSCULAR

## 2017-06-23 NOTE — Progress Notes (Signed)
Testosterone IM Injection  Due to Hypogonadism patient is present today for a Testosterone Injection.  Medication: Testosterone Cypionate Dose: 51ml Location: left upper outer buttocks Lot: [id number] Exp:02/2018  Patient tolerated well, no complications were noted  Preformed by: Cristie Hem, CMA  Follow up: As scheduled

## 2017-06-23 NOTE — Telephone Encounter (Signed)
Mychart message sent.

## 2017-06-23 NOTE — Addendum Note (Signed)
Addended by: Garnette Gunner on: 06/23/2017 11:42 AM   Modules accepted: Level of Service

## 2017-06-23 NOTE — Addendum Note (Signed)
Addended by: Garnette Gunner on: 06/23/2017 11:40 AM   Modules accepted: Orders

## 2017-07-01 ENCOUNTER — Other Ambulatory Visit: Payer: 59

## 2017-07-01 DIAGNOSIS — E118 Type 2 diabetes mellitus with unspecified complications: Secondary | ICD-10-CM

## 2017-07-01 DIAGNOSIS — E349 Endocrine disorder, unspecified: Secondary | ICD-10-CM

## 2017-07-01 DIAGNOSIS — Z794 Long term (current) use of insulin: Secondary | ICD-10-CM

## 2017-07-02 LAB — HEMOGLOBIN: Hemoglobin: 13.6 g/dL (ref 13.0–17.7)

## 2017-07-02 LAB — HEMATOCRIT: HEMATOCRIT: 43.3 % (ref 37.5–51.0)

## 2017-07-02 LAB — TESTOSTERONE: TESTOSTERONE: 893 ng/dL (ref 264–916)

## 2017-07-07 ENCOUNTER — Ambulatory Visit (INDEPENDENT_AMBULATORY_CARE_PROVIDER_SITE_OTHER): Payer: 59

## 2017-07-07 DIAGNOSIS — E291 Testicular hypofunction: Secondary | ICD-10-CM

## 2017-07-07 MED ORDER — TESTOSTERONE CYPIONATE 200 MG/ML IM SOLN
200.0000 mg | Freq: Once | INTRAMUSCULAR | Status: AC
  Administered 2017-07-07: 200 mg via INTRAMUSCULAR

## 2017-07-07 NOTE — Progress Notes (Signed)
Testosterone IM Injection  Due to Hypogonadism patient is present today for a Testosterone Injection.  Medication: Testosterone Cypionate Dose: 15ml Location: right upper outer buttocks Lot: [id number] Exp:01/2019  Patient tolerated well, no complications were noted  Preformed by: Ambrose Finland  Follow up: As scheduled

## 2017-07-21 ENCOUNTER — Ambulatory Visit (INDEPENDENT_AMBULATORY_CARE_PROVIDER_SITE_OTHER): Payer: 59 | Admitting: Family Medicine

## 2017-07-21 DIAGNOSIS — E291 Testicular hypofunction: Secondary | ICD-10-CM | POA: Diagnosis not present

## 2017-07-21 MED ORDER — TESTOSTERONE CYPIONATE 200 MG/ML IM SOLN
200.0000 mg | Freq: Once | INTRAMUSCULAR | Status: AC
  Administered 2017-07-21: 200 mg via INTRAMUSCULAR

## 2017-07-21 NOTE — Progress Notes (Signed)
Testosterone IM Injection  Due to Hypogonadism patient is present today for a Testosterone Injection.  Medication: Testosterone Cypionate Dose: 1ML Location: left upper outer buttocks Lot: [id number] Exp:01/2019  Patient tolerated well, no complications were noted  Preformed by: Elberta Leatherwood, CMA  Follow up: 2 weeks

## 2017-07-28 ENCOUNTER — Encounter: Payer: Self-pay | Admitting: Family Medicine

## 2017-07-28 ENCOUNTER — Ambulatory Visit (INDEPENDENT_AMBULATORY_CARE_PROVIDER_SITE_OTHER): Payer: 59 | Admitting: Family Medicine

## 2017-07-28 VITALS — BP 128/92 | HR 97 | Temp 98.5°F | Wt 240.2 lb

## 2017-07-28 DIAGNOSIS — L0291 Cutaneous abscess, unspecified: Secondary | ICD-10-CM

## 2017-07-28 MED ORDER — DOXYCYCLINE HYCLATE 100 MG PO TABS: 100.0000 mg | ORAL_TABLET | Freq: Two times a day (BID) | ORAL | 0 refills | Status: DC

## 2017-07-28 NOTE — Patient Instructions (Signed)
Please take medication as directed and avoid direct sunlight while on this medication.  If no improvement, or increasing redness, swelling, warmth, fever, or red streaks, seek medical attention immediately.   Skin Abscess A skin abscess is an infected area on or under your skin that contains pus and other material. An abscess can happen almost anywhere on your body. Some abscesses break open (rupture) on their own. Most continue to get worse unless they are treated. The infection can spread deeper into the body and into your blood, which can make you feel sick. Treatment usually involves draining the abscess. Follow these instructions at home: Abscess Care  If you have an abscess that has not drained, place a warm, clean, wet washcloth over the abscess several times a day. Do this as told by your doctor.  Follow instructions from your doctor about how to take care of your abscess. Make sure you: ? Cover the abscess with a bandage (dressing). ? Change your bandage or gauze as told by your doctor. ? Wash your hands with soap and water before you change the bandage or gauze. If you cannot use soap and water, use hand sanitizer.  Check your abscess every day for signs that the infection is getting worse. Check for: ? More redness, swelling, or pain. ? More fluid or blood. ? Warmth. ? More pus or a bad smell. Medicines   Take over-the-counter and prescription medicines only as told by your doctor.  If you were prescribed an antibiotic medicine, take it as told by your doctor. Do not stop taking the antibiotic even if you start to feel better. General instructions  To avoid spreading the infection: ? Do not share personal care items, towels, or hot tubs with others. ? Avoid making skin-to-skin contact with other people.  Keep all follow-up visits as told by your doctor. This is important. Contact a doctor if:  You have more redness, swelling, or pain around your abscess.  You have  more fluid or blood coming from your abscess.  Your abscess feels warm when you touch it.  You have more pus or a bad smell coming from your abscess.  You have a fever.  Your muscles ache.  You have chills.  You feel sick. Get help right away if:  You have very bad (severe) pain.  You see red streaks on your skin spreading away from the abscess. This information is not intended to replace advice given to you by your health care provider. Make sure you discuss any questions you have with your health care provider. Document Released: 07/02/2007 Document Revised: 09/09/2015 Document Reviewed: 11/22/2014 Elsevier Interactive Patient Education  [employer].

## 2017-07-28 NOTE — Progress Notes (Signed)
Subjective:    Patient ID: Ronald Karvonen., male    DOB: Oct 02, 1970, 47 y.o.   MRN: [id number]  HPI  Mr. Ronald Pruitt is a 3 old male who presents today for a "knot beside of left ear" that has been present for 2 months. He reports that this area has grown over the past two weeks. Associated tenderness present. He denies fever, chills, sweats, drainage, N/V, or abdominal pain. No treatments have been tried at this time. No history of injury, trauma, or insect bite. History of HTN, DM and is taking insulin, NAFLD  Review of Systems  Constitutional: Negative for chills, fatigue and fever.  HENT: Negative for congestion, ear discharge, ear pain, facial swelling, rhinorrhea, sinus pressure, sinus pain and sore throat.   Respiratory: Negative for cough, shortness of breath and wheezing.   Cardiovascular: Negative for chest pain and palpitations.  Gastrointestinal: Negative for abdominal pain, diarrhea, nausea and vomiting.  Musculoskeletal: Negative for myalgias.  Skin: Negative for rash.       Knot in front of left ear  Neurological: Negative for dizziness, weakness and headaches.   Past Medical History:  Diagnosis Date  . Arthritis   . BPH (benign prostatic hyperplasia)   . Diabetes ([place])   . Dysuria   . GERD (gastroesophageal reflux disease)   . History of kidney stones   . HTN (hypertension)   . Hyperlipidemia   . Impotence   . Nocturia   . Obesity   . Pneumonia age 54's  . Sleep apnea    CPAP - severe (sleep study 01/11/16)  . Testicular hypofunction   . Wears dentures    full upper and lower     Social History   Socioeconomic History  . Marital status: Significant Other    Spouse name: Not on file  . Number of children: Not on file  . Years of education: Not on file  . Highest education level: Not on file  Occupational History  . Not on file  Social Needs  . Financial resource strain: Not on file  . Food insecurity:    Worry: Not on file    Inability: Not  on file  . Transportation needs:    Medical: Not on file    Non-medical: Not on file  Tobacco Use  . Smoking status: Current Every Day Smoker    Packs/day: 0.50    Years: 20.00    Pack years: 10.00    Types: Cigarettes  . Smokeless tobacco: Never Used  Substance and Sexual Activity  . Alcohol use: No    Alcohol/week: 0.0 oz  . Drug use: No  . Sexual activity: Not on file  Lifestyle  . Physical activity:    Days per week: Not on file    Minutes per session: Not on file  . Stress: Not on file  Relationships  . Social connections:    Talks on phone: Not on file    Gets together: Not on file    Attends religious service: Not on file    Active member of club or organization: Not on file    Attends meetings of clubs or organizations: Not on file    Relationship status: Not on file  . Intimate partner violence:    Fear of current or ex partner: Not on file    Emotionally abused: Not on file    Physically abused: Not on file    Forced sexual activity: Not on file  Other Topics Concern  .  Not on file  Social History Narrative   [name]- part of merger   Has girlfriend    Past Surgical History:  Procedure Laterality Date  . CHOLECYSTECTOMY    . NASAL SEPTOPLASTY W/ TURBINOPLASTY N/A 05/20/2017   Procedure: NASAL SEPTOPLASTY WITH SUBMUCOUS RESECTION;  Surgeon: Clyde Canterbury, MD;  Location: [facility] ORS;  Service: ENT;  Laterality: N/A;  . VASECTOMY      Family History  Problem Relation Age of Onset  . Hyperlipidemia Mother   . Diabetes Mellitus II Mother   . Seizures Mother   . Diabetes Mother   . Mental illness Mother   . Arthritis Mother   . Diabetes Father   . Cirrhosis Father        non alcoholic  . Hyperlipidemia Father   . Arthritis Father   . Liver cancer Father   . Diabetes Maternal Grandmother   . Hyperlipidemia Maternal Grandmother   . Arthritis Maternal Grandmother   . Diabetes Maternal Grandfather   . Hyperlipidemia Maternal Grandfather   . Arthritis  Maternal Grandfather   . Diabetes Paternal Grandmother   . Hyperlipidemia Paternal Grandmother   . Arthritis Paternal Grandmother   . Diabetes Paternal Grandfather   . Hyperlipidemia Paternal Grandfather   . Arthritis Paternal Grandfather   . Kidney disease Neg Hx   . Prostate cancer Neg Hx   . Kidney cancer Neg Hx   . Bladder Cancer Neg Hx   . Colon cancer Neg Hx     No Known Allergies  Current Outpatient Medications on File Prior to Visit  Medication Sig Dispense Refill  . amLODipine (NORVASC) 10 MG tablet Take 1 tablet (10 mg total) by mouth daily. 90 tablet 1  . atorvastatin (LIPITOR) 40 MG tablet TAKE 1 TABLET BY MOUTH EVERY DAY 90 tablet 1  . cetirizine (ZYRTEC) 10 MG tablet TAKE 1 TABLET BY MOUTH EVERY DAY 30 tablet 5  . HYDROcodone-acetaminophen (NORCO/VICODIN) 5-325 MG tablet Take 1-2 tablets by mouth every 4 (four) hours as needed for moderate pain. 40 tablet 0  . Insulin Glargine (BASAGLAR KWIKPEN) 100 UNIT/ML SOPN Inject 0.1 mLs (10 Units total) into the skin every morning. With food (Patient taking differently: Inject 20 Units into the skin every morning. With food) 5 pen 1  . Insulin Pen Needle (NOVOFINE) 30G X 8 MM MISC Use to inject insulin and Victoza each 1x per day E11.9 180 each 3  . liraglutide (VICTOZA) 18 MG/3ML SOPN Inject 0.2 mLs (1.2 mg total) into the skin daily. 5 pen 1  . omeprazole (PRILOSEC) 20 MG capsule Take 1 capsule (20 mg total) by mouth daily. 90 capsule 0  . testosterone cypionate (DEPOTESTOSTERONE CYPIONATE) 200 MG/ML injection Inject 1 mL (200 mg total) into the muscle every 14 (fourteen) days. 10 mL 0  . vardenafil (LEVITRA) 20 MG tablet Take 1 tablet (20 mg total) by mouth daily as needed for erectile dysfunction. 10 tablet 6   No current facility-administered medications on file prior to visit.     BP (!) 128/92 (BP Location: Left Arm, Patient Position: Sitting, Cuff Size: Large)   Pulse 97   Temp 98.5 F (36.9 C) (Oral)   Wt 240 lb 4 oz  (109 kg)   SpO2 96%   BMI 32.58 kg/m       Objective:   Physical Exam  Constitutional: He is oriented to person, place, and time. He appears well-developed and well-nourished.  Eyes: Pupils are equal, round, and reactive to light. No scleral  icterus.  Neck: Neck supple.  Cardiovascular: Normal rate, regular rhythm and intact distal pulses.  Pulmonary/Chest: Effort normal and breath sounds normal. He has no wheezes. He has no rales.  Abdominal: Soft.  Lymphadenopathy:    He has no cervical adenopathy.  Neurological: He is alert and oriented to person, place, and time.  Skin: Skin is warm and dry. No rash noted.  Circumscribed, erythematous, raised nodule approximately 1.5 cm in diameter with punctate present on left side of cheek in area of beard close to ear. Spontaneous yellow/blood tinged drainage with palpation. Obtained culture for further evaluation.       Assessment & Plan:  1. Abscess Spontaneous draining with palpation, culture obtained. History of DM present; will initiate antibiotic therapy today. Reviewed the importance of close follow up and monitoring for improvement. Further discussed if symptoms worsen such as increasing erythema, edema, pain, or red streaks, he should seek immediate medical attention. Warm compresses advised. Follow up in 1 to 2 weeks for evaluation Return precautions provided.   - doxycycline (VIBRA-TABS) 100 MG tablet; Take 1 tablet (100 mg total) by mouth 2 (two) times daily.  Dispense: 20 tablet; Refill: 0  Delano Metz, FNP-C

## 2017-08-01 LAB — WOUND CULTURE
MICRO NUMBER: 90787504
SPECIMEN QUALITY:: ADEQUATE

## 2017-08-04 ENCOUNTER — Ambulatory Visit (INDEPENDENT_AMBULATORY_CARE_PROVIDER_SITE_OTHER): Payer: 59

## 2017-08-04 DIAGNOSIS — E291 Testicular hypofunction: Secondary | ICD-10-CM

## 2017-08-04 MED ORDER — TESTOSTERONE CYPIONATE 200 MG/ML IM SOLN
200.0000 mg | Freq: Once | INTRAMUSCULAR | Status: AC
  Administered 2017-08-04: 200 mg via INTRAMUSCULAR

## 2017-08-04 NOTE — Progress Notes (Signed)
Testosterone IM Injection  Due to Hypogonadism patient is present today for a Testosterone Injection.  Medication: Testosterone Cypionate Dose: 76ml Location: right upper outer buttocks Lot: [id number] Exp:03/2019  Patient tolerated well, no complications were noted  Preformed by: Fonnie Jarvis, CMA  Follow up: 2 wk

## 2017-08-12 ENCOUNTER — Telehealth: Payer: Self-pay | Admitting: Family

## 2017-08-12 NOTE — Telephone Encounter (Signed)
FYI

## 2017-08-12 NOTE — Telephone Encounter (Signed)
Noted on labs results

## 2017-08-12 NOTE — Telephone Encounter (Signed)
Pt returned call and results given to him regarding his wound culture and recommendation. Pt voiced understanding. He stated that he did finish the antibiotics (doxcycline) and his ear is much better. There is only a little knot there and not having any problems with it. No result note found.

## 2017-08-13 ENCOUNTER — Other Ambulatory Visit: Payer: Self-pay | Admitting: Family

## 2017-08-13 DIAGNOSIS — K219 Gastro-esophageal reflux disease without esophagitis: Secondary | ICD-10-CM

## 2017-08-16 ENCOUNTER — Other Ambulatory Visit: Payer: Self-pay | Admitting: Family

## 2017-08-16 DIAGNOSIS — K219 Gastro-esophageal reflux disease without esophagitis: Secondary | ICD-10-CM

## 2017-08-18 ENCOUNTER — Encounter: Payer: Self-pay | Admitting: Urology

## 2017-08-18 ENCOUNTER — Ambulatory Visit: Payer: 59

## 2017-08-19 ENCOUNTER — Other Ambulatory Visit: Payer: Self-pay | Admitting: Family

## 2017-08-19 DIAGNOSIS — K219 Gastro-esophageal reflux disease without esophagitis: Secondary | ICD-10-CM

## 2017-08-28 ENCOUNTER — Other Ambulatory Visit: Payer: Self-pay | Admitting: Family

## 2017-08-28 DIAGNOSIS — K219 Gastro-esophageal reflux disease without esophagitis: Secondary | ICD-10-CM

## 2017-09-01 ENCOUNTER — Ambulatory Visit (INDEPENDENT_AMBULATORY_CARE_PROVIDER_SITE_OTHER): Payer: 59

## 2017-09-01 ENCOUNTER — Other Ambulatory Visit: Payer: Self-pay | Admitting: Family

## 2017-09-01 DIAGNOSIS — E291 Testicular hypofunction: Secondary | ICD-10-CM

## 2017-09-01 DIAGNOSIS — K219 Gastro-esophageal reflux disease without esophagitis: Secondary | ICD-10-CM

## 2017-09-01 MED ORDER — TESTOSTERONE CYPIONATE 200 MG/ML IM SOLN
200.0000 mg | Freq: Once | INTRAMUSCULAR | Status: AC
  Administered 2017-09-01: 200 mg via INTRAMUSCULAR

## 2017-09-01 NOTE — Progress Notes (Signed)
Testosterone IM Injection  Due to Hypogonadism patient is present today for a Testosterone Injection.  Medication: Testosterone Cypionate Dose: 19ml Location: left upper outer buttocks Lot: [id number] Exp:03/2019  Patient tolerated well, no complications were noted  Preformed by: Cristie Hem, CMA  Follow up: As Scheduled

## 2017-09-02 ENCOUNTER — Encounter: Payer: Self-pay | Admitting: Family

## 2017-09-02 ENCOUNTER — Telehealth: Payer: Self-pay | Admitting: Family

## 2017-09-02 DIAGNOSIS — E1165 Type 2 diabetes mellitus with hyperglycemia: Secondary | ICD-10-CM

## 2017-09-02 NOTE — Telephone Encounter (Signed)
Left voicemail regarding below message.   Need to find out how many units of basaglar he is using.   Thanks.

## 2017-09-02 NOTE — Telephone Encounter (Signed)
Call pt  See where is on basaglar  Ensure NO hypoglycemic epiosodes Confirm he is also on the [place].   Due FOR A1c - please make an appt. Last a1c was 11.7 What are fasting blood sugars like?

## 2017-09-03 MED ORDER — BASAGLAR KWIKPEN 100 UNIT/ML ~~LOC~~ SOPN: 24.0000 [IU] | PEN_INJECTOR | SUBCUTANEOUS | 1 refills | Status: DC

## 2017-09-03 NOTE — Telephone Encounter (Signed)
See mychart message for documentation 

## 2017-09-10 ENCOUNTER — Telehealth: Payer: Self-pay

## 2017-09-10 NOTE — Telephone Encounter (Signed)
Patient requesting a script for Tadalafil 20mg  30days ok to fill?

## 2017-09-10 NOTE — Telephone Encounter (Signed)
That is okay to refill. 

## 2017-09-11 MED ORDER — TADALAFIL 20 MG PO TABS: 20.0000 mg | ORAL_TABLET | Freq: Every day | ORAL | 0 refills | Status: DC | PRN

## 2017-09-11 NOTE — Telephone Encounter (Signed)
RX sent patient notified.

## 2017-09-15 ENCOUNTER — Ambulatory Visit: Payer: 59

## 2017-09-16 ENCOUNTER — Ambulatory Visit (INDEPENDENT_AMBULATORY_CARE_PROVIDER_SITE_OTHER): Payer: 59

## 2017-09-16 DIAGNOSIS — E291 Testicular hypofunction: Secondary | ICD-10-CM | POA: Diagnosis not present

## 2017-09-16 MED ORDER — TESTOSTERONE CYPIONATE 200 MG/ML IM SOLN
200.0000 mg | Freq: Once | INTRAMUSCULAR | Status: AC
  Administered 2017-09-16: 200 mg via INTRAMUSCULAR

## 2017-09-16 NOTE — Progress Notes (Signed)
Testosterone IM Injection  Due to Hypogonadism patient is present today for a Testosterone Injection.  Medication: Testosterone Cypionate Dose: 78ml Location: left upper outer buttocks Lot: [id number] Exp:03/2019  Patient tolerated well, no complications were noted  Preformed by: Cristie Hem, CMA  Follow up: As Scheduled

## 2017-09-18 LAB — HM DIABETES EYE EXAM

## 2017-09-19 ENCOUNTER — Other Ambulatory Visit: Payer: Self-pay | Admitting: Family

## 2017-09-19 DIAGNOSIS — K219 Gastro-esophageal reflux disease without esophagitis: Secondary | ICD-10-CM

## 2017-09-21 ENCOUNTER — Telehealth: Payer: Self-pay | Admitting: Pharmacist

## 2017-09-21 ENCOUNTER — Encounter: Payer: Self-pay | Admitting: Family

## 2017-09-21 ENCOUNTER — Ambulatory Visit (INDEPENDENT_AMBULATORY_CARE_PROVIDER_SITE_OTHER): Payer: 59 | Admitting: Family

## 2017-09-21 VITALS — BP 130/100 | HR 80 | Temp 98.0°F | Resp 15 | Ht 72.0 in | Wt 234.1 lb

## 2017-09-21 DIAGNOSIS — K219 Gastro-esophageal reflux disease without esophagitis: Secondary | ICD-10-CM | POA: Diagnosis not present

## 2017-09-21 DIAGNOSIS — N138 Other obstructive and reflux uropathy: Secondary | ICD-10-CM

## 2017-09-21 DIAGNOSIS — E785 Hyperlipidemia, unspecified: Secondary | ICD-10-CM

## 2017-09-21 DIAGNOSIS — E118 Type 2 diabetes mellitus with unspecified complications: Secondary | ICD-10-CM

## 2017-09-21 DIAGNOSIS — N529 Male erectile dysfunction, unspecified: Secondary | ICD-10-CM

## 2017-09-21 DIAGNOSIS — Z23 Encounter for immunization: Secondary | ICD-10-CM | POA: Diagnosis not present

## 2017-09-21 DIAGNOSIS — I10 Essential (primary) hypertension: Secondary | ICD-10-CM | POA: Diagnosis not present

## 2017-09-21 DIAGNOSIS — N401 Enlarged prostate with lower urinary tract symptoms: Secondary | ICD-10-CM

## 2017-09-21 DIAGNOSIS — J302 Other seasonal allergic rhinitis: Secondary | ICD-10-CM

## 2017-09-21 MED ORDER — AMLODIPINE BESYLATE 10 MG PO TABS: 10.0000 mg | ORAL_TABLET | Freq: Every day | ORAL | 1 refills | Status: DC

## 2017-09-21 MED ORDER — OMEPRAZOLE 20 MG PO CPDR: DELAYED_RELEASE_CAPSULE | ORAL | 1 refills | Status: DC

## 2017-09-21 MED ORDER — ATORVASTATIN CALCIUM 40 MG PO TABS: 40.0000 mg | ORAL_TABLET | Freq: Every day | ORAL | 1 refills | Status: DC

## 2017-09-21 MED ORDER — CETIRIZINE HCL 10 MG PO TABS: 10.0000 mg | ORAL_TABLET | Freq: Every day | ORAL | 1 refills | Status: DC

## 2017-09-21 MED ORDER — TADALAFIL 20 MG PO TABS: 20.0000 mg | ORAL_TABLET | Freq: Every day | ORAL | 0 refills | Status: DC | PRN

## 2017-09-21 NOTE — Patient Instructions (Addendum)
I have refilled  All your medication today out of courtesy  Monitor blood pressure,  Goal is less than 130/80; if persistently higher, please make sooner follow up appointment so we can recheck you blood pressure and manage medications  Please follow up with Catie ( pharmacist) in 2 weeks.   Please have appointment with me in another month so we can check progress and get a1c.    Managing Your Hypertension Hypertension is commonly called high blood pressure. This is when the force of your blood pressing against the walls of your arteries is too strong. Arteries are blood vessels that carry blood from your heart throughout your body. Hypertension forces the heart to work harder to pump blood, and may cause the arteries to become narrow or stiff. Having untreated or uncontrolled hypertension can cause heart attack, stroke, kidney disease, and other problems. What are blood pressure readings? A blood pressure reading consists of a higher number over a lower number. Ideally, your blood pressure should be below 120/80. The first ("top") number is called the systolic pressure. It is a measure of the pressure in your arteries as your heart beats. The second ("bottom") number is called the diastolic pressure. It is a measure of the pressure in your arteries as the heart relaxes. What does my blood pressure reading mean? Blood pressure is classified into four stages. Based on your blood pressure reading, your health care provider may use the following stages to determine what type of treatment you need, if any. Systolic pressure and diastolic pressure are measured in a unit called mm Hg. Normal  Systolic pressure: below 381.  Diastolic pressure: below 80. Elevated  Systolic pressure: 017-510.  Diastolic pressure: below 80. Hypertension stage 1  Systolic pressure: 258-527.  Diastolic pressure: 78-24. Hypertension stage 2  Systolic pressure: 235 or above.  Diastolic pressure: 90 or  above. What health risks are associated with hypertension? Managing your hypertension is an important responsibility. Uncontrolled hypertension can lead to:  A heart attack.  A stroke.  A weakened blood vessel (aneurysm).  Heart failure.  Kidney damage.  Eye damage.  Metabolic syndrome.  Memory and concentration problems.  What changes can I make to manage my hypertension? Hypertension can be managed by making lifestyle changes and possibly by taking medicines. Your health care provider will help you make a plan to bring your blood pressure within a normal range. Eating and drinking  Eat a diet that is high in fiber and potassium, and low in salt (sodium), added sugar, and fat. An example eating plan is called the DASH (Dietary Approaches to Stop Hypertension) diet. To eat this way: ? Eat plenty of fresh fruits and vegetables. Try to fill half of your plate at each meal with fruits and vegetables. ? Eat whole grains, such as whole wheat pasta, brown rice, or whole grain bread. Fill about one quarter of your plate with whole grains. ? Eat low-fat diary products. ? Avoid fatty cuts of meat, processed or cured meats, and poultry with skin. Fill about one quarter of your plate with lean proteins such as fish, chicken without skin, beans, eggs, and tofu. ? Avoid premade and processed foods. These tend to be higher in sodium, added sugar, and fat.  Reduce your daily sodium intake. Most people with hypertension should eat less than 1,500 mg of sodium a day.  Limit alcohol intake to no more than 1 drink a day for nonpregnant women and 2 drinks a day for men. One drink equals  12 oz of beer, 5 oz of wine, or 1 oz of hard liquor. Lifestyle  Work with your health care provider to maintain a healthy body weight, or to lose weight. Ask what an ideal weight is for you.  Get at least 30 minutes of exercise that causes your heart to beat faster (aerobic exercise) most days of the week.  Activities may include walking, swimming, or biking.  Include exercise to strengthen your muscles (resistance exercise), such as weight lifting, as part of your weekly exercise routine. Try to do these types of exercises for 30 minutes at least 3 days a week.  Do not use any products that contain nicotine or tobacco, such as cigarettes and e-cigarettes. If you need help quitting, ask your health care provider.  Control any long-term (chronic) conditions you have, such as high cholesterol or diabetes. Monitoring  Monitor your blood pressure at home as told by your health care provider. Your personal target blood pressure may vary depending on your medical conditions, your age, and other factors.  Have your blood pressure checked regularly, as often as told by your health care provider. Working with your health care provider  Review all the medicines you take with your health care provider because there may be side effects or interactions.  Talk with your health care provider about your diet, exercise habits, and other lifestyle factors that may be contributing to hypertension.  Visit your health care provider regularly. Your health care provider can help you create and adjust your plan for managing hypertension. Will I need medicine to control my blood pressure? Your health care provider may prescribe medicine if lifestyle changes are not enough to get your blood pressure under control, and if:  Your systolic blood pressure is 130 or higher.  Your diastolic blood pressure is 80 or higher.  Take medicines only as told by your health care provider. Follow the directions carefully. Blood pressure medicines must be taken as prescribed. The medicine does not work as well when you skip doses. Skipping doses also puts you at risk for problems. Contact a health care provider if:  You think you are having a reaction to medicines you have taken.  You have repeated (recurrent) headaches.  You  feel dizzy.  You have swelling in your ankles.  You have trouble with your vision. Get help right away if:  You develop a severe headache or confusion.  You have unusual weakness or numbness, or you feel faint.  You have severe pain in your chest or abdomen.  You vomit repeatedly.  You have trouble breathing. Summary  Hypertension is when the force of blood pumping through your arteries is too strong. If this condition is not controlled, it may put you at risk for serious complications.  Your personal target blood pressure may vary depending on your medical conditions, your age, and other factors. For most people, a normal blood pressure is less than 120/80.  Hypertension is managed by lifestyle changes, medicines, or both. Lifestyle changes include weight loss, eating a healthy, low-sodium diet, exercising more, and limiting alcohol. This information is not intended to replace advice given to you by your health care provider. Make sure you discuss any questions you have with your health care provider. Document Released: 10/08/2011 Document Revised: 12/12/2015 Document Reviewed: 12/12/2015 Elsevier Interactive Patient Education  [employer].

## 2017-09-21 NOTE — Progress Notes (Signed)
Subjective:    Patient ID: Ronald Karvonen., male    DOB: 04-02-1970, 47 y.o.   MRN: [id number]  CC: Ronald Zajkowski. is a 47 y.o. male who presents today for follow up.   HPI: DM- FBG 225-275 , on 24 units of basaglar, on victoza  HTN- compliant with medication. Doesn't check BP at home.  Looking for new job today and thinks it is elevated because of stress. Denies exertional chest pain or pressure, numbness or tingling radiating to left arm or jaw, palpitations, dizziness, frequent headaches, changes in vision, or shortness of breath.    Would like 90 day supply of all medications as looses insurance in 3 weeks. Continues to follow with cialis 20mg . Follows with Larene Beach, urology rountinely and would like courtesy refill of Cialis      :  Past Medical History:  Diagnosis Date  . Arthritis   . BPH (benign prostatic hyperplasia)   . Diabetes ([place])   . Dysuria   . GERD (gastroesophageal reflux disease)   . History of kidney stones   . HTN (hypertension)   . Hyperlipidemia   . Impotence   . Nocturia   . Obesity   . Pneumonia age 59's  . Sleep apnea    CPAP - severe (sleep study 01/11/16)  . Testicular hypofunction   . Wears dentures    full upper and lower   Past Surgical History:  Procedure Laterality Date  . CHOLECYSTECTOMY    . NASAL SEPTOPLASTY W/ TURBINOPLASTY N/A 05/20/2017   Procedure: NASAL SEPTOPLASTY WITH SUBMUCOUS RESECTION;  Surgeon: Clyde Canterbury, MD;  Location: [facility] ORS;  Service: ENT;  Laterality: N/A;  . VASECTOMY     Family History  Problem Relation Age of Onset  . Hyperlipidemia Mother   . Diabetes Mellitus II Mother   . Seizures Mother   . Diabetes Mother   . Mental illness Mother   . Arthritis Mother   . Diabetes Father   . Cirrhosis Father        non alcoholic  . Hyperlipidemia Father   . Arthritis Father   . Liver cancer Father   . Diabetes Maternal Grandmother   . Hyperlipidemia Maternal Grandmother   . Arthritis Maternal  Grandmother   . Diabetes Maternal Grandfather   . Hyperlipidemia Maternal Grandfather   . Arthritis Maternal Grandfather   . Diabetes Paternal Grandmother   . Hyperlipidemia Paternal Grandmother   . Arthritis Paternal Grandmother   . Diabetes Paternal Grandfather   . Hyperlipidemia Paternal Grandfather   . Arthritis Paternal Grandfather   . Kidney disease Neg Hx   . Prostate cancer Neg Hx   . Kidney cancer Neg Hx   . Bladder Cancer Neg Hx   . Colon cancer Neg Hx     Allergies: Patient has no known allergies. Current Outpatient Medications on File Prior to Visit  Medication Sig Dispense Refill  . Insulin Glargine (BASAGLAR KWIKPEN) 100 UNIT/ML SOPN Inject 0.24 mLs (24 Units total) into the skin every morning. With food 5 pen 1  . Insulin Pen Needle (NOVOFINE) 30G X 8 MM MISC Use to inject insulin and Victoza each 1x per day E11.9 180 each 3  . liraglutide (VICTOZA) 18 MG/3ML SOPN Inject 0.2 mLs (1.2 mg total) into the skin daily. 5 pen 1  . testosterone cypionate (DEPOTESTOSTERONE CYPIONATE) 200 MG/ML injection Inject 1 mL (200 mg total) into the muscle every 14 (fourteen) days. 10 mL 0   No current facility-administered medications on  file prior to visit.     Social History   Tobacco Use  . Smoking status: Current Every Day Smoker    Packs/day: 0.50    Years: 20.00    Pack years: 10.00    Types: Cigarettes  . Smokeless tobacco: Never Used  Substance Use Topics  . Alcohol use: No    Alcohol/week: 0.0 standard drinks  . Drug use: No    Review of Systems  Constitutional: Negative for chills and fever.  Eyes: Negative for visual disturbance.  Respiratory: Negative for cough.   Cardiovascular: Negative for chest pain and palpitations.  Gastrointestinal: Negative for nausea and vomiting.  Neurological: Negative for headaches.      Objective:    BP (!) 130/100   Pulse 80   Temp 98 F (36.7 C) (Oral)   Resp 15   Ht 6' (1.829 m)   Wt 234 lb 2 oz (106.2 kg)   SpO2  98%   BMI 31.75 kg/m  BP Readings from Last 3 Encounters:  09/21/17 (!) 130/100  07/28/17 (!) 128/92  06/09/17 105/74   Wt Readings from Last 3 Encounters:  09/21/17 234 lb 2 oz (106.2 kg)  07/28/17 240 lb 4 oz (109 kg)  06/09/17 237 lb 12.8 oz (107.9 kg)    Physical Exam  Constitutional: He appears well-developed and well-nourished.  HENT:  Right Ear: Hearing normal.  Left Ear: Hearing normal.  Mouth/Throat: Uvula is midline, oropharynx is clear and moist and mucous membranes are normal. No posterior oropharyngeal edema or posterior oropharyngeal erythema.  Eyes: Pupils are equal, round, and reactive to light. Conjunctivae, EOM and lids are normal. Lids are everted and swept, no foreign bodies found.  Normal fundus bilaterally.  Cardiovascular: Regular rhythm and normal heart sounds.  Pulmonary/Chest: Effort normal and breath sounds normal. No respiratory distress. He has no wheezes. He has no rhonchi. He has no rales.  Lymphadenopathy:       Head (right side): No submental, no submandibular, no tonsillar, no preauricular, no posterior auricular and no occipital adenopathy present.       Head (left side): No submental, no submandibular, no tonsillar, no preauricular, no posterior auricular and no occipital adenopathy present.    He has no cervical adenopathy.  Neurological: He is alert. He has normal strength. No cranial nerve deficit or sensory deficit. He displays a negative Romberg sign.  Reflex Scores:      Bicep reflexes are 2+ on the right side and 2+ on the left side.      Patellar reflexes are 2+ on the right side and 2+ on the left side. Grip equal and strong bilateral upper extremities. Gait strong and steady. Able to perform rapid alternating movement without difficulty.  Skin: Skin is warm and dry.  Psychiatric: He has a normal mood and affect. His speech is normal and behavior is normal.  Vitals reviewed.      Assessment & Plan:   Problem List Items Addressed  This Visit      Cardiovascular and Mediastinum   HTN (hypertension) - Primary    Elevated today. Will start lisinopril ( he is also diabetic). Close follow up with myself and pharmacist, Catie.       Relevant Medications   amLODipine (NORVASC) 10 MG tablet   atorvastatin (LIPITOR) 40 MG tablet   tadalafil (ADCIRCA/CIALIS) 20 MG tablet   lisinopril (PRINIVIL,ZESTRIL) 5 MG tablet     Endocrine   DM (diabetes mellitus) (HCC)    Uncontrolled. Last a1c 13.6  07/01/17. Consult with Catie, pharmacist to work with patient regarding DM control, financial affordability of medications. Follow up with her today and in 2 weeks.       Relevant Medications   atorvastatin (LIPITOR) 40 MG tablet   lisinopril (PRINIVIL,ZESTRIL) 5 MG tablet     Genitourinary   Erectile dysfunction of organic origin   Relevant Medications   tadalafil (ADCIRCA/CIALIS) 20 MG tablet   BPH with obstruction/lower urinary tract symptoms    Courtesy refill of cialis. Advised future refills need to come from urology        Other   HLD (hyperlipidemia)   Relevant Medications   amLODipine (NORVASC) 10 MG tablet   atorvastatin (LIPITOR) 40 MG tablet   tadalafil (ADCIRCA/CIALIS) 20 MG tablet   lisinopril (PRINIVIL,ZESTRIL) 5 MG tablet    Other Visit Diagnoses    Need for influenza vaccination       Relevant Orders   Flu Vaccine QUAD 6+ mos PF IM (Fluarix Quad PF)   Need for immunization against influenza       Relevant Orders   Flu Vaccine QUAD 36+ mos IM (Completed)   Gastroesophageal reflux disease, esophagitis presence not specified       Relevant Medications   omeprazole (PRILOSEC) 20 MG capsule   Seasonal allergies       Relevant Medications   cetirizine (ZYRTEC) 10 MG tablet       I have discontinued Ronald Regal. Kropf Jr.'s vardenafil, HYDROcodone-acetaminophen, and doxycycline. I have also changed his atorvastatin and cetirizine. Additionally, I am having him start on lisinopril. Lastly, I am having him  maintain his Insulin Pen Needle, liraglutide, testosterone cypionate, BASAGLAR KWIKPEN, amLODipine, omeprazole, and tadalafil.   Meds ordered this encounter  Medications  . amLODipine (NORVASC) 10 MG tablet    Sig: Take 1 tablet (10 mg total) by mouth daily.    Dispense:  90 tablet    Refill:  1    Order Specific Question:   Supervising Provider    Answer:   Deborra Medina L [[id number]]  . omeprazole (PRILOSEC) 20 MG capsule    Sig: TAKE 1 CAPSULE BY MOUTH EVERY DAY    Dispense:  90 capsule    Refill:  1    Order Specific Question:   Supervising Provider    Answer:   Deborra Medina L [[id number]]  . atorvastatin (LIPITOR) 40 MG tablet    Sig: Take 1 tablet (40 mg total) by mouth daily.    Dispense:  90 tablet    Refill:  1    Order Specific Question:   Supervising Provider    Answer:   Deborra Medina L [[id number]]  . cetirizine (ZYRTEC) 10 MG tablet    Sig: Take 1 tablet (10 mg total) by mouth daily.    Dispense:  90 tablet    Refill:  1    Order Specific Question:   Supervising Provider    Answer:   Deborra Medina L [[id number]]  . tadalafil (ADCIRCA/CIALIS) 20 MG tablet    Sig: Take 1 tablet (20 mg total) by mouth daily as needed for erectile dysfunction.    Dispense:  90 tablet    Refill:  0    Order Specific Question:   Supervising Provider    Answer:   Deborra Medina L [[id number]]  . lisinopril (PRINIVIL,ZESTRIL) 5 MG tablet    Sig: Take 1 tablet (5 mg total) by mouth daily.    Dispense:  90 tablet    Refill:  3  Order Specific Question:   Supervising Provider    Answer:   Crecencio Mc [[id number]]    Return precautions given.   Risks, benefits, and alternatives of the medications and treatment plan prescribed today were discussed, and patient expressed understanding.   Education regarding symptom management and diagnosis given to patient on AVS.  Continue to follow with Burnard Hawthorne, FNP for routine health maintenance.   Ronald Karvonen. and I agreed with plan.   Mable Paris,  FNP

## 2017-09-21 NOTE — Telephone Encounter (Signed)
Received request by Mable Paris, FNP to see patient for assistance with diabetes management.   Patient notes that he lost his job and will lose insurance coverage in ~ 3 weeks. He notes that he has ~ 2 pens of Basaglar and 1 pen of Victoza remaining, but anticipates that he will be due for a refill before his insurance ends.   Patient with diabetes currently uncontrolled, most recent A1C 11.7%. Per patient report, recent SMBG remain elevated in the 200s. Currently treated with Basaglar 24 units daily, Victoza 1.2 mg daily.   Plan:  - Contact [facility] [place] to determine logistics/process for sending patient prescriptions to this pharmacy. If this is not an option for the patient, will assess other options for medication assistance; ensure all medications are on discounted drug lists - At next visit with me in 2 weeks, check A1c; - Per Care Everywhere, patient was previously on Invokana and Janumet (sitagliptin-metformin); Will discuss history of any intolerances with patient.  - Consider addition of ACEi for BP control to target goal <140/90. Unable to calculate 10 year ASCVD risk score d/t not having lipid panel on file to stratify patient for more strict goal of <130/80 per ADA 2019 guidelines

## 2017-09-22 ENCOUNTER — Telehealth: Payer: Self-pay | Admitting: Pharmacist

## 2017-09-22 NOTE — Telephone Encounter (Signed)
Contacted [facility] to determine logistics for receiving medications from this location. Spoke to [place], a Transport planner. Provided Mr. [place] information to [place]. She stated that she would reach out to Mr. Timberman to determine if they would be able to help him out when his insurance ends. Elisabeth Cara my contact information.  Contacted Mr. Alfieri to let him know that Inez Catalina would be reaching out to him today.    Catie Darnelle Maffucci, PharmD PGY2 [facility] Pharm[facility] Resident Phone: [phone]

## 2017-09-23 ENCOUNTER — Telehealth: Payer: Self-pay | Admitting: Family

## 2017-09-23 DIAGNOSIS — Z23 Encounter for immunization: Secondary | ICD-10-CM

## 2017-09-23 DIAGNOSIS — I10 Essential (primary) hypertension: Secondary | ICD-10-CM

## 2017-09-23 MED ORDER — LISINOPRIL 10 MG PO TABS: 10.0000 mg | ORAL_TABLET | Freq: Every day | ORAL | 1 refills | Status: DC

## 2017-09-23 MED ORDER — LISINOPRIL 5 MG PO TABS: 5.0000 mg | ORAL_TABLET | Freq: Every day | ORAL | 3 refills | Status: DC

## 2017-09-23 NOTE — Telephone Encounter (Signed)
Spoke with patient he is already taking lisinopril 5 mg

## 2017-09-23 NOTE — Assessment & Plan Note (Signed)
Uncontrolled. Last a1c 13.6 07/01/17. Consult with Catie, pharmacist to work with patient regarding DM control, financial affordability of medications. Follow up with her today and in 2 weeks.

## 2017-09-23 NOTE — Telephone Encounter (Signed)
He needs to increase from 5mg  qd to 10mg . Please ensure lab recheck after increase.  I sent 10mg  dose to [facility]

## 2017-09-23 NOTE — Telephone Encounter (Signed)
Agree with plan. Sundee Garland, NP  

## 2017-09-23 NOTE — Assessment & Plan Note (Signed)
Courtesy refill of cialis. Advised future refills need to come from urology

## 2017-09-23 NOTE — Assessment & Plan Note (Signed)
Elevated today. Will start lisinopril ( he is also diabetic). Close follow up with myself and pharmacist, Catie.

## 2017-09-23 NOTE — Telephone Encounter (Signed)
Call pt  Apologize as I had meant to send in lisinopril for his BP on Monday and failed to do so.   I would like him to start low dose.  He will need to repeat his labs ( BMP) approx 5 days after starting  Advise him to monitor bp and make lab apppt

## 2017-09-24 NOTE — Telephone Encounter (Signed)
Left voice mail for patient to call back ok for PEC to speak to patient , see below message regarding medication

## 2017-09-25 NOTE — Telephone Encounter (Signed)
Patient returned call and advised of the note below by Mable Paris, FNP to increase Lisinopril to 10 mg daily and repeat BMET 5 days after start, patient verbalized understanding. Lab appointment scheduled for Wednesday, 09/30/17 at 0830.

## 2017-09-25 NOTE — Telephone Encounter (Signed)
Patient notified of results.

## 2017-09-30 ENCOUNTER — Other Ambulatory Visit (INDEPENDENT_AMBULATORY_CARE_PROVIDER_SITE_OTHER): Payer: 59

## 2017-09-30 DIAGNOSIS — I10 Essential (primary) hypertension: Secondary | ICD-10-CM

## 2017-09-30 LAB — BASIC METABOLIC PANEL
BUN: 12 mg/dL (ref 6–23)
CALCIUM: 9.1 mg/dL (ref 8.4–10.5)
CO2: 25 mEq/L (ref 19–32)
CREATININE: 0.97 mg/dL (ref 0.40–1.50)
Chloride: 104 mEq/L (ref 96–112)
GFR: 87.32 mL/min (ref >60.00)
Glucose, Bld: 298 mg/dL — ABNORMAL HIGH (ref 70–99)
Potassium: 4 mEq/L (ref 3.5–5.1)
Sodium: 138 mEq/L (ref 135–145)

## 2017-10-01 ENCOUNTER — Telehealth: Payer: Self-pay | Admitting: Pharmacy Technician

## 2017-10-01 NOTE — Telephone Encounter (Signed)
Spoke with patient over the phone.  Patient indicated that he is uninsured and interested in receiving medication assistance from [facility].  Mailing patient new patient packet.  Patient to collect proof of income and bring to [facility].  Patient understands that [facility] must receive 2019 financial documentation in order to determine eligibility.  [facility]

## 2017-11-24 ENCOUNTER — Other Ambulatory Visit: Payer: 59

## 2017-11-26 ENCOUNTER — Ambulatory Visit: Payer: 59 | Admitting: Urology

## 2017-11-30 ENCOUNTER — Other Ambulatory Visit: Payer: Self-pay | Admitting: Family Medicine

## 2017-11-30 DIAGNOSIS — N138 Other obstructive and reflux uropathy: Secondary | ICD-10-CM

## 2017-11-30 DIAGNOSIS — N401 Enlarged prostate with lower urinary tract symptoms: Principal | ICD-10-CM

## 2017-12-01 ENCOUNTER — Other Ambulatory Visit: Payer: BLUE CROSS/BLUE SHIELD

## 2017-12-01 ENCOUNTER — Encounter: Payer: Self-pay | Admitting: Family Medicine

## 2017-12-01 ENCOUNTER — Ambulatory Visit: Payer: BLUE CROSS/BLUE SHIELD | Admitting: Family Medicine

## 2017-12-01 VITALS — BP 130/94 | HR 94 | Temp 98.3°F | Ht 72.0 in | Wt 234.8 lb

## 2017-12-01 DIAGNOSIS — N401 Enlarged prostate with lower urinary tract symptoms: Secondary | ICD-10-CM | POA: Diagnosis not present

## 2017-12-01 DIAGNOSIS — I10 Essential (primary) hypertension: Secondary | ICD-10-CM | POA: Diagnosis not present

## 2017-12-01 DIAGNOSIS — N138 Other obstructive and reflux uropathy: Secondary | ICD-10-CM

## 2017-12-01 DIAGNOSIS — E1165 Type 2 diabetes mellitus with hyperglycemia: Secondary | ICD-10-CM | POA: Diagnosis not present

## 2017-12-01 LAB — CBC WITH DIFFERENTIAL/PLATELET
Basophils Absolute: 0.1 10*3/uL (ref 0.0–0.1)
Basophils Relative: 1.2 % (ref 0.0–3.0)
Eosinophils Absolute: 0.2 10*3/uL (ref 0.0–0.7)
Eosinophils Relative: 3.6 % (ref 0.0–5.0)
HCT: 47 % (ref 39.0–52.0)
Hemoglobin: 15.8 g/dL (ref 13.0–17.0)
LYMPHS ABS: 1 10*3/uL (ref 0.7–4.0)
Lymphocytes Relative: 17.4 % (ref 12.0–46.0)
MCHC: 33.6 g/dL (ref 30.0–36.0)
MCV: 89.8 fl (ref 78.0–100.0)
MONOS PCT: 6.7 % (ref 3.0–12.0)
Monocytes Absolute: 0.4 10*3/uL (ref 0.1–1.0)
NEUTROS PCT: 71.1 % (ref 43.0–77.0)
Neutro Abs: 3.9 10*3/uL (ref 1.4–7.7)
Platelets: 163 10*3/uL (ref 150.0–400.0)
RBC: 5.23 Mil/uL (ref 4.22–5.81)
RDW: 14.4 % (ref 11.5–15.5)
WBC: 5.5 10*3/uL (ref 4.0–10.5)

## 2017-12-01 LAB — HEMOGLOBIN A1C: Hgb A1c MFr Bld: 12.3 % — ABNORMAL HIGH (ref 4.6–6.5)

## 2017-12-01 MED ORDER — BASAGLAR KWIKPEN 100 UNIT/ML ~~LOC~~ SOPN: 26.0000 [IU] | PEN_INJECTOR | SUBCUTANEOUS | 1 refills | Status: DC

## 2017-12-01 MED ORDER — SITAGLIPTIN PHOS-METFORMIN HCL 50-1000 MG PO TABS: 1.0000 | ORAL_TABLET | Freq: Two times a day (BID) | ORAL | 1 refills | Status: DC

## 2017-12-01 MED ORDER — LIRAGLUTIDE 18 MG/3ML ~~LOC~~ SOPN: 1.2000 mg | PEN_INJECTOR | Freq: Every day | SUBCUTANEOUS | 1 refills | Status: DC

## 2017-12-01 NOTE — Progress Notes (Signed)
Subjective:    Patient ID: Ronald Pruitt., male    DOB: 1970/06/20, 47 y.o.   MRN: [id number]  HPI  Patient presents to clinic to discuss blood sugars, they have been elevated.   Most recent A1c I can find in chart is from 02/2017, 11.7%. It looks like one was ordered for June, but lab was never collected.   Patient currently is using Basaglar 24 units at bedtime and Victoza 1.2 mg daily.  His blood sugars have been running in the high 200s, mid 300s and has had a few in the 400s and occasional 500.  Patient states he ended up on [place] after he lost his insurance a few months ago, these were determined to be cost effective options via pharmaceutical drug company programs that allowed him to qualify for medication assistance.  Patient now has his insurance back and would like to explore options of adding back in different oral medications.  Patient states he tolerated Janumet very well, and remember his blood sugars being well controlled.  BP has been stable.  Denies any chest pain, shortness of breath, palpitations, feeling sweaty or faint.  Denies abdominal pain, no nausea, vomiting or diarrhea.  Recent labs reviewed: Lab Results  Component Value Date   HGBA1C 11.7 (H) 03/11/2017   BMP Latest Ref Rng & Units 09/30/2017 04/28/2017 03/11/2017  Glucose 70 - 99 mg/dL 298(H) 280(H) 454(H)  BUN 6 - 23 mg/dL 12 10 14   Creatinine 0.40 - 1.50 mg/dL 0.97 0.78 0.98  BUN/Creat Ratio 6 - 22 (calc) - NOT APPLICABLE -  Sodium 628 - 145 mEq/L 138 141 136  Potassium 3.5 - 5.1 mEq/L 4.0 4.3 4.1  Chloride 96 - 112 mEq/L 104 103 102  CO2 19 - 32 mEq/L 25 26 26   Calcium 8.4 - 10.5 mg/dL 9.1 9.0 8.8    Patient Active Problem List   Diagnosis Date Noted  . Acute recurrent sinusitis 03/25/2017  . Fatty liver disease, nonalcoholic 31/51/7616  . HLD (hyperlipidemia) 08/26/2016  . Nocturia 08/18/2016  . DM (diabetes mellitus) ([place]) 08/18/2016  . HTN (hypertension) 08/18/2016  . OSA  (obstructive sleep apnea) 08/18/2016  . Erectile dysfunction of organic origin 03/15/2015  . Hypogonadism in male 03/15/2015  . BPH with obstruction/lower urinary tract symptoms 03/15/2015   Social History   Tobacco Use  . Smoking status: Current Every Day Smoker    Packs/day: 0.50    Years: 20.00    Pack years: 10.00    Types: Cigarettes  . Smokeless tobacco: Never Used  Substance Use Topics  . Alcohol use: No    Alcohol/week: 0.0 standard drinks   Review of Systems  Constitutional: Negative for chills, fatigue and fever.  HENT: Negative for congestion, ear pain, sinus pain and sore throat.   Eyes: Negative.   Respiratory: Negative for cough, shortness of breath and wheezing.   Cardiovascular: Negative for chest pain, palpitations and leg swelling.  Gastrointestinal: Negative for abdominal pain, diarrhea, nausea and vomiting.  Genitourinary: Negative for dysuria, frequency and urgency.  Musculoskeletal: Negative for arthralgias and myalgias.  Skin: Negative for color change, pallor and rash.  Neurological: Negative for syncope, light-headedness and headaches.  Psychiatric/Behavioral: The patient is not nervous/anxious.       Objective:   Physical Exam  Constitutional: He is oriented to person, place, and time. He appears well-nourished. No distress.  HENT:  Head: Normocephalic and atraumatic.  Mouth/Throat: Oropharynx is clear and moist. No oropharyngeal exudate.  Eyes: Conjunctivae  and EOM are normal. No scleral icterus.  Neck: Neck supple. No tracheal deviation present.  Cardiovascular: Normal rate, regular rhythm and normal heart sounds.  Pulmonary/Chest: Effort normal and breath sounds normal. No respiratory distress. He has no wheezes. He has no rales.  Musculoskeletal: He exhibits no edema.  Gait normal.   Neurological: He is alert and oriented to person, place, and time.  Skin: Skin is warm and dry. He is not diaphoretic. No pallor.  Psychiatric: He has a normal  mood and affect. His behavior is normal.  Nursing note and vitals reviewed.  Vitals:   12/01/17 0944  BP: (!) 130/94  Pulse: 94  Temp: 98.3 F (36.8 C)  SpO2: 94%      Assessment & Plan:   Type 2 DM with hyperglycemia -- we will increase Basaglar to 26 units at night from 24 units.  Patient will continue Victoza 1.2 mg.  We will also adding Janumet 50-1000 mg twice daily.  Patient given new prescription for test strips.  He will continue to keep a log of readings.  Patient advised that if he continues to have readings 500 and up he should go to emergency room right away for evaluation and treatment with insulin.  Essential hypertension-blood pressure well controlled at this time.  We will continue to monitor.  Patient will return to clinic in 2 to 3 weeks for recheck on blood sugar numbers with medication changes we will get new A1c in clinic today & check CBC. Recent BMP done in 09/2017 is stable.

## 2017-12-02 ENCOUNTER — Other Ambulatory Visit: Payer: Self-pay | Admitting: Family Medicine

## 2017-12-02 ENCOUNTER — Telehealth: Payer: Self-pay | Admitting: Lab

## 2017-12-02 DIAGNOSIS — E1165 Type 2 diabetes mellitus with hyperglycemia: Secondary | ICD-10-CM

## 2017-12-02 DIAGNOSIS — Z794 Long term (current) use of insulin: Principal | ICD-10-CM

## 2017-12-02 LAB — PSA: Prostate Specific Ag, Serum: 0.2 ng/mL (ref 0.0–4.0)

## 2017-12-02 MED ORDER — METFORMIN HCL 1000 MG PO TABS: 1000.0000 mg | ORAL_TABLET | Freq: Two times a day (BID) | ORAL | 3 refills | Status: DC

## 2017-12-02 NOTE — Telephone Encounter (Signed)
Called Pt regarding Rx change, since the Patient states Janumet is too expensive to stay on.  He can  continue basaglar 26 units at bedtime, Victoza 1.2 mg daily and Metformin 1000 mg 2x per day.  Cancel the janumet. Plain metformin sent instead  Okay for Pec to speak to Pt

## 2017-12-02 NOTE — Telephone Encounter (Signed)
Patient states Janumet is too expensive to stay on.  So we will have him continue basaglar 26 units at bedtime, Victoza 1.2 mg daily and Metformin 1000 mg 2x per day.  Cancel the janumet. Plain metformin sent instead.

## 2017-12-02 NOTE — Telephone Encounter (Signed)
Called Pt about his Rx, to see if he wanted to stay on Victoza or switch to [place]. But Pt didn't answer. Left a message for Pt to call back.

## 2017-12-02 NOTE — Progress Notes (Signed)
STOP JANUMET -- too expensive  Continue victoza and basaglar  Add metformin 1000 mg BID

## 2017-12-02 NOTE — Telephone Encounter (Signed)
Incoming call from patient. Message from L. Guse NP read to patient. Pt verbalized understanding of all instructions.

## 2017-12-08 ENCOUNTER — Ambulatory Visit (INDEPENDENT_AMBULATORY_CARE_PROVIDER_SITE_OTHER): Payer: BLUE CROSS/BLUE SHIELD | Admitting: Urology

## 2017-12-08 ENCOUNTER — Encounter: Payer: Self-pay | Admitting: Urology

## 2017-12-08 VITALS — BP 115/81 | HR 92 | Ht 72.0 in | Wt 232.6 lb

## 2017-12-08 DIAGNOSIS — E349 Endocrine disorder, unspecified: Secondary | ICD-10-CM

## 2017-12-08 DIAGNOSIS — N138 Other obstructive and reflux uropathy: Secondary | ICD-10-CM | POA: Diagnosis not present

## 2017-12-08 DIAGNOSIS — R351 Nocturia: Secondary | ICD-10-CM

## 2017-12-08 DIAGNOSIS — N401 Enlarged prostate with lower urinary tract symptoms: Secondary | ICD-10-CM

## 2017-12-08 DIAGNOSIS — N529 Male erectile dysfunction, unspecified: Secondary | ICD-10-CM

## 2017-12-08 NOTE — Progress Notes (Signed)
12/08/2017 10:54 AM   Ronald Pruitt. [birth year] [id number]  Referring provider: Burnard Hawthorne, FNP [address] [place], [place] [address]  No chief complaint on file.   HPI: Patient is a 47 year old Caucasian male with testosterone deficiency, ED and BPH with LU TS who presents today for 6 month follow-up.      Testosterone deficiency He is starting to have spontaneous erections at night.  He does have sleep apnea and is sleeping with his CPAP machine.  He is reporting a loss of body hair, reduced beard growth, obesity and type 2 diabetes mellitus.  His most recent testosterone level was 550 ng/dL on 04/15/2017.   He is currently managing his testosterone deficiency with nothing as he lost his insurance in August.  He has new employment and has gained new insurance.  His HCT and Hbg were normal on 12/01/2017.    He states he really hasn't noticed a difference since being off the testosterone therapy the last few months.  Erectile dysfunction His SHIM score is 11, which is moderate  ED.   His previous SHIM score was 11.  He has been having difficulty with erections for several years.   His major complaint is lack of firmness.  His libido is diminished   His risk factors for ED are age, BPH, testosterone deficiency, DM, HTN, HLD, sleep apnea, stress and night shift work.  He denies any painful erections or curvatures with his erections.   He is starting to have spontaneous erections.  He was taking tadalafil 20 mg daily.     SHIM    Row Name 12/08/17 1015         SHIM: Over the last 6 months:   How do you rate your confidence that you could get and keep an erection?  Very Low     When you had erections with sexual stimulation, how often were your erections hard enough for penetration (entering your partner)?  A Few Times (much less than half the time)     During sexual intercourse, how often were you able to maintain your erection after you had penetrated  (entered) your partner?  Sometimes (about half the time)     During sexual intercourse, how difficult was it to maintain your erection to completion of intercourse?  Difficult     When you attempted sexual intercourse, how often was it satisfactory for you?  A Few Times (much less than half the time)       SHIM Total Score   SHIM  11        Score: 1-7 Severe ED 8-11 Moderate ED 12-16 Mild-Moderate ED 17-21 Mild ED 22-25 No ED  BPH WITH LUTS His IPSS score today is 12, which is moderate lower urinary tract symptomatology. He is mostly dissatisfied with his quality life due to his urinary symptoms.   His previous IPSS score was 12/3.  His previous PVR was 42 mL.  He is having nocturia and straining to urinate, but he attributes this to his diabetes as his urinary symptoms are improving while his sugars are improving.  He has been sleeping with his CPAP machine.  His last HbgA1c was 12.3% in 11/2017.   He denies any dysuria, hematuria or suprapubic pain.  He also denies any recent fevers, chills, nausea or vomiting.  He does not have a family history of PCa. IPSS    Row Name 12/08/17 1000         International  Prostate Symptom Score   How often have you had the sensation of not emptying your bladder?  Less than 1 in 5     How often have you had to urinate less than every two hours?  About half the time     How often have you found you stopped and started again several times when you urinated?  Not at All     How often have you found it difficult to postpone urination?  Less than half the time     How often have you had a weak urinary stream?  Less than half the time     How often have you had to strain to start urination?  Less than half the time     How many times did you typically get up at night to urinate?  2 Times     Total IPSS Score  12       Quality of Life due to urinary symptoms   If you were to spend the rest of your life with your urinary condition just the way it is now  how would you feel about that?  Mostly Disatisfied        Score:  1-7 Mild 8-19 Moderate 20-35 Severe  Nephrolithiasis Incidental finding of a 13 mm left renal stone on 2017 CT.  KUB today (04/22/2017) noted a coarse approximately 10 x 12 mm calcification projecting over the upper pole of the left kidney may reflect a parenchymal calcification or a stone.  He does not have any flank pain or gross hematuria at this time.     PMH: Past Medical History:  Diagnosis Date  . Arthritis   . BPH (benign prostatic hyperplasia)   . Diabetes ([place])   . Dysuria   . GERD (gastroesophageal reflux disease)   . History of kidney stones   . HTN (hypertension)   . Hyperlipidemia   . Impotence   . Nocturia   . Obesity   . Pneumonia age 64's  . Sleep apnea    CPAP - severe (sleep study 01/11/16)  . Testicular hypofunction   . Wears dentures    full upper and lower    Surgical History: Past Surgical History:  Procedure Laterality Date  . CHOLECYSTECTOMY    . NASAL SEPTOPLASTY W/ TURBINOPLASTY N/A 05/20/2017   Procedure: NASAL SEPTOPLASTY WITH SUBMUCOUS RESECTION;  Surgeon: Clyde Canterbury, MD;  Location: [facility] ORS;  Service: ENT;  Laterality: N/A;  . VASECTOMY      Home Medications:  Allergies as of 12/08/2017   No Known Allergies     Medication List        Accurate as of 12/08/17 10:54 AM. Always use your most recent med list.          amLODipine 10 MG tablet Commonly known as:  NORVASC Take 1 tablet (10 mg total) by mouth daily.   atorvastatin 40 MG tablet Commonly known as:  LIPITOR Take 1 tablet (40 mg total) by mouth daily.   BASAGLAR KWIKPEN 100 UNIT/ML Sopn Inject 0.26 mLs (26 Units total) into the skin every morning. With food   cetirizine 10 MG tablet Commonly known as:  ZYRTEC Take 1 tablet (10 mg total) by mouth daily.   Insulin Pen Needle 30G X 8 MM Misc Commonly known as:  NOVOFINE Use to inject insulin and Victoza each 1x per day E11.9   liraglutide 18  MG/3ML Sopn Commonly known as:  VICTOZA Inject 0.2 mLs (1.2 mg total) into the skin  daily.   lisinopril 10 MG tablet Commonly known as:  PRINIVIL,ZESTRIL Take 1 tablet (10 mg total) by mouth daily.   metFORMIN 1000 MG tablet Commonly known as:  GLUCOPHAGE Take 1 tablet (1,000 mg total) by mouth 2 (two) times daily with a meal.   omeprazole 20 MG capsule Commonly known as:  PRILOSEC TAKE 1 CAPSULE BY MOUTH EVERY DAY   tadalafil 20 MG tablet Commonly known as:  ADCIRCA/CIALIS Take 1 tablet (20 mg total) by mouth daily as needed for erectile dysfunction.       Allergies: No Known Allergies  Family History: Family History  Problem Relation Age of Onset  . Hyperlipidemia Mother   . Diabetes Mellitus II Mother   . Seizures Mother   . Diabetes Mother   . Mental illness Mother   . Arthritis Mother   . Diabetes Father   . Cirrhosis Father        non alcoholic  . Hyperlipidemia Father   . Arthritis Father   . Liver cancer Father   . Diabetes Maternal Grandmother   . Hyperlipidemia Maternal Grandmother   . Arthritis Maternal Grandmother   . Diabetes Maternal Grandfather   . Hyperlipidemia Maternal Grandfather   . Arthritis Maternal Grandfather   . Diabetes Paternal Grandmother   . Hyperlipidemia Paternal Grandmother   . Arthritis Paternal Grandmother   . Diabetes Paternal Grandfather   . Hyperlipidemia Paternal Grandfather   . Arthritis Paternal Grandfather   . Kidney disease Neg Hx   . Prostate cancer Neg Hx   . Kidney cancer Neg Hx   . Bladder Cancer Neg Hx   . Colon cancer Neg Hx     Social History:  reports that he has been smoking cigarettes. He has a 10.00 pack-year smoking history. He has never used smokeless tobacco. He reports that he does not drink alcohol or use drugs.  ROS: UROLOGY Frequent Urination?: No Hard to postpone urination?: No Burning/pain with urination?: No Get up at night to urinate?: Yes Leakage of urine?: No Urine stream starts and  stops?: No Trouble starting stream?: No Do you have to strain to urinate?: Yes Blood in urine?: No Urinary tract infection?: No Sexually transmitted disease?: No Injury to kidneys or bladder?: No Painful intercourse?: No Weak stream?: No Erection problems?: Yes Penile pain?: No  Gastrointestinal Nausea?: No Vomiting?: No Indigestion/heartburn?: Yes Diarrhea?: Yes Constipation?: No  Constitutional Fever: No Night sweats?: No Weight loss?: No Fatigue?: Yes  Skin Skin rash/lesions?: No Itching?: No  Eyes Blurred vision?: No Double vision?: No  Ears/Nose/Throat Sore throat?: No Sinus problems?: No  Hematologic/Lymphatic Swollen glands?: No Easy bruising?: No  Cardiovascular Leg swelling?: No Chest pain?: No  Respiratory Cough?: No Shortness of breath?: No  Endocrine Excessive thirst?: Yes  Musculoskeletal Back pain?: No Joint pain?: No  Neurological Headaches?: No Dizziness?: No  Psychologic Depression?: No Anxiety?: No  Physical Exam: BP 115/81 (BP Location: Left Arm, Patient Position: Sitting, Cuff Size: Normal)   Pulse 92   Ht 6' (1.829 m)   Wt 232 lb 9.6 oz (105.5 kg)   BMI 31.55 kg/m   Constitutional: Well nourished. Alert and oriented, No acute distress. HEENT: [place] AT, moist mucus membranes. Trachea midline, no masses. Cardiovascular: No clubbing, cyanosis, or edema. Respiratory: Normal respiratory effort, no increased work of breathing. GI: Abdomen is soft, non tender, non distended, no abdominal masses. Liver and spleen not palpable.  No hernias appreciated.  Stool sample for occult testing is not indicated.   GU:  No CVA tenderness.  No bladder fullness or masses.  Patient with circumcised phallus.  Urethral meatus is patent.  No penile discharge. No penile lesions or rashes. Scrotum without lesions, cysts, rashes and/or edema.  Testicles are located scrotally bilaterally. No masses are appreciated in the testicles. Left and right  epididymis are normal. Rectal: Patient with  normal sphincter tone. Anus and perineum without scarring or rashes. No rectal masses are appreciated. Prostate is approximately 45 grams, no nodules are appreciated. Seminal vesicles are normal. Skin: No rashes, bruises or suspicious lesions. Lymph: No cervical or inguinal adenopathy. Neurologic: Grossly intact, no focal deficits, moving all 4 extremities. Psychiatric: Normal mood and affect.   Laboratory Data: PSA History  0.3 ng/mL on 04/00/2017  0.3 ng/mL on 04/21/2016  0.3 ng/mL on 10/15/2016  0.2 ng/mL on 04/15/2017  0.2 ng/mL on 12/01/2017 Lab Results  Component Value Date   WBC 5.5 12/01/2017   HGB 15.8 12/01/2017   HCT 47.0 12/01/2017   MCV 89.8 12/01/2017   PLT 163.0 12/01/2017    Lab Results  Component Value Date   CREATININE 0.97 09/30/2017    Lab Results  Component Value Date   AST 52 (H) 04/15/2017   Lab Results  Component Value Date   ALT 45 (H) 04/15/2017   I have reviewed the labs  Pertinent Imaging CLINICAL DATA:  Follow-up kidney stones.  No current complaints.  EXAM: ABDOMEN - 1 VIEW  COMPARISON:  None in PACs  FINDINGS: There is a coarse calcification that projects over the upper pole of the left kidney. This may lie within the cortex or within the renal collecting system. No other left-sided kidney stones are observed. No right-sided kidney stones are observed either. No ureteral or bladder stones are demonstrated. The bowel gas pattern is normal. There surgical clips in the gallbladder fossa. The bony structures are unremarkable.  IMPRESSION: Coarse approximately 10 x 12 mm calcification projecting over the upper pole of the left kidney may reflect a parenchymal calcification or a stone.   Electronically Signed   By: David  Martinique M.D.   On: 04/22/2017 16:44  Assessment & Plan:    1. Testosterone deficiency  Most recent testosterone level is 550ng/dL on 04/15/2017  (goal  450-600 ng/dL) Rechecked patient's testosterone today as he may be interested in restarting the therapy RTC pending labs  2. BPH with LUTS IPSS score is 12/4, it is slightly worse Continue conservative management, avoiding bladder irritants and timed voiding's RTC pending labs  3. Erectile dysfunction:    SHIM score is 11, it is stable Continue Cialis 20 mg, but he will split the tablet and take 1/2 every other day RTC pending labs  4. Nocturia He is sleeping with his CPAP machine Worsened with the worsening of his DM  5. Left renal stone KUB noted a stable 10 x 12 mm left upper pole stone Patient does not desire intervention at this time Recommend yearly KUB's (03/2018)  Return for pending labs.  These notes generated with voice recognition software. I apologize for typographical errors.  Zara Council, PA-C  [facility] [address] [place], [place] [address] [phone]

## 2017-12-09 LAB — TESTOSTERONE: Testosterone: 436 ng/dL (ref 264–916)

## 2017-12-10 ENCOUNTER — Other Ambulatory Visit: Payer: Self-pay | Admitting: Family

## 2017-12-10 DIAGNOSIS — N529 Male erectile dysfunction, unspecified: Secondary | ICD-10-CM

## 2017-12-10 NOTE — Telephone Encounter (Signed)
Last OV 09/21/2017   Last refilled 09/21/2017 disp 90 with no refills   Sent to PCP for approval

## 2017-12-14 ENCOUNTER — Encounter: Payer: Self-pay | Admitting: Family

## 2017-12-14 NOTE — Telephone Encounter (Signed)
Patient advised to contact urology

## 2017-12-14 NOTE — Telephone Encounter (Signed)
Call pt  This medication is from urology, shannon mcgowan.  Please advise him to request from  her

## 2017-12-15 NOTE — Telephone Encounter (Signed)
Called patient 12/14/17 and advised patient to contact urology . Patient verbalized understanding. See phone note for documentation.

## 2017-12-23 ENCOUNTER — Ambulatory Visit: Payer: BLUE CROSS/BLUE SHIELD | Admitting: Family Medicine

## 2017-12-23 DIAGNOSIS — Z0289 Encounter for other administrative examinations: Secondary | ICD-10-CM

## 2017-12-23 NOTE — Progress Notes (Deleted)
   Subjective:    Patient ID: Ronald Pruitt., male    DOB: 1970-07-22, 47 y.o.   MRN: [id number]  HPI  Presents to clinic for follow up on blood sugars after getting back on his diabetes medications. He has lost insurance, now has it back and is able to afford his Rx.  Patient Active Problem List   Diagnosis Date Noted  . Acute recurrent sinusitis 03/25/2017  . Fatty liver disease, nonalcoholic 32/67/1245  . HLD (hyperlipidemia) 08/26/2016  . Nocturia 08/18/2016  . DM (diabetes mellitus) ([place]) 08/18/2016  . HTN (hypertension) 08/18/2016  . OSA (obstructive sleep apnea) 08/18/2016  . Erectile dysfunction of organic origin 03/15/2015  . Hypogonadism in male 03/15/2015  . BPH with obstruction/lower urinary tract symptoms 03/15/2015   Social History   Tobacco Use  . Smoking status: Current Every Day Smoker    Packs/day: 0.50    Years: 20.00    Pack years: 10.00    Types: Cigarettes  . Smokeless tobacco: Never Used  Substance Use Topics  . Alcohol use: No    Alcohol/week: 0.0 standard drinks   Review of Systems   Constitutional: Negative for chills, fatigue and fever.  HENT: Negative for congestion, ear pain, sinus pain and sore throat.   Eyes: Negative.   Respiratory: Negative for cough, shortness of breath and wheezing.   Cardiovascular: Negative for chest pain, palpitations and leg swelling.  Gastrointestinal: Negative for abdominal pain, diarrhea, nausea and vomiting.  Genitourinary: Negative for dysuria, frequency and urgency.  Musculoskeletal: Negative for arthralgias and myalgias.  Skin: Negative for color change, pallor and rash.  Neurological: Negative for syncope, light-headedness and headaches.  Psychiatric/Behavioral: The patient is not nervous/anxious.       Objective:   Physical Exam        Assessment & Plan:

## 2018-02-03 ENCOUNTER — Telehealth: Payer: Self-pay

## 2018-02-03 ENCOUNTER — Encounter: Payer: Self-pay | Admitting: Family

## 2018-02-03 NOTE — Progress Notes (Unsigned)
Called back in to return call. Advised Pt per triage nurse to check sugar again and if it reads over 400 go to ED if below call back to schedule appt. Pt checked sugar at 5:45 am this morning and it was down to 350. Pt HIGH reading on meter was on 1.07.2020

## 2018-02-03 NOTE — Telephone Encounter (Signed)
LMTCB regarding blood sugars ready HIGH. I asked that he call back to make acute visit here at office or go to ED for possible ketoacidosis.

## 2018-02-20 ENCOUNTER — Other Ambulatory Visit: Payer: Self-pay | Admitting: Family

## 2018-02-20 DIAGNOSIS — J302 Other seasonal allergic rhinitis: Secondary | ICD-10-CM

## 2018-03-15 ENCOUNTER — Telehealth: Payer: Self-pay | Admitting: Urology

## 2018-03-15 DIAGNOSIS — N529 Male erectile dysfunction, unspecified: Secondary | ICD-10-CM

## 2018-03-15 NOTE — Telephone Encounter (Signed)
Pt would like a new RX for Cialis 20 mg sent to [facility] on [address].  His old insurance used to only pay for 6, but new insurance will pay for 12.  He has been getting from pcp, but they said they would rather him get this from [place].

## 2018-03-16 ENCOUNTER — Other Ambulatory Visit: Payer: Self-pay | Admitting: Urology

## 2018-03-16 MED ORDER — TADALAFIL 20 MG PO TABS: 20.0000 mg | ORAL_TABLET | Freq: Every day | ORAL | 3 refills | Status: DC | PRN

## 2018-03-16 MED ORDER — TADALAFIL 20 MG PO TABS: 20.0000 mg | ORAL_TABLET | Freq: Every day | ORAL | 0 refills | Status: DC | PRN

## 2018-03-16 NOTE — Telephone Encounter (Signed)
Script sent in for Cialis 20 mg, #12 to [facility] on [address].

## 2018-03-21 ENCOUNTER — Encounter: Payer: Self-pay | Admitting: Emergency Medicine

## 2018-03-21 ENCOUNTER — Other Ambulatory Visit: Payer: Self-pay

## 2018-03-21 DIAGNOSIS — Z9049 Acquired absence of other specified parts of digestive tract: Secondary | ICD-10-CM | POA: Insufficient documentation

## 2018-03-21 DIAGNOSIS — Z794 Long term (current) use of insulin: Secondary | ICD-10-CM | POA: Diagnosis not present

## 2018-03-21 DIAGNOSIS — I10 Essential (primary) hypertension: Secondary | ICD-10-CM | POA: Insufficient documentation

## 2018-03-21 DIAGNOSIS — E1165 Type 2 diabetes mellitus with hyperglycemia: Secondary | ICD-10-CM | POA: Insufficient documentation

## 2018-03-21 DIAGNOSIS — Z79899 Other long term (current) drug therapy: Secondary | ICD-10-CM | POA: Diagnosis not present

## 2018-03-21 DIAGNOSIS — F17210 Nicotine dependence, cigarettes, uncomplicated: Secondary | ICD-10-CM | POA: Diagnosis not present

## 2018-03-21 DIAGNOSIS — R739 Hyperglycemia, unspecified: Secondary | ICD-10-CM | POA: Diagnosis present

## 2018-03-21 LAB — CBC
HCT: 45.9 % (ref 39.0–52.0)
Hemoglobin: 15.4 g/dL (ref 13.0–17.0)
MCH: 29.4 pg (ref 26.0–34.0)
MCHC: 33.6 g/dL (ref 30.0–36.0)
MCV: 87.8 fL (ref 80.0–100.0)
Platelets: 169 10*3/uL (ref 150–400)
RBC: 5.23 MIL/uL (ref 4.22–5.81)
RDW: 13.2 % (ref 11.5–15.5)
WBC: 6.6 10*3/uL (ref 4.0–10.5)
nRBC: 0 % (ref 0.0–0.2)

## 2018-03-21 LAB — BASIC METABOLIC PANEL
Anion gap: 11 (ref 5–15)
BUN: 11 mg/dL (ref 6–20)
CO2: 22 mmol/L (ref 22–32)
Calcium: 9.2 mg/dL (ref 8.9–10.3)
Chloride: 100 mmol/L (ref 98–111)
Creatinine, Ser: 0.78 mg/dL (ref 0.61–1.24)
GFR calc Af Amer: >60 mL/min (ref >60)
GFR calc non Af Amer: >60 mL/min (ref >60)
Glucose, Bld: 520 mg/dL (ref 70–99)
Potassium: 4.2 mmol/L (ref 3.5–5.1)
Sodium: 133 mmol/L — ABNORMAL LOW (ref 135–145)

## 2018-03-21 LAB — URINALYSIS, COMPLETE (UACMP) WITH MICROSCOPIC
BACTERIA UA: NONE SEEN
Bilirubin Urine: NEGATIVE
Glucose, UA: >=500 mg/dL — AB
Hgb urine dipstick: NEGATIVE
Ketones, ur: NEGATIVE mg/dL
Leukocytes,Ua: NEGATIVE
Nitrite: NEGATIVE
PH: 7 (ref 5.0–8.0)
Protein, ur: NEGATIVE mg/dL
Specific Gravity, Urine: 1.029 (ref 1.005–1.030)
Squamous Epithelial / HPF: NONE SEEN (ref 0–5)

## 2018-03-21 LAB — GLUCOSE, CAPILLARY: Glucose-Capillary: 493 mg/dL — ABNORMAL HIGH (ref 70–99)

## 2018-03-21 MED ORDER — SODIUM CHLORIDE 0.9 % IV BOLUS
500.0000 mL | Freq: Once | INTRAVENOUS | Status: AC
  Administered 2018-03-21: 500 mL via INTRAVENOUS

## 2018-03-21 NOTE — ED Triage Notes (Signed)
Pt reports blood sugar has been running high for sometime and over the last 2 to 3 days it has been in the 500 level. Today it was over 600 on his glucometer.

## 2018-03-21 NOTE — ED Notes (Signed)
This RN spoke with Dr Jacqualine Code about patient and new orders received.

## 2018-03-22 ENCOUNTER — Emergency Department
Admission: EM | Admit: 2018-03-22 | Discharge: 2018-03-22 | Disposition: A | Discharge status: Home / Self Care | Payer: BLUE CROSS/BLUE SHIELD | Attending: Emergency Medicine | Admitting: Emergency Medicine

## 2018-03-22 DIAGNOSIS — R739 Hyperglycemia, unspecified: Secondary | ICD-10-CM

## 2018-03-22 LAB — GLUCOSE, CAPILLARY
Glucose-Capillary: 535 mg/dL (ref 70–99)
Glucose-Capillary: 310 mg/dL — ABNORMAL HIGH (ref 70–99)
Glucose-Capillary: 288 mg/dL — ABNORMAL HIGH (ref 70–99)

## 2018-03-22 MED ORDER — SODIUM CHLORIDE 0.9 % IV BOLUS
500.0000 mL | Freq: Once | INTRAVENOUS | Status: AC
  Administered 2018-03-22: 500 mL via INTRAVENOUS

## 2018-03-22 NOTE — Discharge Instructions (Signed)

## 2018-03-22 NOTE — ED Notes (Signed)
Peripheral IV discontinued. Catheter intact. No signs of infiltration or redness. Gauze applied to IV site.   Discharge instructions reviewed with patient. Questions fielded by this RN. Patient verbalizes understanding of instructions. Patient discharged home in stable condition per [facility]. No acute distress noted at time of discharge.    

## 2018-03-22 NOTE — ED Provider Notes (Signed)
[facility] Emergency Department Provider Note  ____________________________________________   First MD Initiated Contact with Patient 03/22/18 [phone]     (approximate)  I have reviewed the triage vital signs and the nursing notes.   HISTORY  Chief Complaint Hyperglycemia    HPI Ronald Pruitt. is a 48 y.o. male with poorly controlled diabetes on insulin and oral medications.  He presents by private vehicle for evaluation of a high blood glucose reading at home.  He reports he has been taking his medication as prescribed but the doctor has not been able to find a regimen that works well for him.  He has had increased thirst and increased urination.  Nothing in particular makes his symptoms better or worse and they are severe and gradual in onset.  He denies headache, visual changes, chest pain, shortness of breath, nausea, vomiting, and abdominal pain.      Past Medical History:  Diagnosis Date  . Arthritis   . BPH (benign prostatic hyperplasia)   . Diabetes ([place])   . Dysuria   . GERD (gastroesophageal reflux disease)   . History of kidney stones   . HTN (hypertension)   . Hyperlipidemia   . Impotence   . Nocturia   . Obesity   . Pneumonia age 78's  . Sleep apnea    CPAP - severe (sleep study 01/11/16)  . Testicular hypofunction   . Wears dentures    full upper and lower    Patient Active Problem List   Diagnosis Date Noted  . Acute recurrent sinusitis 03/25/2017  . Fatty liver disease, nonalcoholic 75/17/0017  . HLD (hyperlipidemia) 08/26/2016  . Nocturia 08/18/2016  . DM (diabetes mellitus) ([place]) 08/18/2016  . HTN (hypertension) 08/18/2016  . OSA (obstructive sleep apnea) 08/18/2016  . Erectile dysfunction of organic origin 03/15/2015  . Hypogonadism in male 03/15/2015  . BPH with obstruction/lower urinary tract symptoms 03/15/2015    Past Surgical History:  Procedure Laterality Date  . CHOLECYSTECTOMY    . NASAL SEPTOPLASTY  W/ TURBINOPLASTY N/A 05/20/2017   Procedure: NASAL SEPTOPLASTY WITH SUBMUCOUS RESECTION;  Surgeon: Clyde Canterbury, MD;  Location: [facility] ORS;  Service: ENT;  Laterality: N/A;  . VASECTOMY      Prior to Admission medications   Medication Sig Start Date End Date Taking? Authorizing Provider  amLODipine (NORVASC) 10 MG tablet Take 1 tablet (10 mg total) by mouth daily. 09/21/17  Yes Burnard Hawthorne, FNP  atorvastatin (LIPITOR) 40 MG tablet Take 1 tablet (40 mg total) by mouth daily. 09/21/17  Yes Arnett, Yvetta Coder, FNP  EQ ALLERGY RELIEF, CETIRIZINE, 10 MG tablet TAKE 1 TABLET BY MOUTH ONCE DAILY 02/22/18  Yes Burnard Hawthorne, FNP  Insulin Glargine (BASAGLAR KWIKPEN) 100 UNIT/ML SOPN Inject 0.26 mLs (26 Units total) into the skin every morning. With food Patient taking differently: Inject 24 Units into the skin every morning. With food 12/01/17  Yes Guse, Jacquelynn Cree, FNP  Insulin Pen Needle (NOVOFINE) 30G X 8 MM MISC Use to inject insulin and Victoza each 1x per day E11.9 03/15/17  Yes McLean-Scocuzza, Nino Glow, MD  liraglutide (VICTOZA) 18 MG/3ML SOPN Inject 0.2 mLs (1.2 mg total) into the skin daily. Patient taking differently: Inject 1.8 mg into the skin daily.  12/01/17  Yes Guse, Jacquelynn Cree, FNP  lisinopril (PRINIVIL,ZESTRIL) 10 MG tablet Take 1 tablet (10 mg total) by mouth daily. 09/23/17  Yes Burnard Hawthorne, FNP  metFORMIN (GLUCOPHAGE) 1000 MG tablet Take 1 tablet (1,000  mg total) by mouth 2 (two) times daily with a meal. 12/02/17  Yes Guse, Jacquelynn Cree, FNP  omeprazole (PRILOSEC) 20 MG capsule TAKE 1 CAPSULE BY MOUTH EVERY DAY 09/21/17  Yes Burnard Hawthorne, FNP  tadalafil (ADCIRCA/CIALIS) 20 MG tablet Take 1 tablet (20 mg total) by mouth daily as needed for erectile dysfunction. 03/16/18  Yes McGowan, Larene Beach A, PA-C    Allergies Patient has no known allergies.  Family History  Problem Relation Age of Onset  . Hyperlipidemia Mother   . Diabetes Mellitus II Mother   . Seizures Mother   .  Diabetes Mother   . Mental illness Mother   . Arthritis Mother   . Diabetes Father   . Cirrhosis Father        non alcoholic  . Hyperlipidemia Father   . Arthritis Father   . Liver cancer Father   . Diabetes Maternal Grandmother   . Hyperlipidemia Maternal Grandmother   . Arthritis Maternal Grandmother   . Diabetes Maternal Grandfather   . Hyperlipidemia Maternal Grandfather   . Arthritis Maternal Grandfather   . Diabetes Paternal Grandmother   . Hyperlipidemia Paternal Grandmother   . Arthritis Paternal Grandmother   . Diabetes Paternal Grandfather   . Hyperlipidemia Paternal Grandfather   . Arthritis Paternal Grandfather   . Kidney disease Neg Hx   . Prostate cancer Neg Hx   . Kidney cancer Neg Hx   . Bladder Cancer Neg Hx   . Colon cancer Neg Hx     Social History Social History   Tobacco Use  . Smoking status: Current Every Day Smoker    Packs/day: 0.50    Years: 20.00    Pack years: 10.00    Types: Cigarettes  . Smokeless tobacco: Never Used  Substance Use Topics  . Alcohol use: No    Alcohol/week: 0.0 standard drinks  . Drug use: No    Review of Systems Constitutional: No fever/chills Eyes: No visual changes. ENT: No sore throat. Cardiovascular: Denies chest pain. Respiratory: Denies shortness of breath. Gastrointestinal: No abdominal pain.  No nausea, no vomiting.  No diarrhea.  No constipation.  Increased thirst. Genitourinary: Increased urinary frequency.  Negative for dysuria. Musculoskeletal: Negative for neck pain.  Negative for back pain. Integumentary: Negative for rash. Neurological: Negative for headaches, focal weakness or numbness.   ____________________________________________   PHYSICAL EXAM:  VITAL SIGNS: ED Triage Vitals [03/21/18 2144]  Enc Vitals Group     BP (!) 151/101     Pulse Rate 96     Resp 18     Temp 98.2 F (36.8 C)     Temp Source Oral     SpO2 98 %     Weight 106.6 kg (235 lb)     Height 1.829 m (6')      Head Circumference      Peak Flow      Pain Score 0     Pain Loc      Pain Edu?      Excl. in [place]?     Constitutional: Alert and oriented. Well appearing and in no acute distress. Eyes: Conjunctivae are normal.  Head: Atraumatic. Nose: No congestion/rhinnorhea. Mouth/Throat: Mucous membranes are moist. Neck: No stridor.  No meningeal signs.   Cardiovascular: Normal rate, regular rhythm. Good peripheral circulation. Grossly normal heart sounds. Respiratory: Normal respiratory effort.  No retractions. Lungs CTAB. Gastrointestinal: Soft and nontender. No distention.  Musculoskeletal: No lower extremity tenderness nor edema. No gross deformities of extremities.  Neurologic:  Normal speech and language. No gross focal neurologic deficits are appreciated.  Skin:  Skin is warm, dry and intact. No rash noted. Psychiatric: Mood and affect are normal. Speech and behavior are normal.  ____________________________________________   LABS (all labs ordered are listed, but only abnormal results are displayed)  Labs Reviewed  BASIC METABOLIC PANEL - Abnormal; Notable for the following components:      Result Value   Sodium 133 (*)    Glucose, Bld 520 (*)    All other components within normal limits  URINALYSIS, COMPLETE (UACMP) WITH MICROSCOPIC - Abnormal; Notable for the following components:   Color, Urine COLORLESS (*)    APPearance CLEAR (*)    Glucose, UA >=500 (*)    All other components within normal limits  GLUCOSE, CAPILLARY - Abnormal; Notable for the following components:   Glucose-Capillary 535 (*)    All other components within normal limits  GLUCOSE, CAPILLARY - Abnormal; Notable for the following components:   Glucose-Capillary 493 (*)    All other components within normal limits  GLUCOSE, CAPILLARY - Abnormal; Notable for the following components:   Glucose-Capillary 310 (*)    All other components within normal limits  GLUCOSE, CAPILLARY - Abnormal; Notable for the  following components:   Glucose-Capillary 288 (*)    All other components within normal limits  CBC  CBG MONITORING, ED  CBG MONITORING, ED   ____________________________________________  EKG  No indication for EKG ____________________________________________  RADIOLOGY   ED MD interpretation: No indication for imaging  Official radiology report(s): No results found.  ____________________________________________   PROCEDURES   Procedure(s) performed (including Critical Care):  Procedures   ____________________________________________   INITIAL IMPRESSION / MDM / [place] / ED COURSE  As part of my medical decision making, I reviewed the following data within the [place] notes reviewed and incorporated, Labs reviewed  and Notes from prior ED visits       Differential diagnosis includes, but is not limited to, hyperglycemia without complication, DKA, hyperglycemic hyperosmotic nonketotic syndrome, acute infection.  The patient's lab work is all reassuring except for his hyperglycemia with a normal anion gap and no sign of acute infection nor or metabolic complication of the hyperglycemia.  He is well-appearing and in no distress.  After 1 L of fluid his blood sugar dropped down below 300.  I gave my usual and customary hyperglycemia management recommendations and return precautions and he understands and agrees with the plan for outpatient follow-up.     ____________________________________________  FINAL CLINICAL IMPRESSION(S) / ED DIAGNOSES  Final diagnoses:  Hyperglycemia     MEDICATIONS GIVEN DURING THIS VISIT:  Medications  sodium chloride 0.9 % bolus 500 mL (0 mLs Intravenous Stopped 03/22/18 0248)  sodium chloride 0.9 % bolus 500 mL (0 mLs Intravenous Stopped 03/22/18 0446)     ED Discharge Orders    None       Note:  This document was prepared using Dragon voice recognition software and may include  unintentional dictation errors.   Hinda Kehr, MD 03/22/18 [phone]

## 2018-03-24 ENCOUNTER — Encounter: Payer: Self-pay | Admitting: Family

## 2018-03-24 ENCOUNTER — Telehealth: Payer: Self-pay

## 2018-03-24 NOTE — Telephone Encounter (Signed)
LMTCB for patient to make an appointment due to ED visit & high blood sugars. NEEDS to schedule ASAP!

## 2018-03-24 NOTE — Telephone Encounter (Signed)
Patient called & scheduled Monday 03/29/18.

## 2018-03-29 ENCOUNTER — Other Ambulatory Visit: Payer: Self-pay

## 2018-03-29 ENCOUNTER — Ambulatory Visit: Payer: BLUE CROSS/BLUE SHIELD | Admitting: Family

## 2018-03-29 ENCOUNTER — Encounter: Payer: Self-pay | Admitting: Family

## 2018-03-29 ENCOUNTER — Other Ambulatory Visit: Payer: Self-pay | Admitting: Family

## 2018-03-29 VITALS — BP 110/76 | HR 101 | Temp 98.0°F | Wt 229.0 lb

## 2018-03-29 DIAGNOSIS — J302 Other seasonal allergic rhinitis: Secondary | ICD-10-CM

## 2018-03-29 DIAGNOSIS — E1165 Type 2 diabetes mellitus with hyperglycemia: Secondary | ICD-10-CM

## 2018-03-29 DIAGNOSIS — R197 Diarrhea, unspecified: Secondary | ICD-10-CM | POA: Diagnosis not present

## 2018-03-29 DIAGNOSIS — R252 Cramp and spasm: Secondary | ICD-10-CM | POA: Diagnosis not present

## 2018-03-29 DIAGNOSIS — I10 Essential (primary) hypertension: Secondary | ICD-10-CM | POA: Diagnosis not present

## 2018-03-29 MED ORDER — LIRAGLUTIDE 18 MG/3ML ~~LOC~~ SOPN: 1.8000 mg | PEN_INJECTOR | Freq: Every day | SUBCUTANEOUS | 1 refills | Status: DC

## 2018-03-29 MED ORDER — BASAGLAR KWIKPEN 100 UNIT/ML ~~LOC~~ SOPN: 24.0000 [IU] | PEN_INJECTOR | SUBCUTANEOUS | 3 refills | Status: DC

## 2018-03-29 MED ORDER — METFORMIN HCL ER 500 MG PO TB24: 1000.0000 mg | ORAL_TABLET | Freq: Two times a day (BID) | ORAL | 3 refills | Status: DC

## 2018-03-29 MED ORDER — GLUCOSE BLOOD VI STRP: ORAL_STRIP | 12 refills | Status: DC

## 2018-03-29 MED ORDER — ONETOUCH VERIO W/DEVICE KIT: 1.0000 | PACK | Freq: Every day | 0 refills | Status: AC

## 2018-03-29 NOTE — Patient Instructions (Addendum)
Increase victoza to 1.8mg .   Increase basaglar  1 unit EVERY 3 days until fasting blood sugar approach 150.   Follow up with catie ( pharmacy) in 2 weeks - please make an appointment at front desk.  Trial stop of atorvastatin for one week and see if cramping improves. If improves, you may introduce atorvastatin more slowly.   Hydrate very well as this may be source of cramping.   Trial of metformin XR to see if helps with diarrhea

## 2018-03-29 NOTE — Assessment & Plan Note (Signed)
At goal, continue current regimen 

## 2018-03-29 NOTE — Progress Notes (Signed)
Subjective:    Patient ID: Ronald Pruitt., male    DOB: [birth year], 48 y.o.   MRN: [id number]  CC: Ronald Pruitt. is a 48 y.o. male who presents today for follow up.   HPI:  Overall feels well today. Endorses intermittent blurry vision, polyuria, polyphagia.   Diabetes-post prandial at lunch today was 400.   FBG 228 today. at last office visit Basaglar 24 units between 5-7 am.  Victoza 1.2 mg.   Couldn't take afford janumet, never started. Has seen nutritionist in the past. 2 weeks ago had reading that said 'low', however didn't feel low at that time.  Eats 3 times per day.   Reports watery stool at least once to twice per day, for once for sure in the morning.No affected by meals, particular foods.  No blood in stool. No fever, abdominal pain.    Presented to emergency room February 24 for hypoglycemia, 535 to 288.   HTN- compliant with medications. Denies exertional chest pain or pressure, numbness or tingling radiating to left arm or jaw, palpitations, dizziness, frequent headaches, changes in vision, or shortness of breath.   HLD- compliant with lipitor.   Every night having cramps in calves.     HISTORY:  Past Medical History:  Diagnosis Date  . Arthritis   . BPH (benign prostatic hyperplasia)   . Diabetes ([place])   . Dysuria   . GERD (gastroesophageal reflux disease)   . History of kidney stones   . HTN (hypertension)   . Hyperlipidemia   . Impotence   . Nocturia   . Obesity   . Pneumonia age 62's  . Sleep apnea    CPAP - severe (sleep study 01/11/16)  . Testicular hypofunction   . Wears dentures    full upper and lower   Past Surgical History:  Procedure Laterality Date  . CHOLECYSTECTOMY    . NASAL SEPTOPLASTY W/ TURBINOPLASTY N/A 05/20/2017   Procedure: NASAL SEPTOPLASTY WITH SUBMUCOUS RESECTION;  Surgeon: Clyde Canterbury, MD;  Location: [facility] ORS;  Service: ENT;  Laterality: N/A;  . VASECTOMY     Family History  Problem Relation Age of Onset    . Hyperlipidemia Mother   . Diabetes Mellitus II Mother   . Seizures Mother   . Diabetes Mother   . Mental illness Mother   . Arthritis Mother   . Diabetes Father   . Cirrhosis Father        non alcoholic  . Hyperlipidemia Father   . Arthritis Father   . Liver cancer Father   . Diabetes Maternal Grandmother   . Hyperlipidemia Maternal Grandmother   . Arthritis Maternal Grandmother   . Diabetes Maternal Grandfather   . Hyperlipidemia Maternal Grandfather   . Arthritis Maternal Grandfather   . Diabetes Paternal Grandmother   . Hyperlipidemia Paternal Grandmother   . Arthritis Paternal Grandmother   . Diabetes Paternal Grandfather   . Hyperlipidemia Paternal Grandfather   . Arthritis Paternal Grandfather   . Kidney disease Neg Hx   . Prostate cancer Neg Hx   . Kidney cancer Neg Hx   . Bladder Cancer Neg Hx   . Colon cancer Neg Hx     Allergies: Patient has no known allergies. Current Outpatient Medications on File Prior to Visit  Medication Sig Dispense Refill  . amLODipine (NORVASC) 10 MG tablet Take 1 tablet (10 mg total) by mouth daily. 90 tablet 1  . atorvastatin (LIPITOR) 40 MG tablet Take 1 tablet (40 mg  total) by mouth daily. 90 tablet 1  . EQ ALLERGY RELIEF, CETIRIZINE, 10 MG tablet TAKE 1 TABLET BY MOUTH ONCE DAILY 90 tablet 0  . Insulin Pen Needle (NOVOFINE) 30G X 8 MM MISC Use to inject insulin and Victoza each 1x per day E11.9 180 each 3  . lisinopril (PRINIVIL,ZESTRIL) 10 MG tablet Take 1 tablet (10 mg total) by mouth daily. 90 tablet 1  . omeprazole (PRILOSEC) 20 MG capsule TAKE 1 CAPSULE BY MOUTH EVERY DAY 90 capsule 1  . tadalafil (ADCIRCA/CIALIS) 20 MG tablet Take 1 tablet (20 mg total) by mouth daily as needed for erectile dysfunction. 12 tablet 0   No current facility-administered medications on file prior to visit.     Social History   Tobacco Use  . Smoking status: Current Every Day Smoker    Packs/day: 0.50    Years: 20.00    Pack years: 10.00     Types: Cigarettes  . Smokeless tobacco: Never Used  Substance Use Topics  . Alcohol use: No    Alcohol/week: 0.0 standard drinks  . Drug use: No    Review of Systems  Constitutional: Negative for chills and fever.  Respiratory: Negative for cough.   Cardiovascular: Negative for chest pain and palpitations.  Gastrointestinal: Positive for diarrhea. Negative for abdominal distention, abdominal pain, blood in stool, constipation, nausea and vomiting.  Endocrine: Positive for polydipsia, polyphagia and polyuria.  Genitourinary: Negative for difficulty urinating and dysuria.      Objective:    BP 110/76 (BP Location: Left Arm, Patient Position: Sitting, Cuff Size: Large)   Pulse (!) 101   Temp 98 F (36.7 C)   Wt 229 lb (103.9 kg)   SpO2 96%   BMI 31.06 kg/m  BP Readings from Last 3 Encounters:  03/29/18 110/76  03/22/18 (!) 144/97  12/08/17 115/81   Wt Readings from Last 3 Encounters:  03/29/18 229 lb (103.9 kg)  03/21/18 235 lb (106.6 kg)  12/08/17 232 lb 9.6 oz (105.5 kg)    Physical Exam Vitals signs reviewed.  Constitutional:      Appearance: He is well-developed.  Cardiovascular:     Rate and Rhythm: Regular rhythm.     Heart sounds: Normal heart sounds.  Pulmonary:     Effort: Pulmonary effort is normal. No respiratory distress.     Breath sounds: Normal breath sounds. No wheezing, rhonchi or rales.  Skin:    General: Skin is warm and dry.  Neurological:     Mental Status: He is alert.  Psychiatric:        Speech: Speech normal.        Behavior: Behavior normal.        Assessment & Plan:   Problem List Items Addressed This Visit      Cardiovascular and Mediastinum   HTN (hypertension)    At goal, continue current regimen        Endocrine   DM (diabetes mellitus) ([place]) - Primary    Increase Basaglar 1 unit every 3 days until fasting depression 150.  Increase Victoza.  Patient will see Catie, pharmacist for further titration.  Patient remain  vigilant for any episodes of hypoglycemia.  Suspect one episode reported may be erroneous that sugars have been consistently high.  We will do A1c at next visit        Other   Diarrhea    One episode most days, up to 2 episodes.  Suspect increase metformin may be playing a role.  Will  do trial of extended release metformin to see if mitigates suspect a side effect.  Patient will maintain close vigilance.      Relevant Orders   Ambulatory referral to Gastroenterology   Gastrointestinal Pathogen Panel PCR   Muscle cramping    Suspect dehydration playing role. Trial stop of lipitor. Patient will let me know how he is doing.          I have discontinued Camran Keady. Faries Jr.'s BASAGLAR KWIKPEN, liraglutide, and metFORMIN. I am also having him maintain his Insulin Pen Needle, amLODipine, omeprazole, atorvastatin, lisinopril, EQ ALLERGY RELIEF (CETIRIZINE), and tadalafil.   Meds ordered this encounter  Medications  . DISCONTD: Insulin Glargine (BASAGLAR KWIKPEN) 100 UNIT/ML SOPN    Sig: Inject 0.24 mLs (24 Units total) into the skin every morning. With food    Dispense:  3 pen    Refill:  3    Substitution ok    Order Specific Question:   Supervising Provider    Answer:   Derrel Nip, TERESA L [[id number]]  . DISCONTD: liraglutide (VICTOZA) 18 MG/3ML SOPN    Sig: Inject 0.3 mLs (1.8 mg total) into the skin daily.    Dispense:  5 pen    Refill:  1    Order Specific Question:   Supervising Provider    Answer:   Derrel Nip, TERESA L [[id number]]  . DISCONTD: metFORMIN (GLUCOPHAGE XR) 500 MG 24 hr tablet    Sig: Take 2 tablets (1,000 mg total) by mouth 2 (two) times daily.    Dispense:  90 tablet    Refill:  3    Order Specific Question:   Supervising Provider    Answer:   Crecencio Mc [[id number]]    Return precautions given.   Risks, benefits, and alternatives of the medications and treatment plan prescribed today were discussed, and patient expressed understanding.   Education regarding symptom  management and diagnosis given to patient on AVS.  Continue to follow with Burnard Hawthorne, FNP for routine health maintenance.   Ronald Pruitt. and I agreed with plan.   Mable Paris, FNP

## 2018-03-30 NOTE — Telephone Encounter (Signed)
Last OV  03/29/2018    Rx not listed on pt's medication list

## 2018-03-31 DIAGNOSIS — R252 Cramp and spasm: Secondary | ICD-10-CM | POA: Insufficient documentation

## 2018-03-31 DIAGNOSIS — R197 Diarrhea, unspecified: Secondary | ICD-10-CM | POA: Insufficient documentation

## 2018-03-31 MED ORDER — CETIRIZINE HCL 10 MG PO TABS: 10.0000 mg | ORAL_TABLET | Freq: Every day | ORAL | 1 refills | Status: DC

## 2018-03-31 NOTE — Assessment & Plan Note (Signed)
Increase Basaglar 1 unit every 3 days until fasting depression 150.  Increase Victoza.  Patient will see Catie, pharmacist for further titration.  Patient remain vigilant for any episodes of hypoglycemia.  Suspect one episode reported may be erroneous that sugars have been consistently high.  We will do A1c at next visit

## 2018-03-31 NOTE — Assessment & Plan Note (Signed)
One episode most days, up to 2 episodes.  Suspect increase metformin may be playing a role.  Will do trial of extended release metformin to see if mitigates suspect a side effect.  Patient will maintain close vigilance.

## 2018-03-31 NOTE — Assessment & Plan Note (Signed)
Suspect dehydration playing role. Trial stop of lipitor. Patient will let me know how he is doing.

## 2018-04-12 ENCOUNTER — Ambulatory Visit: Payer: BLUE CROSS/BLUE SHIELD | Admitting: Family

## 2018-04-12 ENCOUNTER — Other Ambulatory Visit: Payer: Self-pay

## 2018-04-12 ENCOUNTER — Encounter: Payer: Self-pay | Admitting: Family

## 2018-04-12 VITALS — BP 116/72 | HR 98 | Temp 97.9°F | Wt 226.6 lb

## 2018-04-12 DIAGNOSIS — E119 Type 2 diabetes mellitus without complications: Secondary | ICD-10-CM | POA: Diagnosis not present

## 2018-04-12 DIAGNOSIS — I10 Essential (primary) hypertension: Secondary | ICD-10-CM | POA: Diagnosis not present

## 2018-04-12 NOTE — Assessment & Plan Note (Signed)
Fasting blood sugars are approaching 150.  Patient will maintain on 32 units of Basaglar at this time.  He is on max dose Victoza.  We will start increasing metformin to max dose of 1000 twice per day.  Pending A1c. Emphasized the importance of  eye exam.  Patient verbalized understanding of this and will make an appointment

## 2018-04-12 NOTE — Progress Notes (Signed)
Subjective:    Patient ID: Ronald Pruitt., male    DOB: 02/23/1970, 48 y.o.   MRN: [id number]  CC: [name]. is a 48 y.o. male who presents today for follow up.   HPI: Feels well today, no new complaints  DM- Blood sugars are coming down. on 32 basaglar. FBG  179, 188, 176, 159.Pre pandial - 132. After lunch today, 3 hours after 200.  no hypoglycemia.  Metformin XR 500 bid. Limiting sweet tea, no longer drinking sodas.  Diarrhea is better, is much less frequent.   Vision is still blurry , wears bifocals. hasnt changed with improvement of blood glucose. Having HAs with trying to focus. HA's are not severe.   On lipitor.   HTN-compliant medication.  No chest pain     HISTORY:  Past Medical History:  Diagnosis Date  . Arthritis   . BPH (benign prostatic hyperplasia)   . Diabetes ([place])   . Dysuria   . GERD (gastroesophageal reflux disease)   . History of kidney stones   . HTN (hypertension)   . Hyperlipidemia   . Impotence   . Nocturia   . Obesity   . Pneumonia age 66's  . Sleep apnea    CPAP - severe (sleep study 01/11/16)  . Testicular hypofunction   . Wears dentures    full upper and lower   Past Surgical History:  Procedure Laterality Date  . CHOLECYSTECTOMY    . NASAL SEPTOPLASTY W/ TURBINOPLASTY N/A 05/20/2017   Procedure: NASAL SEPTOPLASTY WITH SUBMUCOUS RESECTION;  Surgeon: Clyde Canterbury, MD;  Location: [facility] ORS;  Service: ENT;  Laterality: N/A;  . VASECTOMY     Family History  Problem Relation Age of Onset  . Hyperlipidemia Mother   . Diabetes Mellitus II Mother   . Seizures Mother   . Diabetes Mother   . Mental illness Mother   . Arthritis Mother   . Diabetes Father   . Cirrhosis Father        non alcoholic  . Hyperlipidemia Father   . Arthritis Father   . Liver cancer Father   . Diabetes Maternal Grandmother   . Hyperlipidemia Maternal Grandmother   . Arthritis Maternal Grandmother   . Diabetes Maternal Grandfather   .  Hyperlipidemia Maternal Grandfather   . Arthritis Maternal Grandfather   . Diabetes Paternal Grandmother   . Hyperlipidemia Paternal Grandmother   . Arthritis Paternal Grandmother   . Diabetes Paternal Grandfather   . Hyperlipidemia Paternal Grandfather   . Arthritis Paternal Grandfather   . Kidney disease Neg Hx   . Prostate cancer Neg Hx   . Kidney cancer Neg Hx   . Bladder Cancer Neg Hx   . Colon cancer Neg Hx     Allergies: Patient has no known allergies. Current Outpatient Medications on File Prior to Visit  Medication Sig Dispense Refill  . amLODipine (NORVASC) 10 MG tablet Take 1 tablet (10 mg total) by mouth daily. 90 tablet 1  . atorvastatin (LIPITOR) 40 MG tablet Take 1 tablet (40 mg total) by mouth daily. 90 tablet 1  . Blood Glucose Monitoring Suppl (ONETOUCH VERIO) w/Device KIT 1 Device by Does not apply route daily. 1 kit 0  . cetirizine (EQ ALLERGY RELIEF, CETIRIZINE,) 10 MG tablet Take 1 tablet (10 mg total) by mouth daily. 90 tablet 1  . glucose blood (ONETOUCH VERIO) test strip Used to check blood sugars twice daily. 100 each 12  . Insulin Glargine (BASAGLAR KWIKPEN) 100  UNIT/ML SOPN Inject 0.24 mLs (24 Units total) into the skin every morning. With food (Patient taking differently: Inject 32 Units into the skin every morning. With food) 3 pen 3  . Insulin Pen Needle (NOVOFINE) 30G X 8 MM MISC Use to inject insulin and Victoza each 1x per day E11.9 180 each 3  . liraglutide (VICTOZA) 18 MG/3ML SOPN Inject 0.3 mLs (1.8 mg total) into the skin daily. 5 pen 1  . lisinopril (PRINIVIL,ZESTRIL) 10 MG tablet Take 1 tablet (10 mg total) by mouth daily. 90 tablet 1  . metFORMIN (GLUCOPHAGE XR) 500 MG 24 hr tablet Take 2 tablets (1,000 mg total) by mouth 2 (two) times daily. 90 tablet 3  . omeprazole (PRILOSEC) 20 MG capsule TAKE 1 CAPSULE BY MOUTH EVERY DAY 90 capsule 1  . tadalafil (ADCIRCA/CIALIS) 20 MG tablet Take 1 tablet (20 mg total) by mouth daily as needed for erectile  dysfunction. 12 tablet 0   No current facility-administered medications on file prior to visit.     Social History   Tobacco Use  . Smoking status: Current Every Day Smoker    Packs/day: 0.50    Years: 20.00    Pack years: 10.00    Types: Cigarettes  . Smokeless tobacco: Never Used  Substance Use Topics  . Alcohol use: No    Alcohol/week: 0.0 standard drinks  . Drug use: No    Review of Systems  Constitutional: Negative for chills and fever.  Eyes: Positive for visual disturbance.  Respiratory: Negative for cough.   Cardiovascular: Negative for chest pain and palpitations.  Gastrointestinal: Negative for nausea and vomiting.  Endocrine: Negative for polydipsia, polyphagia and polyuria.  Neurological: Positive for headaches.      Objective:    BP 116/72 (BP Location: Left Arm, Patient Position: Sitting, Cuff Size: Large)   Pulse 98   Temp 97.9 F (36.6 C)   Wt 226 lb 9.6 oz (102.8 kg)   SpO2 99%   BMI 30.73 kg/m  BP Readings from Last 3 Encounters:  04/12/18 116/72  03/29/18 110/76  03/22/18 (!) 144/97   Wt Readings from Last 3 Encounters:  04/12/18 226 lb 9.6 oz (102.8 kg)  03/29/18 229 lb (103.9 kg)  03/21/18 235 lb (106.6 kg)    Physical Exam Vitals signs reviewed.  Constitutional:      Appearance: He is well-developed.  Cardiovascular:     Rate and Rhythm: Regular rhythm.     Heart sounds: Normal heart sounds.  Pulmonary:     Effort: Pulmonary effort is normal. No respiratory distress.     Breath sounds: Normal breath sounds. No wheezing, rhonchi or rales.  Skin:    General: Skin is warm and dry.  Neurological:     Mental Status: He is alert.  Psychiatric:        Speech: Speech normal.        Behavior: Behavior normal.        Assessment & Plan:   Problem List Items Addressed This Visit      Cardiovascular and Mediastinum   HTN (hypertension)    At goal, continue regimen      Relevant Orders   Comprehensive metabolic panel      Endocrine   DM (diabetes mellitus) ([place]) - Primary    Fasting blood sugars are approaching 150.  Patient will maintain on 32 units of Basaglar at this time.  He is on max dose Victoza.  We will start increasing metformin to max dose of 1000  twice per day.  Pending A1c. Emphasized the importance of  eye exam.  Patient verbalized understanding of this and will make an appointment      Relevant Orders   Hemoglobin A1c       I am having Okey Regal. Patsy [place]. maintain his Insulin Pen Needle, amLODipine, omeprazole, atorvastatin, lisinopril, tadalafil, liraglutide, metFORMIN, Basaglar KwikPen, OneTouch Verio, glucose blood, and cetirizine.   No orders of the defined types were placed in this encounter.   Return precautions given.   Risks, benefits, and alternatives of the medications and treatment plan prescribed today were discussed, and patient expressed understanding.   Education regarding symptom management and diagnosis given to patient on AVS.  Continue to follow with Burnard Hawthorne, FNP for routine health maintenance.   Ronald Pruitt. and I agreed with plan.   Mable Paris, FNP

## 2018-04-12 NOTE — Patient Instructions (Addendum)
May slowly work towards taking metformin extended release 1000 mg in the morning and at night.  Some patients opt to take the full dose of 2000 mg at bedtime altogether however my concern is your diarrhea may return.  You may slowly move from the 500 mg in the morning and at night  to 1000 mg in the morning at night.  Stay vigilant for any signs of hypoglycemia.  You may stay on the current dose of long-acting insulin of 32 units at this time.  Please ensure you make a follow-up with your eye doctor, this is something I would do in the next couple weeks.  Let me know if you anything at all.

## 2018-04-12 NOTE — Assessment & Plan Note (Signed)
At goal, continue regimen. 

## 2018-04-13 LAB — COMPREHENSIVE METABOLIC PANEL
ALT: 24 U/L (ref 0–53)
AST: 17 U/L (ref 0–37)
Albumin: 4.1 g/dL (ref 3.5–5.2)
Alkaline Phosphatase: 77 U/L (ref 39–117)
BUN: 16 mg/dL (ref 6–23)
CO2: 28 meq/L (ref 19–32)
Calcium: 9.1 mg/dL (ref 8.4–10.5)
Chloride: 106 mEq/L (ref 96–112)
Creatinine, Ser: 1.03 mg/dL (ref 0.40–1.50)
GFR: 76.49 mL/min (ref >60.00)
Glucose, Bld: 202 mg/dL — ABNORMAL HIGH (ref 70–99)
Potassium: 3.9 mEq/L (ref 3.5–5.1)
Sodium: 140 mEq/L (ref 135–145)
Total Bilirubin: 0.4 mg/dL (ref 0.2–1.2)
Total Protein: 6.7 g/dL (ref 6.0–8.3)

## 2018-04-13 LAB — HEMOGLOBIN A1C: Hgb A1c MFr Bld: 11.9 % — ABNORMAL HIGH (ref 4.6–6.5)

## 2018-04-15 IMAGING — CR DG ABDOMEN 1V
1 series · 2 of 2 positions shown · non-contrast
Comparison: None in PACs

CLINICAL DATA: Follow-up kidney stones.  No current complaints.

EXAM:
ABDOMEN - 1 VIEW

[Series 1: dg abd 1 view · 0.14mm/px · 2 of 2 slices shown]
[im 1/2]
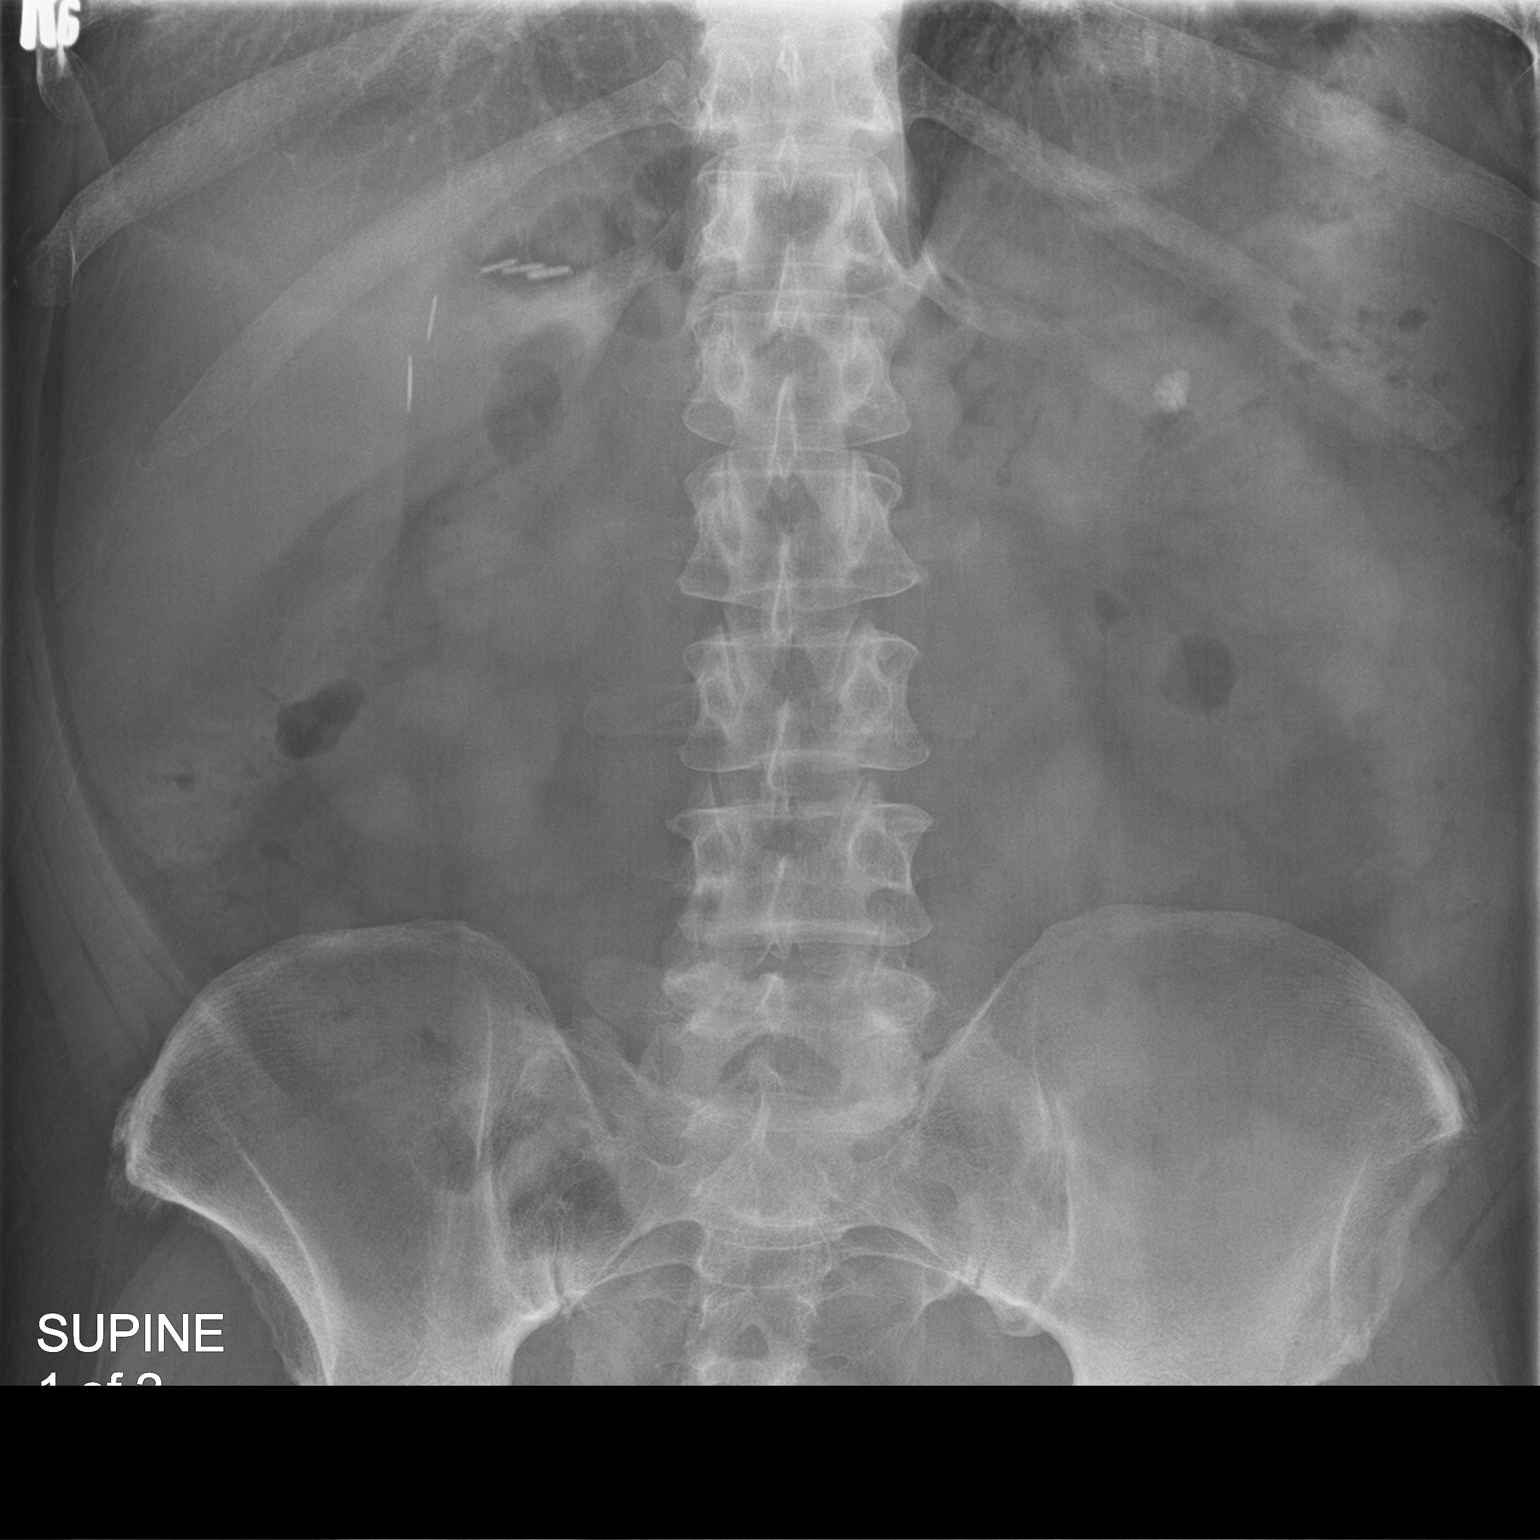
[im 2/2]
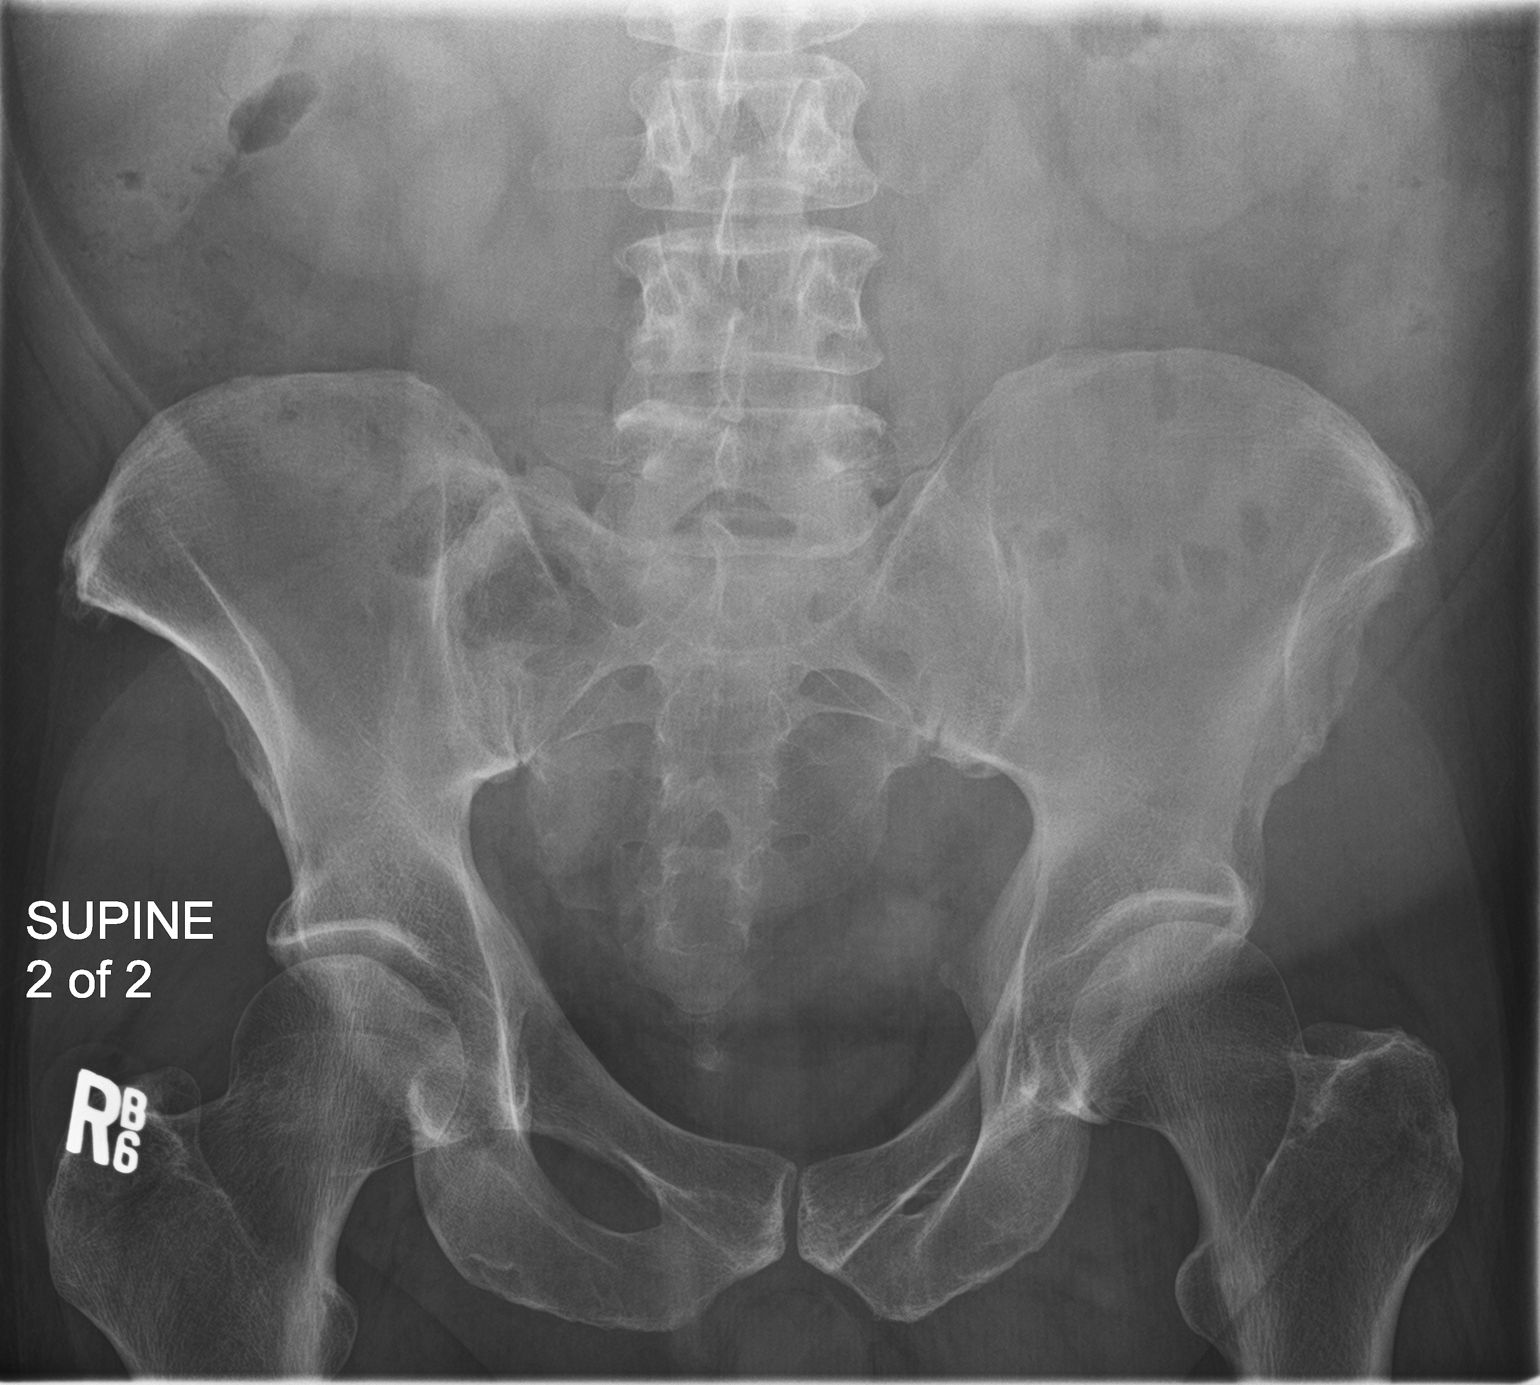

[2 of 2 positions shown; findings below may reference images not displayed]

FINDINGS: There is a coarse calcification that projects over the upper pole of
the left kidney. This may lie within the cortex or within the renal
collecting system. No other left-sided kidney stones are observed.
No right-sided kidney stones are observed either. No ureteral or
bladder stones are demonstrated. The bowel gas pattern is normal.
There surgical clips in the gallbladder fossa. The bony structures
are unremarkable.
IMPRESSION: Coarse approximately 10 x 12 mm calcification projecting over the
upper pole of the left kidney may reflect a parenchymal
calcification or a stone.

## 2018-04-26 ENCOUNTER — Ambulatory Visit: Payer: BLUE CROSS/BLUE SHIELD | Admitting: Pharmacist

## 2018-04-26 ENCOUNTER — Telehealth: Payer: Self-pay | Admitting: Pharmacist

## 2018-04-26 DIAGNOSIS — E1165 Type 2 diabetes mellitus with hyperglycemia: Secondary | ICD-10-CM

## 2018-04-26 DIAGNOSIS — E119 Type 2 diabetes mellitus without complications: Secondary | ICD-10-CM

## 2018-04-26 MED ORDER — BASAGLAR KWIKPEN 100 UNIT/ML ~~LOC~~ SOPN: 44.0000 [IU] | PEN_INJECTOR | Freq: Every day | SUBCUTANEOUS | 2 refills | Status: DC

## 2018-04-26 MED ORDER — GLUCOSE BLOOD VI STRP: ORAL_STRIP | 3 refills | Status: DC

## 2018-04-26 NOTE — Telephone Encounter (Addendum)
S:     Chief Complaint  Patient presents with  . Medication Management    Diabetes    Patient contacted telephonically for diabetes evaluation, education, and management at the request of Joycelyn Schmid Arnett/Dr. Derrel Nip (office visit on 03/29/2018). Last seen by primary care provider on 04/12/2018 - at that time, Basaglar was further adjusted.   Today, he reports significant improvement in polyuria/nocturia and polydipsia.  Insurance coverage/medication affordability: Pharmacologist  Patient reports adherence with medications.  Current diabetes medications include: metformin XR 1000 mg BID, Victoza 1.8 mg daily, Basaglar 40 units QAM - Notes hx yeast infection and significant urinary complaints with Jardiance (02/2017), though A1c/BG elevated at that time Current hypertension medications include: lisinopril 10 mg daily, amlodipine 10 mg daily Current hyperlipidemic medications include: atorvastatin 40 mg daily  Patient reports hypoglycemic s/sx with BG <100 including shakiness, sweating.  Patient reported dietary habits: Eats 3 meals/day Breakfast: Biscuit w/ sausage and eg Lunch: Often eats out; chicken sandwich, hamburger, small order of fries Dinner: Homecooked meals; sal[place] steak, hamburgers, hot dogs Snacks: Very infrequently; keeps nabs at work for episodes of low blood sugar Drinks: Water; switched from tea recently  Patient reported exercise habits: Notes that his job requires walking all day, so doesn't exercise during the week. Has started walking with his mother on weekends for short distances  O:   Lab Results  Component Value Date   HGBA1C 11.9 (H) 74/14/2395   Basic Metabolic Panel BMP Latest Ref Rng & Units 04/12/2018 03/21/2018 09/30/2017  Glucose 70 - 99 mg/dL 202(H) 520(HH) 298(H)  BUN 6 - 23 mg/dL 16 11 12   Creatinine 0.40 - 1.50 mg/dL 1.03 0.78 0.97  BUN/Creat Ratio 6 - 22 (calc) - - -  Sodium 135 - 145 mEq/L 140 133(L) 138  Potassium 3.5 - 5.1 mEq/L 3.9 4.2 4.0   Chloride 96 - 112 mEq/L 106 100 104  CO2 19 - 32 mEq/L 28 22 25   Calcium 8.4 - 10.5 mg/dL 9.1 9.2 9.1   Self-Monitored Blood Glucose Results Fasting SMBG: 110s-190s; more so >150 Pre lunch: 110-120s 2 hour post-prandial SMBG: 110-130s  A/P: #Diabetes - Currently uncontrolled though improved per SMBG results, most recent A1c 11.9% does not reflect recent improved control; Goal A1c <7%. Discussed SGLT2, though d/t hx UTI/yeast infection with this medication previously, will wait until diabetes has been better controlled to consider adding on SGLT2 - Increase Basaglar to 44 units daily - Continue metformin XR 1000 mg BID and Victoza 1.8 mg daily - Encouraged patient to continue to check fasting BG, but add 2 hour post prandial checks, alternating between meals. Will send updated test strip prescription    #ASCVD risk - primary prevention in patient aged 35-75 with DM; ASCVD risk unknown due to not having lipid panel on file.  - Continue atorvastatin 40 mg daily - Check fasting lipid panel in the future    Follow up in Pharmacist Clinic Visit 4 weeks.  De Hollingshead, PharmD, [place] PGY2 Ambulatory Care Pharmacy Resident Phone: [phone]

## 2018-04-26 NOTE — Addendum Note (Signed)
Addended by: De Hollingshead on: 04/26/2018 12:18 PM   Modules accepted: Orders

## 2018-04-26 NOTE — Telephone Encounter (Signed)
Agree with plan. Keaton Stirewalt, NP  

## 2018-04-28 ENCOUNTER — Telehealth: Payer: Self-pay | Admitting: Family Medicine

## 2018-04-28 NOTE — Telephone Encounter (Signed)
Patient needs 3 month follow up appt scheduled for diabetes and BP management.   Last seen here March 2020.   Also due for:  Foot exam  Pneumonia vaccine

## 2018-04-30 NOTE — Telephone Encounter (Signed)
I called patient, but received no answer. It appears he has an appointment with any primary care provider as well.

## 2018-05-03 NOTE — Telephone Encounter (Signed)
LMTCB

## 2018-05-10 ENCOUNTER — Ambulatory Visit: Payer: BLUE CROSS/BLUE SHIELD | Admitting: Family Medicine

## 2018-05-11 ENCOUNTER — Other Ambulatory Visit: Payer: Self-pay | Admitting: Family

## 2018-05-11 DIAGNOSIS — K219 Gastro-esophageal reflux disease without esophagitis: Secondary | ICD-10-CM

## 2018-05-11 DIAGNOSIS — I10 Essential (primary) hypertension: Secondary | ICD-10-CM

## 2018-05-24 ENCOUNTER — Encounter: Payer: Self-pay | Admitting: *Deleted

## 2018-05-24 ENCOUNTER — Telehealth: Payer: Self-pay | Admitting: Pharmacist

## 2018-05-24 ENCOUNTER — Ambulatory Visit: Payer: BLUE CROSS/BLUE SHIELD | Admitting: Pharmacist

## 2018-05-24 NOTE — Telephone Encounter (Signed)
Reviewed above information.  Agree with assessment and plan.

## 2018-05-24 NOTE — Telephone Encounter (Signed)
Dr. Arihanna Estabrook

## 2018-05-24 NOTE — Telephone Encounter (Signed)
S:     Chief Complaint  Patient presents with  . Medication Management    Patient contacted telephonically for diabetes evaluation, education, and management at the request of Joycelyn Schmid Arnett/Dr. Derrel Nip (referred on office visit on 03/29/2018). Last seen by primary care provider on 04/12/2018. Last spoke with me 04/26/2018 - at that time, Basaglar was increased.   Today, he notes that he had a toe injury at work, but that it is healing well. He confirms that he has been following up with a provider for appropriate antibiotic treatment and monitoring, and that it is healing well.   He is very pleased by the improvement in his blood sugars. He denies any complaints with current therapy.    Insurance coverage/medication affordability: Pharmacologist - Utilizes coupon cards for Victoza/Basaglar   Patient reports adherence with medications.  Current diabetes medications include: metformin XR 1000 mg BID, Victoza 1.8 mg once daily, Basaglar 44 units daily  Patient reports 1 episode of  hypoglycemic s/sx including dizziness, shakiness, sweating with BG 83. Patient reports improvement in hyperglycemic symptoms.   O:  Lab Results  Component Value Date   HGBA1C 11.9 (H) 37/10/6267   Basic Metabolic Panel BMP Latest Ref Rng & Units 04/12/2018 03/21/2018 09/30/2017  Glucose 70 - 99 mg/dL 202(H) 520(HH) 298(H)  BUN 6 - 23 mg/dL 16 11 12   Creatinine 0.40 - 1.50 mg/dL 1.03 0.78 0.97  BUN/Creat Ratio 6 - 22 (calc) - - -  Sodium 135 - 145 mEq/L 140 133(L) 138  Potassium 3.5 - 5.1 mEq/L 3.9 4.2 4.0  Chloride 96 - 112 mEq/L 106 100 104  CO2 19 - 32 mEq/L 28 22 25   Calcium 8.4 - 10.5 mg/dL 9.1 9.2 9.1   Self-Monitored Blood Glucose Results Fasting SMBG: 100-150s, mostly <120  2 hour post-prandial/random SMBG: 150-185 - Reports one episode of BG 83 before lunch  A/P: #Diabetes - Currently uncontrolled but significantly improved per SMBG results, most recent A1c 11.1% does not reflect current BG  control. Goal A1c <7%. Hx genitourinary infections with SGLT2, but blood sugar was significantly elevated at that time.  - Educated patient that symptoms of hypoglycemia with "normal" blood sugar levels is to be expected, given how uncontrolled he was previously.  - Continue metformin XR 1000 mg BID, Victoza 1.8 mg daily, Basaglar 44 units daily. Recommend continuing this regimen for ~2 more months, evaluate next A1c, and consider re-addition of SGLT2 for ASCVD and nephroprotective benefits, and to decrease required insulin burden - Next A1C anticipated 06/2018  Will route to PCP Mable Paris, NP to determine when she wants to f/u with patient and check lab work (A1c, BMP, recommend lipid panel as well)    Follow up in Pharmacist Clinic Visit as needed.  De Hollingshead, PharmD, [place] PGY2 Ambulatory Care Pharmacy Resident Phone: [phone]

## 2018-05-26 NOTE — Telephone Encounter (Signed)
Noted  I agree as well with plan Mable Paris, NP

## 2018-06-01 ENCOUNTER — Encounter: Payer: Self-pay | Admitting: Gastroenterology

## 2018-06-01 ENCOUNTER — Ambulatory Visit: Payer: BLUE CROSS/BLUE SHIELD | Admitting: Gastroenterology

## 2018-06-01 ENCOUNTER — Ambulatory Visit (INDEPENDENT_AMBULATORY_CARE_PROVIDER_SITE_OTHER): Payer: BLUE CROSS/BLUE SHIELD | Admitting: Gastroenterology

## 2018-06-01 ENCOUNTER — Other Ambulatory Visit: Payer: Self-pay

## 2018-06-01 DIAGNOSIS — R197 Diarrhea, unspecified: Secondary | ICD-10-CM

## 2018-06-01 DIAGNOSIS — K219 Gastro-esophageal reflux disease without esophagitis: Secondary | ICD-10-CM

## 2018-06-01 MED ORDER — OMEPRAZOLE 20 MG PO CPDR: DELAYED_RELEASE_CAPSULE | ORAL | 1 refills | Status: DC

## 2018-06-01 NOTE — Progress Notes (Signed)
Ronald Lame, MD [address]  [place]  [place], [place] [address]  Main: [phone]  Fax: [phone]    Gastroenterology Virtual/Video Visit  Referring Provider:     Burnard Hawthorne, FNP Primary Care Physician:  Burnard Hawthorne, FNP Primary Gastroenterologist:  Dr.Valisha Heslin Allen Norris Reason for Consultation:     Diarrhea and weight loss        HPI:    Virtual Visit via Video Note Location of the patient: Home Location of provider: Office  Participating persons: The patient myself and Ronald Pruitt.  I connected with Ronald Pruitt. on 06/01/18 at 10:00 AM EDT by a video enabled telemedicine application and verified that I am speaking with the correct person using two identifiers.   I discussed the limitations of evaluation and management by telemedicine and the availability of in person appointments. The patient expressed understanding and agreed to proceed.  Verbal consent to proceed obtained.  History of Present Illness: Ronald Pruitt. is a 47 y.o. male referred by Dr. Burnard Hawthorne, FNP  for consultation & management of diarrhea and weight loss.  The patient reports that he been having diarrhea for last few months.  The patient also reports that he is lost about 10 to 15 pounds of the last few months also.  The patient thought it may be related to some of his medications but he states he had the diarrhea before he started on 8 these medications.  There is no report of any black stools or bloody stools.  The patient does report that he has had urgency in the morning and usually has 1 soft bowel movement during the day and then 2 further bowel movements throughout the day that are always watery or soft.  He reports that only on very seldom occasions he will have normal bowel movements.  He denies any milk or dairy products except for a small amount of milk in his coffee in the morning.  The patient denies ever having a colonoscopy the past there is no  family history of colon cancer or colon polyps.  The patient is now concerned because the diarrhea has continued.  He also reports that he cannot associate with any sort of food that he eats or drinks.  Past Medical History:  Diagnosis Date   Arthritis    BPH (benign prostatic hyperplasia)    Diabetes (HCC)    Dysuria    GERD (gastroesophageal reflux disease)    History of kidney stones    HTN (hypertension)    Hyperlipidemia    Impotence    Nocturia    Obesity    Pneumonia age 88's   Sleep apnea    CPAP - severe (sleep study 01/11/16)   Testicular hypofunction    Wears dentures    full upper and lower    Past Surgical History:  Procedure Laterality Date   CHOLECYSTECTOMY     NASAL SEPTOPLASTY W/ TURBINOPLASTY N/A 05/20/2017   Procedure: NASAL SEPTOPLASTY WITH SUBMUCOUS RESECTION;  Surgeon: Clyde Canterbury, MD;  Location: [facility] ORS;  Service: ENT;  Laterality: N/A;   VASECTOMY      Prior to Admission medications   Medication Sig Start Date End Date Taking? Authorizing Provider  amLODipine (NORVASC) 10 MG tablet Take 1 tablet (10 mg total) by mouth daily. 09/21/17   Burnard Hawthorne, FNP  atorvastatin (LIPITOR) 40 MG tablet Take 1 tablet (40 mg total) by mouth daily. 09/21/17   Burnard Hawthorne, FNP  Blood Glucose Monitoring Suppl (ONETOUCH VERIO) w/Device KIT 1 Device by Does not apply route daily. 03/29/18   Burnard Hawthorne, FNP  cetirizine (EQ ALLERGY RELIEF, CETIRIZINE,) 10 MG tablet Take 1 tablet (10 mg total) by mouth daily. Patient not taking: Reported on 04/26/2018 03/31/18   Burnard Hawthorne, FNP  glucose blood test strip Use to check blood sugar up to 3 times daily 04/26/18   Crecencio Mc, MD  Insulin Glargine (BASAGLAR KWIKPEN) 100 UNIT/ML SOPN Inject 0.44 mLs (44 Units total) into the skin daily. Increase as instructed. Max daily dose 60 units daily 04/26/18   Crecencio Mc, MD  Insulin Pen Needle (NOVOFINE) 30G X 8 MM MISC Use to inject insulin  and Victoza each 1x per day E11.9 03/15/17   McLean-Scocuzza, Nino Glow, MD  liraglutide (VICTOZA) 18 MG/3ML SOPN Inject 0.3 mLs (1.8 mg total) into the skin daily. 03/29/18   Burnard Hawthorne, FNP  lisinopril (PRINIVIL,ZESTRIL) 10 MG tablet Take 1 tablet by mouth once daily 05/12/18   Burnard Hawthorne, FNP  metFORMIN (GLUCOPHAGE XR) 500 MG 24 hr tablet Take 2 tablets (1,000 mg total) by mouth 2 (two) times daily. 03/29/18   Burnard Hawthorne, FNP  omeprazole (PRILOSEC) 20 MG capsule Take 1 capsule by mouth once daily 05/12/18   Burnard Hawthorne, FNP  tadalafil (ADCIRCA/CIALIS) 20 MG tablet Take 1 tablet (20 mg total) by mouth daily as needed for erectile dysfunction. 03/16/18   Nori Riis, PA-C    Family History  Problem Relation Age of Onset   Hyperlipidemia Mother    Diabetes Mellitus II Mother    Seizures Mother    Diabetes Mother    Mental illness Mother    Arthritis Mother    Diabetes Father    Cirrhosis Father        non alcoholic   Hyperlipidemia Father    Arthritis Father    Liver cancer Father    Diabetes Maternal Grandmother    Hyperlipidemia Maternal Grandmother    Arthritis Maternal Grandmother    Diabetes Maternal Grandfather    Hyperlipidemia Maternal Grandfather    Arthritis Maternal Grandfather    Diabetes Paternal Grandmother    Hyperlipidemia Paternal Grandmother    Arthritis Paternal Grandmother    Diabetes Paternal Grandfather    Hyperlipidemia Paternal Grandfather    Arthritis Paternal Grandfather    Kidney disease Neg Hx    Prostate cancer Neg Hx    Kidney cancer Neg Hx    Bladder Cancer Neg Hx    Colon cancer Neg Hx      Social History   Tobacco Use   Smoking status: Current Every Day Smoker    Packs/day: 0.50    Years: 20.00    Pack years: 10.00    Types: Cigarettes   Smokeless tobacco: Never Used  Substance Use Topics   Alcohol use: No    Alcohol/week: 0.0 standard drinks   Drug use: No    Allergies  as of 06/01/2018   (No Known Allergies)    Review of Systems:    All systems reviewed and negative except where noted in HPI.   Observations/Objective:  Labs: CBC    Component Value Date/Time   WBC 6.6 03/21/2018 2159   RBC 5.23 03/21/2018 2159   HGB 15.4 03/21/2018 2159   HGB 13.6 07/01/2017 0830   HCT 45.9 03/21/2018 2159   HCT 43.3 07/01/2017 0830   PLT 169 03/21/2018 2159   PLT 181 05/02/2014 1559  MCV 87.8 03/21/2018 2159   MCV 87 05/02/2014 1559   MCH 29.4 03/21/2018 2159   MCHC 33.6 03/21/2018 2159   RDW 13.2 03/21/2018 2159   RDW 14.0 05/02/2014 1559   LYMPHSABS 1.0 12/01/2017 1006   LYMPHSABS 1.6 05/02/2014 1559   MONOABS 0.4 12/01/2017 1006   MONOABS 0.4 05/02/2014 1559   EOSABS 0.2 12/01/2017 1006   EOSABS 0.2 05/02/2014 1559   BASOSABS 0.1 12/01/2017 1006   BASOSABS 0.3 (H) 05/02/2014 1559   CMP     Component Value Date/Time   NA 140 04/12/2018 1637   NA 140 10/15/2016 0900   NA 137 05/11/2012 0434   K 3.9 04/12/2018 1637   K 4.2 05/11/2012 0434   CL 106 04/12/2018 1637   CL 106 05/11/2012 0434   CO2 28 04/12/2018 1637   CO2 28 05/11/2012 0434   GLUCOSE 202 (H) 04/12/2018 1637   GLUCOSE 159 (H) 05/11/2012 0434   BUN 16 04/12/2018 1637   BUN 15 05/11/2012 0434   CREATININE 1.03 04/12/2018 1637   CREATININE 0.78 04/28/2017 1026   CALCIUM 9.1 04/12/2018 1637   CALCIUM 7.9 (L) 05/11/2012 0434   PROT 6.7 04/12/2018 1637   PROT 6.7 04/15/2017 0949   PROT 6.1 (L) 05/10/2012 0420   ALBUMIN 4.1 04/12/2018 1637   ALBUMIN 4.0 04/15/2017 0949   ALBUMIN 3.1 (L) 05/10/2012 0420   AST 17 04/12/2018 1637   AST 34 05/10/2012 0420   ALT 24 04/12/2018 1637   ALT 45 05/10/2012 0420   ALKPHOS 77 04/12/2018 1637   ALKPHOS 81 05/10/2012 0420   BILITOT 0.4 04/12/2018 1637   BILITOT 0.4 04/15/2017 0949   BILITOT 0.7 05/10/2012 0420   GFRNONAA >60 03/21/2018 2159   GFRNONAA >60 05/11/2012 0434   GFRAA >60 03/21/2018 2159   GFRAA >60 05/11/2012 0434     Imaging Studies: No results found.  Assessment and Plan:   Ronald Pruitt. is a 48 y.o. y/o male has been referred for diarrhea with weight loss.  The patient has been told that since he is 48 years old and has never had a colonoscopy he should be set up for a colonoscopy to look for source of his diarrhea and weight loss.  The patient does not have any triggering foods that have been causing his diarrhea.  The patient also reports that he needs a refill on his omeprazole and is quite miserable without it.  The patient will be continued on his omeprazole 20 mg/day.  The patient has been on it for a long time and has been told the risks and benefits of having the medication but has also been told the risks of continued heartburn esophageal strictures peptic ulcer disease and Barrett's esophagus with esophageal cancer if he has chronic heartburn.  The patient states he would like to stay on the medication.  He will also be set up for colonoscopy the next well appointment.  Follow Up Instructions:  I discussed the assessment and treatment plan with the patient. The patient was provided an opportunity to ask questions and all were answered. The patient agreed with the plan and demonstrated an understanding of the instructions.   The patient was advised to call back or seek an in-person evaluation if the symptoms worsen or if the condition fails to improve as anticipated.  I provided 20 minutes of non-face-to-face time during this encounter.   Ronald Lame, MD  Speech recognition software was used to dictate the above note.

## 2018-06-07 ENCOUNTER — Encounter: Payer: Self-pay | Admitting: Family

## 2018-06-09 ENCOUNTER — Other Ambulatory Visit: Payer: Self-pay | Admitting: Lab

## 2018-06-09 ENCOUNTER — Telehealth: Payer: Self-pay

## 2018-06-09 DIAGNOSIS — E1165 Type 2 diabetes mellitus with hyperglycemia: Secondary | ICD-10-CM

## 2018-06-09 MED ORDER — METFORMIN HCL ER 500 MG PO TB24: 1000.0000 mg | ORAL_TABLET | Freq: Two times a day (BID) | ORAL | 3 refills | Status: DC

## 2018-06-09 NOTE — Telephone Encounter (Signed)
The pharmacy called for a clarification of metformin, you wrote the RX for 90 tables and he is taking 2 per day it should have been for 120, I gave the pharmacist permission to change the quantity to 120 can you please fix the RX for the pt.  Thanks nina,cma

## 2018-06-11 MED ORDER — METFORMIN HCL ER (MOD) 1000 MG PO TB24: 1000.0000 mg | ORAL_TABLET | Freq: Two times a day (BID) | ORAL | 1 refills | Status: DC

## 2018-06-11 NOTE — Telephone Encounter (Signed)
Call pt Advise I sent in metformin 1000mg  tablets to be taken BID He had been on 500mg .

## 2018-06-22 ENCOUNTER — Other Ambulatory Visit: Payer: Self-pay | Admitting: Family Medicine

## 2018-06-22 DIAGNOSIS — E1165 Type 2 diabetes mellitus with hyperglycemia: Secondary | ICD-10-CM

## 2018-06-30 ENCOUNTER — Telehealth: Payer: Self-pay

## 2018-06-30 ENCOUNTER — Other Ambulatory Visit: Payer: Self-pay

## 2018-06-30 NOTE — Telephone Encounter (Signed)
LVM for pt to call office to schedule colonoscopy.  Scheduling Note: Dx: diarrhea and weight loss per video visit 05/05  Thanks Sharyn Lull

## 2018-07-05 ENCOUNTER — Encounter: Payer: Self-pay | Admitting: Family Medicine

## 2018-07-05 NOTE — Telephone Encounter (Signed)
Called Pt and he stated the Victoza coupon you gave him is no longer working. Pt stated it worked in May 2020 at his [employer] but now it is not. He said you told him to contact you if he had any problems with the coupon.

## 2018-07-05 NOTE — Telephone Encounter (Signed)
I am not sure what I can do about the coupon -- that is through the drug company  If he wants to make an appt to discuss diabetes we can do a doxy with PCP margaret or me

## 2018-07-08 ENCOUNTER — Telehealth: Payer: Self-pay | Admitting: Pharmacist

## 2018-07-08 NOTE — Telephone Encounter (Signed)
Received message from Philis Nettle, NP that patient was having problems with Victoza coupon card.   Contacted patient, left HIPAA compliant message for him to return my call at his convenience.   [employer] coupon card department [phone]). They noted that the card is working, but his BCBS copayment was much higher.   Sales promotion account executive. Provided information; determined that patient actually has a new customer ID- [id number]  Colp; provided this information. However, copayment came out to the same amount with the coupon card. I am unsure if he had a new prescription deductible that he needs to meet when his ID changed. Will encourage patient to contact [facility] and investigate his benefits.   Catie Darnelle Maffucci, PharmD, [place] PGY2 Ambulatory Care Pharmacy Resident, [facility] Phone: [phone]

## 2018-07-18 ENCOUNTER — Encounter: Payer: Self-pay | Admitting: Family

## 2018-07-18 ENCOUNTER — Other Ambulatory Visit: Payer: Self-pay | Admitting: Urology

## 2018-07-18 ENCOUNTER — Other Ambulatory Visit: Payer: Self-pay | Admitting: Family

## 2018-07-18 DIAGNOSIS — I10 Essential (primary) hypertension: Secondary | ICD-10-CM

## 2018-07-18 DIAGNOSIS — N529 Male erectile dysfunction, unspecified: Secondary | ICD-10-CM

## 2018-07-19 ENCOUNTER — Other Ambulatory Visit: Payer: Self-pay

## 2018-07-19 ENCOUNTER — Telehealth: Payer: Self-pay

## 2018-07-19 DIAGNOSIS — N529 Male erectile dysfunction, unspecified: Secondary | ICD-10-CM

## 2018-07-19 MED ORDER — TADALAFIL 20 MG PO TABS: 20.0000 mg | ORAL_TABLET | Freq: Every day | ORAL | 5 refills | Status: DC | PRN

## 2018-07-19 NOTE — Telephone Encounter (Signed)
Pt sent mychart message requesting refill on tadalafil, rx sent.

## 2018-09-22 ENCOUNTER — Other Ambulatory Visit: Payer: Self-pay

## 2018-09-22 MED ORDER — METFORMIN HCL ER 500 MG PO TB24: ORAL_TABLET | ORAL | 0 refills | Status: DC

## 2018-10-04 ENCOUNTER — Encounter: Payer: Self-pay | Admitting: Family Medicine

## 2018-10-04 DIAGNOSIS — E1165 Type 2 diabetes mellitus with hyperglycemia: Secondary | ICD-10-CM

## 2018-10-04 DIAGNOSIS — Z1211 Encounter for screening for malignant neoplasm of colon: Secondary | ICD-10-CM

## 2018-10-05 ENCOUNTER — Other Ambulatory Visit: Payer: Self-pay

## 2018-10-05 DIAGNOSIS — Z1211 Encounter for screening for malignant neoplasm of colon: Secondary | ICD-10-CM

## 2018-10-05 MED ORDER — NA SULFATE-K SULFATE-MG SULF 17.5-3.13-1.6 GM/177ML PO SOLN: 354.0000 mL | Freq: Once | ORAL | 0 refills | Status: AC

## 2018-10-05 NOTE — Telephone Encounter (Signed)
Patient needs appt, most likely needs new labs also  We can also see if Catie can offer [place] any advice on coupons/better med choice

## 2018-10-05 NOTE — Telephone Encounter (Signed)
Scheduled patient for Colonoscopy on 10/12/2018. Sent instruction through [place] and went over the instructions over the phone with patient. Sent prep to pharmacy

## 2018-10-07 ENCOUNTER — Telehealth: Payer: Self-pay

## 2018-10-07 NOTE — Telephone Encounter (Signed)
I spoke with patient to let him know Catie should be reaching out to him. I have also scheduled him a follow-up with Lauren 9/21 so he can have a1c checked.

## 2018-10-08 ENCOUNTER — Other Ambulatory Visit: Payer: Self-pay

## 2018-10-08 ENCOUNTER — Other Ambulatory Visit
Admission: RE | Admit: 2018-10-08 | Discharge: 2018-10-08 | Disposition: A | Discharge status: Home / Self Care | Payer: BLUE CROSS/BLUE SHIELD | Source: Ambulatory Visit | Attending: Gastroenterology | Admitting: Gastroenterology

## 2018-10-08 DIAGNOSIS — Z20828 Contact with and (suspected) exposure to other viral communicable diseases: Secondary | ICD-10-CM | POA: Insufficient documentation

## 2018-10-08 DIAGNOSIS — Z01812 Encounter for preprocedural laboratory examination: Secondary | ICD-10-CM | POA: Diagnosis not present

## 2018-10-08 LAB — SARS CORONAVIRUS 2 (TAT 6-24 HRS): SARS Coronavirus 2: NEGATIVE

## 2018-10-11 ENCOUNTER — Encounter: Payer: Self-pay | Admitting: *Deleted

## 2018-10-11 ENCOUNTER — Ambulatory Visit: Payer: BLUE CROSS/BLUE SHIELD | Admitting: Pharmacist

## 2018-10-11 DIAGNOSIS — E1165 Type 2 diabetes mellitus with hyperglycemia: Secondary | ICD-10-CM

## 2018-10-11 MED ORDER — METFORMIN HCL ER 500 MG PO TB24: 1000.0000 mg | ORAL_TABLET | Freq: Two times a day (BID) | ORAL | 1 refills | Status: DC

## 2018-10-11 MED ORDER — BASAGLAR KWIKPEN 100 UNIT/ML ~~LOC~~ SOPN: 48.0000 [IU] | PEN_INJECTOR | Freq: Every day | SUBCUTANEOUS | 1 refills | Status: DC

## 2018-10-11 NOTE — Chronic Care Management (AMB) (Signed)
Chronic Care Management   Follow Up Note   10/11/2018 Name: Ronald Pruitt. MRN: [id number] DOB: 1971-01-21  Referred by: Burnard Hawthorne, FNP - though sending to supervising provider d/t upcoming leave of absence  Reason for referral : Chronic Care Management (Medication Management)   [name]. is a 48 y.o. year old male who is a primary care patient of Burnard Hawthorne, FNP. The CCM team was consulted for assistance with chronic disease management and care coordination needs.    Contacted for medication management support today.   Review of patient status, including review of consultants reports, relevant laboratory and other test results, and collaboration with appropriate care team members and the patient's provider was performed as part of comprehensive patient evaluation and provision of chronic care management services.    SDOH (Social Determinants of Health) screening performed today: Financial Strain  Physical Activity. See Care Plan for related entries.   Outpatient Encounter Medications as of 10/11/2018  Medication Sig Note  . Insulin Glargine (BASAGLAR KWIKPEN) 100 UNIT/ML SOPN Inject 0.48 mLs (48 Units total) into the skin daily. Increase as instructed. Max daily dose 60 units daily   . metFORMIN (GLUCOPHAGE-XR) 500 MG 24 hr tablet Take 2 tablets (1,000 mg total) by mouth 2 (two) times daily.   . [DISCONTINUED] Insulin Glargine (BASAGLAR KWIKPEN) 100 UNIT/ML SOPN Inject 0.44 mLs (44 Units total) into the skin daily. Increase as instructed. Max daily dose 60 units daily   . [DISCONTINUED] metFORMIN (GLUCOPHAGE-XR) 500 MG 24 hr tablet Take one (500 MG) tablet by mouth twice daily with a meal. 10/11/2018: 500 mg QAM   . amLODipine (NORVASC) 10 MG tablet Take 1 tablet by mouth once daily   . atorvastatin (LIPITOR) 40 MG tablet Take 1 tablet (40 mg total) by mouth daily.   . Blood Glucose Monitoring Suppl (ONETOUCH VERIO) w/Device KIT 1 Device by Does not apply  route daily.   . cetirizine (EQ ALLERGY RELIEF, CETIRIZINE,) 10 MG tablet Take 1 tablet (10 mg total) by mouth daily. (Patient not taking: Reported on 04/26/2018)   . doxycycline (VIBRAMYCIN) 100 MG capsule    . glucose blood test strip Use to check blood sugar up to 3 times daily   . Insulin Pen Needle (NOVOFINE) 30G X 8 MM MISC Use to inject insulin and Victoza each 1x per day E11.9   . lisinopril (PRINIVIL,ZESTRIL) 10 MG tablet Take 1 tablet by mouth once daily   . omeprazole (PRILOSEC) 20 MG capsule Take 1 capsule by mouth once daily   . tadalafil (CIALIS) 20 MG tablet Take 1 tablet (20 mg total) by mouth daily as needed for erectile dysfunction.   Marland Kitchen VICTOZA 18 MG/3ML SOPN INJECT 0.2MLS (1.2MG TOTAL) INTO THE SKIN DAILY (Patient not taking: Reported on 10/11/2018)    No facility-administered encounter medications on file as of 10/11/2018.      Goals Addressed            This Visit's Progress     Patient Stated   . "My sugars are uncontrolled" (pt-stated)       Current Barriers:  . Diabetes: uncontrolled; most recent A1c 11.9%, DUE o Reports there were insurance issues with metformin formulation; unsure which dose/formulation of metformin he was having concerns with, but he notes that he is taking 1 tab daily now with mild GI upset o Notes that he cannot afford both Basaglar and Victoza, even with coupon cards, d/t significant deductible that he has  .  Current antihyperglycemic regimen: metformin XR 500 mg, Basaglar 44 units daily . Denies hypoglycemic episodes or symptoms . Reports improvement in symptoms of hyperglycemia, including polydipsia and polyuria . Current meal patterns: o Breakfast: Sausage McMuffin; medium sweet tea o Lunch: Hamburgers, Subs; meal w/ drink o Supper: Chicken, hot dogs, little bit of everything, brother cooks a lot o Snacks: Generally avoids snacking during the day; occasionally granola bar o Drinks: Coffee + 1 sugar packet and cream; water, gatorade,  infrequent soda  . 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 Current blood glucose readings: [place] 120-130s; occasional elevations to 200s; afternoons 180s  . Cardiovascular risk reduction: o Current hypertensive regimen: lisinopril 10 mg daily, amlodipine 10 mg daily; BP well controlled <130/80 at recent office visit o Current hyperlipidemia regimen: atorvastatin 40 mg daily; needs updated lipid profile  Pharmacist Clinical Goal(s):  Marland Kitchen Over the next 90 days, patient with work with PharmD and primary care provider to address optimized glycemic management  Interventions: . Comprehensive medication review performed, medication list updated in electronic medical record . Extensive discussion of dietary modifications: order 1/2 and 1/2 tea, avoid sodas and sugar gatorades (choose sugar free); try breadless sandwiches and burgers if he wants fries/chips, skip sides if he wants bread. Choose higher protein options to stay full longer  . Discussed staying active and setting target step counts daily.  . Increase Basaglar to 48 units daily. Sent updated prescription. Counseled to let us know when he pays through deductible to see if Victoza becomes more affordable.  . Counseled to hold metformin today and after colonoscopy until his diet returns to normal to prevent GI upset. Counseled to hold on increasing insulin dose until diet returns to normal as well.  Marland Kitchen Appointment next week with Philis Nettle, NP; DUE for A1c, lipids, CMP.  Patient Self Care Activities:  . Patient will check blood glucose BID , document, and provide at future appointments . Patient will take medications as prescribed . Patient will contact provider with any episodes of hypoglycemia . Patient will report any questions or concerns to  provider   Initial goal documentation       Plan:  - Will review labwork next week when available.  - Will outreach in 3-4 weeks for continued medication management support  Catie Darnelle Maffucci, PharmD, [place] Pharmacist [facility] [place] [phone]

## 2018-10-11 NOTE — Patient Instructions (Signed)
Visit Information  Goals Addressed            This Visit's Progress     Patient Stated   . "My sugars are uncontrolled" (pt-stated)       Current Barriers:  . Diabetes: uncontrolled; most recent A1c 11.9%, DUE o Reports there were insurance issues with metformin formulation; unsure which dose/formulation of metformin he was having concerns with, but he notes that he is taking 1 tab daily now with mild GI upset o Notes that he cannot afford both Basaglar and Victoza, even with coupon cards, d/t significant deductible that he has  . Current antihyperglycemic regimen: metformin XR 500 mg, Basaglar 44 units daily . Denies hypoglycemic episodes or symptoms . Reports improvement in symptoms of hyperglycemia, including polydipsia and polyuria . Current meal patterns: o Breakfast: Sausage McMuffin; medium sweet tea o Lunch: Hamburgers, Subs; meal w/ drink o Supper: Chicken, hot dogs, little bit of everything, brother cooks a lot o Snacks: Generally avoids snacking during the day; occasionally granola bar o Drinks: Coffee + 1 sugar packet and cream; water, gatorade, infrequent soda  . 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 Current blood glucose readings: Fastings 120-130s; occasional elevations to 200s; afternoons 180s  . Cardiovascular risk reduction: o Current hypertensive regimen: lisinopril 10 mg daily, amlodipine 10 mg daily; BP well controlled <130/80 at recent office visit o Current hyperlipidemia regimen: atorvastatin 40 mg daily; needs updated lipid profile  Pharmacist Clinical Goal(s):  Marland Kitchen Over the next 90 days, patient with work with PharmD and primary care provider to address optimized glycemic management  Interventions: . Comprehensive medication review performed, medication list  updated in electronic medical record . Extensive discussion of dietary modifications: order 1/2 and 1/2 tea, avoid sodas and sugar gatorades (choose sugar free); try breadless sandwiches and burgers if he wants fries/chips, skip sides if he wants bread. Choose higher protein options to stay full longer  . Discussed staying active and setting target step counts daily.  . Increase Basaglar to 48 units daily. Sent updated prescription. Counseled to let us know when he pays through deductible to see if Victoza becomes more affordable.  . Counseled to hold metformin today and after colonoscopy until his diet returns to normal to prevent GI upset. Counseled to hold on increasing insulin dose until diet returns to normal as well.  Marland Kitchen Appointment next week with Philis Nettle, NP; DUE for A1c, lipids, CMP.  Patient Self Care Activities:  . Patient will check blood glucose BID , document, and provide at future appointments . Patient will take medications as prescribed . Patient will contact provider with any episodes of hypoglycemia . Patient will report any questions or concerns to provider   Initial goal documentation        The patient verbalized understanding of instructions provided today and declined a print copy of patient instruction materials.   Plan:  - Will review labwork next week when available.  - Will outreach in 3-4 weeks for continued medication management support  Catie Darnelle Maffucci, PharmD, [place] Pharmacist [facility] [place] [phone]

## 2018-10-12 ENCOUNTER — Ambulatory Visit: Payer: BLUE CROSS/BLUE SHIELD | Admitting: Registered Nurse

## 2018-10-12 ENCOUNTER — Other Ambulatory Visit: Payer: Self-pay

## 2018-10-12 ENCOUNTER — Encounter: Payer: Self-pay | Admitting: *Deleted

## 2018-10-12 ENCOUNTER — Encounter
Admission: RE | Disposition: A | Discharge status: Home / Self Care | Payer: Self-pay | Source: Home / Self Care | Attending: Gastroenterology

## 2018-10-12 ENCOUNTER — Ambulatory Visit
Admission: RE | Admit: 2018-10-12 | Discharge: 2018-10-12 | Disposition: A | Discharge status: Home / Self Care | Payer: BLUE CROSS/BLUE SHIELD | Attending: Gastroenterology | Admitting: Gastroenterology

## 2018-10-12 DIAGNOSIS — E119 Type 2 diabetes mellitus without complications: Secondary | ICD-10-CM | POA: Insufficient documentation

## 2018-10-12 DIAGNOSIS — K219 Gastro-esophageal reflux disease without esophagitis: Secondary | ICD-10-CM | POA: Diagnosis not present

## 2018-10-12 DIAGNOSIS — D122 Benign neoplasm of ascending colon: Secondary | ICD-10-CM | POA: Insufficient documentation

## 2018-10-12 DIAGNOSIS — Z794 Long term (current) use of insulin: Secondary | ICD-10-CM | POA: Insufficient documentation

## 2018-10-12 DIAGNOSIS — F17210 Nicotine dependence, cigarettes, uncomplicated: Secondary | ICD-10-CM | POA: Insufficient documentation

## 2018-10-12 DIAGNOSIS — D124 Benign neoplasm of descending colon: Secondary | ICD-10-CM | POA: Diagnosis not present

## 2018-10-12 DIAGNOSIS — E785 Hyperlipidemia, unspecified: Secondary | ICD-10-CM | POA: Diagnosis not present

## 2018-10-12 DIAGNOSIS — K529 Noninfective gastroenteritis and colitis, unspecified: Secondary | ICD-10-CM | POA: Diagnosis not present

## 2018-10-12 DIAGNOSIS — M1990 Unspecified osteoarthritis, unspecified site: Secondary | ICD-10-CM | POA: Insufficient documentation

## 2018-10-12 DIAGNOSIS — G4730 Sleep apnea, unspecified: Secondary | ICD-10-CM | POA: Insufficient documentation

## 2018-10-12 DIAGNOSIS — D125 Benign neoplasm of sigmoid colon: Secondary | ICD-10-CM | POA: Insufficient documentation

## 2018-10-12 DIAGNOSIS — R197 Diarrhea, unspecified: Secondary | ICD-10-CM | POA: Diagnosis not present

## 2018-10-12 DIAGNOSIS — I10 Essential (primary) hypertension: Secondary | ICD-10-CM | POA: Diagnosis not present

## 2018-10-12 DIAGNOSIS — Z833 Family history of diabetes mellitus: Secondary | ICD-10-CM | POA: Diagnosis not present

## 2018-10-12 DIAGNOSIS — Z79899 Other long term (current) drug therapy: Secondary | ICD-10-CM | POA: Diagnosis not present

## 2018-10-12 DIAGNOSIS — Z1211 Encounter for screening for malignant neoplasm of colon: Secondary | ICD-10-CM

## 2018-10-12 DIAGNOSIS — Z6832 Body mass index (BMI) 32.0-32.9, adult: Secondary | ICD-10-CM | POA: Diagnosis not present

## 2018-10-12 DIAGNOSIS — K635 Polyp of colon: Secondary | ICD-10-CM | POA: Diagnosis not present

## 2018-10-12 DIAGNOSIS — E669 Obesity, unspecified: Secondary | ICD-10-CM | POA: Insufficient documentation

## 2018-10-12 HISTORY — PX: COLONOSCOPY WITH PROPOFOL: SHX5780

## 2018-10-12 LAB — GLUCOSE, CAPILLARY: Glucose-Capillary: 280 mg/dL — ABNORMAL HIGH (ref 70–99)

## 2018-10-12 SURGERY — COLONOSCOPY WITH PROPOFOL
Anesthesia: General

## 2018-10-12 MED ORDER — PHENYLEPHRINE HCL (PRESSORS) 10 MG/ML IV SOLN
INTRAVENOUS | Status: DC | PRN
  Administered 2018-10-12: 100 ug via INTRAVENOUS

## 2018-10-12 MED ORDER — PROPOFOL 500 MG/50ML IV EMUL
INTRAVENOUS | Status: DC | PRN
  Administered 2018-10-12: 140 ug/kg/min via INTRAVENOUS

## 2018-10-12 MED ORDER — SODIUM CHLORIDE 0.9 % IV SOLN
INTRAVENOUS | Status: DC
  Administered 2018-10-12: 09:00:00 1000 mL via INTRAVENOUS

## 2018-10-12 MED ORDER — PROPOFOL 10 MG/ML IV BOLUS
INTRAVENOUS | Status: DC | PRN
  Administered 2018-10-12: 40 mg via INTRAVENOUS
  Administered 2018-10-12 (×2): 30 mg via INTRAVENOUS

## 2018-10-12 NOTE — Anesthesia Preprocedure Evaluation (Signed)
Anesthesia Evaluation  Patient identified by MRN, date of birth, ID band Patient awake    Reviewed: Allergy & Precautions, H&P , NPO status , Patient's Chart, lab work & pertinent test results, reviewed documented beta blocker date and time   Airway Mallampati: II   Neck ROM: full    Dental  (+) Poor Dentition   Pulmonary sleep apnea , pneumonia, Current Smoker,    Pulmonary exam normal        Cardiovascular Exercise Tolerance: Good hypertension, On Medications negative cardio ROS Normal cardiovascular exam Rhythm:regular Rate:Normal     Neuro/Psych negative neurological ROS  negative psych ROS   GI/Hepatic Neg liver ROS, GERD  ,  Endo/Other  negative endocrine ROSdiabetes, Well Controlled  Renal/GU negative Renal ROS  negative genitourinary   Musculoskeletal   Abdominal   Peds  Hematology negative hematology ROS (+)   Anesthesia Other Findings Past Medical History: No date: Arthritis No date: BPH (benign prostatic hyperplasia) No date: Diabetes (HCC) No date: Dysuria No date: GERD (gastroesophageal reflux disease) No date: History of kidney stones No date: HTN (hypertension) No date: Hyperlipidemia No date: Impotence No date: Nocturia No date: Obesity age 17's: Pneumonia No date: Sleep apnea     Comment:  CPAP - severe (sleep study 01/11/16) No date: Testicular hypofunction No date: Wears dentures     Comment:  full upper and lower Past Surgical History: No date: CHOLECYSTECTOMY 05/20/2017: NASAL SEPTOPLASTY W/ TURBINOPLASTY; N/A     Comment:  Procedure: NASAL SEPTOPLASTY WITH SUBMUCOUS RESECTION;                Surgeon: Clyde Canterbury, MD;  Location: [facility] ORS;                Service: ENT;  Laterality: N/A; No date: VASECTOMY BMI    Body Mass Index: 32.55 kg/m     Reproductive/Obstetrics negative OB ROS                             Anesthesia Physical Anesthesia  Plan  ASA: III  Anesthesia Plan: General   Post-op Pain Management:    Induction:   PONV Risk Score and Plan:   Airway Management Planned:   Additional Equipment:   Intra-op Plan:   Post-operative Plan:   Informed Consent: I have reviewed the patients History and Physical, chart, labs and discussed the procedure including the risks, benefits and alternatives for the proposed anesthesia with the patient or authorized representative who has indicated his/her understanding and acceptance.     Dental Advisory Given  Plan Discussed with: CRNA  Anesthesia Plan Comments:         Anesthesia Quick Evaluation

## 2018-10-12 NOTE — Anesthesia Post-op Follow-up Note (Signed)
Anesthesia QCDR form completed.        

## 2018-10-12 NOTE — Op Note (Signed)
[facility] Gastroenterology Patient Name: Ronald Pruitt Procedure Date: 10/12/2018 8:18 AM MRN: [id number] Account #: [id number] Date of Birth: 02-11-1970 Admit Type: Outpatient Age: 48 Room: [facility] ENDO ROOM 4 Gender: Male Note Status: Finalized Procedure:            Colonoscopy Indications:          Chronic diarrhea Providers:            Lucilla Lame MD, MD Medicines:            Propofol per Anesthesia Complications:        No immediate complications. Procedure:            Pre-Anesthesia Assessment:                       - Prior to the procedure, a History and Physical was                        performed, and patient medications and allergies were                        reviewed. The patient's tolerance of previous                        anesthesia was also reviewed. The risks and benefits of                        the procedure and the sedation options and risks were                        discussed with the patient. All questions were                        answered, and informed consent was obtained. Prior                        Anticoagulants: The patient has taken no previous                        anticoagulant or antiplatelet agents. ASA Grade                        Assessment: II - A patient with mild systemic disease.                        After reviewing the risks and benefits, the patient was                        deemed in satisfactory condition to undergo the                        procedure.                       After obtaining informed consent, the colonoscope was                        passed under direct vision. Throughout the procedure,                        the patient's blood pressure, pulse, and oxygen  saturations were monitored continuously. The                        Colonoscope was introduced through the anus and                        advanced to the the terminal ileum. The colonoscopy was     performed without difficulty. The patient tolerated the                        procedure well. The quality of the bowel preparation                        was excellent. Findings:      The perianal and digital rectal examinations were normal.      The terminal ileum appeared normal. Biopsies were taken with a cold       forceps for histology.      A 3 mm polyp was found in the ascending colon. The polyp was sessile.       The polyp was removed with a cold biopsy forceps. Resection and       retrieval were complete.      A 6 mm polyp was found in the descending colon. The polyp was sessile.       The polyp was removed with a cold snare. Resection and retrieval were       complete. To prevent bleeding post-intervention, one hemostatic clip was       successfully placed (MR conditional).      A 4 mm polyp was found in the sigmoid colon. The polyp was sessile. The       polyp was removed with a cold snare. Resection and retrieval were       complete.      Random biopsies were obtained with cold forceps for histology randomly       in the entire colon. Impression:           - The examined portion of the ileum was normal.                        Biopsied.                       - One 3 mm polyp in the ascending colon, removed with a                        cold biopsy forceps. Resected and retrieved.                       - One 6 mm polyp in the descending colon, removed with                        a cold snare. Resected and retrieved. Clip (MR                        conditional) was placed.                       - One 4 mm polyp in the sigmoid colon, removed with a  cold snare. Resected and retrieved.                       - Random biopsies were obtained in the entire colon. Recommendation:       - Discharge patient to home.                       - Resume previous diet.                       - Continue present medications.                       - Await pathology  results.                       - Repeat colonoscopy in 5 years if polyp adenoma and 10                        years if hyperplastic Procedure Code(s):    --- Professional ---                       [phone], Colonoscopy, flexible; with removal of tumor(s),                        polyp(s), or other lesion(s) by snare technique                       [id number], 53, Colonoscopy, flexible; with biopsy, single                        or multiple Diagnosis Code(s):    --- Professional ---                       K52.9, Noninfective gastroenteritis and colitis,                        unspecified                       K63.5, Polyp of colon CPT copyright 2019 American Medical Association. All rights reserved. The codes documented in this report are preliminary and upon coder review may  be revised to meet current compliance requirements. Lucilla Lame MD, MD 10/12/2018 9:14:21 AM This report has been signed electronically. Number of Addenda: 0 Note Initiated On: 10/12/2018 8:18 AM Scope Withdrawal Time: 0 hours 11 minutes 55 seconds  Total Procedure Duration: 0 hours 14 minutes 22 seconds  Estimated Blood Loss: Estimated blood loss: none.      [facility]

## 2018-10-12 NOTE — H&P (Signed)
Ronald Lame, MD [address] [place], [place] [address] Phone:[phone] Fax : [phone]  Primary Care Physician:  Burnard Hawthorne, FNP Primary Gastroenterologist:  Dr. Allen Norris  Pre-Procedure History & Physical: HPI:  Ronald Pruitt. is a 48 y.o. male is here for an colonoscopy.   Past Medical History:  Diagnosis Date  . Arthritis   . BPH (benign prostatic hyperplasia)   . Diabetes ([place])   . Dysuria   . GERD (gastroesophageal reflux disease)   . History of kidney stones   . HTN (hypertension)   . Hyperlipidemia   . Impotence   . Nocturia   . Obesity   . Pneumonia age 12's  . Sleep apnea    CPAP - severe (sleep study 01/11/16)  . Testicular hypofunction   . Wears dentures    full upper and lower    Past Surgical History:  Procedure Laterality Date  . CHOLECYSTECTOMY    . NASAL SEPTOPLASTY W/ TURBINOPLASTY N/A 05/20/2017   Procedure: NASAL SEPTOPLASTY WITH SUBMUCOUS RESECTION;  Surgeon: Clyde Canterbury, MD;  Location: [facility] ORS;  Service: ENT;  Laterality: N/A;  . VASECTOMY      Prior to Admission medications   Medication Sig Start Date End Date Taking? Authorizing Provider  amLODipine (NORVASC) 10 MG tablet Take 1 tablet by mouth once daily 07/19/18  Yes Arnett, Yvetta Coder, FNP  atorvastatin (LIPITOR) 40 MG tablet Take 1 tablet (40 mg total) by mouth daily. 09/21/17  Yes Burnard Hawthorne, FNP  Blood Glucose Monitoring Suppl (ONETOUCH VERIO) w/Device KIT 1 Device by Does not apply route daily. 03/29/18  Yes Arnett, Yvetta Coder, FNP  cetirizine (EQ ALLERGY RELIEF, CETIRIZINE,) 10 MG tablet Take 1 tablet (10 mg total) by mouth daily. 03/31/18  Yes Burnard Hawthorne, FNP  glucose blood test strip Use to check blood sugar up to 3 times daily 04/26/18  Yes Crecencio Mc, MD  Insulin Glargine (BASAGLAR KWIKPEN) 100 UNIT/ML SOPN Inject 0.48 mLs (48 Units total) into the skin daily. Increase as instructed. Max daily dose 60 units daily 10/11/18  Yes Arnett,  Yvetta Coder, FNP  Insulin Pen Needle (NOVOFINE) 30G X 8 MM MISC Use to inject insulin and Victoza each 1x per day E11.9 03/15/17  Yes McLean-Scocuzza, Nino Glow, MD  lisinopril (PRINIVIL,ZESTRIL) 10 MG tablet Take 1 tablet by mouth once daily 05/12/18  Yes Arnett, Yvetta Coder, FNP  metFORMIN (GLUCOPHAGE-XR) 500 MG 24 hr tablet Take 2 tablets (1,000 mg total) by mouth 2 (two) times daily. 10/11/18  Yes Burnard Hawthorne, FNP  omeprazole (PRILOSEC) 20 MG capsule Take 1 capsule by mouth once daily 06/01/18  Yes Ronald Lame, MD  tadalafil (CIALIS) 20 MG tablet Take 1 tablet (20 mg total) by mouth daily as needed for erectile dysfunction. 07/19/18  Yes McGowan, Larene Beach A, PA-C  doxycycline (VIBRAMYCIN) 100 MG capsule  05/27/18   [provider]  VICTOZA 18 MG/3ML SOPN INJECT 0.2MLS (1.2MG TOTAL) INTO THE SKIN DAILY Patient not taking: Reported on 10/11/2018 06/24/18   Jodelle Green, FNP    Allergies as of 10/05/2018  . (No Known Allergies)    Family History  Problem Relation Age of Onset  . Hyperlipidemia Mother   . Diabetes Mellitus II Mother   . Seizures Mother   . Diabetes Mother   . Mental illness Mother   . Arthritis Mother   . Diabetes Father   . Cirrhosis Father        non alcoholic  .  Hyperlipidemia Father   . Arthritis Father   . Liver cancer Father   . Diabetes Maternal Grandmother   . Hyperlipidemia Maternal Grandmother   . Arthritis Maternal Grandmother   . Diabetes Maternal Grandfather   . Hyperlipidemia Maternal Grandfather   . Arthritis Maternal Grandfather   . Diabetes Paternal Grandmother   . Hyperlipidemia Paternal Grandmother   . Arthritis Paternal Grandmother   . Diabetes Paternal Grandfather   . Hyperlipidemia Paternal Grandfather   . Arthritis Paternal Grandfather   . Kidney disease Neg Hx   . Prostate cancer Neg Hx   . Kidney cancer Neg Hx   . Bladder Cancer Neg Hx   . Colon cancer Neg Hx     Social History   Socioeconomic History  . Marital status:  Significant Other    Spouse name: Not on file  . Number of children: Not on file  . Years of education: Not on file  . Highest education level: Not on file  Occupational History  . Not on file  Social Needs  . Financial resource strain: Not on file  . Food insecurity    Worry: Not on file    Inability: Not on file  . Transportation needs    Medical: Not on file    Non-medical: Not on file  Tobacco Use  . Smoking status: Current Every Day Smoker    Packs/day: 0.50    Years: 20.00    Pack years: 10.00    Types: Cigarettes  . Smokeless tobacco: Never Used  Substance and Sexual Activity  . Alcohol use: No    Alcohol/week: 0.0 standard drinks  . Drug use: Never  . Sexual activity: Not on file  Lifestyle  . Physical activity    Days per week: Not on file    Minutes per session: Not on file  . Stress: Not on file  Relationships  . Social Herbalist on phone: Not on file    Gets together: Not on file    Attends religious service: Not on file    Active member of club or organization: Not on file    Attends meetings of clubs or organizations: Not on file    Relationship status: Not on file  . Intimate partner violence    Fear of current or ex partner: Not on file    Emotionally abused: Not on file    Physically abused: Not on file    Forced sexual activity: Not on file  Other Topics Concern  . Not on file  Social History Narrative   [name]- part of merger   Has girlfriend    Review of Systems: See HPI, otherwise negative ROS  Physical Exam: BP (!) 129/102   Pulse 98   Temp (!) 96.1 F (35.6 C) (Tympanic)   Resp 18   Ht 6' (1.829 m)   Wt 108.9 kg   SpO2 98%   BMI 32.55 kg/m  General:   Alert,  pleasant and cooperative in NAD Head:  Normocephalic and atraumatic. Neck:  Supple; no masses or thyromegaly. Lungs:  Clear throughout to auscultation.    Heart:  Regular rate and rhythm. Abdomen:  Soft, nontender and nondistended. Normal bowel sounds,  without guarding, and without rebound.   Neurologic:  Alert and  oriented x4;  grossly normal neurologically.  Impression/Plan: Ronald Pruitt. is here for an colonoscopy to be performed for diarrhea  Risks, benefits, limitations, and alternatives regarding  colonoscopy have been reviewed with  the patient.  Questions have been answered.  All parties agreeable.   Ronald Lame, MD  10/12/2018, 8:33 AM

## 2018-10-12 NOTE — Transfer of Care (Signed)
Immediate Anesthesia Transfer of Care Note  Patient: Ronald Pruitt.  Procedure(s) Performed: COLONOSCOPY WITH PROPOFOL (N/A )  Patient Location: PACU  Anesthesia Type:General  Level of Consciousness: sedated  Airway & Oxygen Therapy: Patient Spontanous Breathing  Post-op Assessment: Report given to RN and Post -op Vital signs reviewed and stable  Post vital signs: Reviewed and stable  Last Vitals:  Vitals Value Taken Time  BP 94/72 10/12/18 0915  Temp 36.3 C 10/12/18 0915  Pulse 100 10/12/18 0915  Resp    SpO2 99 % 10/12/18 0915    Last Pain:  Vitals:   10/12/18 0824  TempSrc: Tympanic  PainSc: 0-No pain         Complications: No apparent anesthesia complications

## 2018-10-13 ENCOUNTER — Encounter: Payer: Self-pay | Admitting: Gastroenterology

## 2018-10-13 LAB — SURGICAL PATHOLOGY

## 2018-10-14 ENCOUNTER — Telehealth: Payer: BLUE CROSS/BLUE SHIELD

## 2018-10-14 ENCOUNTER — Encounter: Payer: Self-pay | Admitting: Gastroenterology

## 2018-10-18 ENCOUNTER — Encounter: Payer: Self-pay | Admitting: Family Medicine

## 2018-10-18 ENCOUNTER — Ambulatory Visit: Payer: BLUE CROSS/BLUE SHIELD | Admitting: Family Medicine

## 2018-10-19 ENCOUNTER — Ambulatory Visit: Payer: BLUE CROSS/BLUE SHIELD | Admitting: Family Medicine

## 2018-10-19 ENCOUNTER — Other Ambulatory Visit: Payer: Self-pay

## 2018-10-19 MED ORDER — INSULIN GLARGINE 100 UNIT/ML SOLOSTAR PEN: PEN_INJECTOR | SUBCUTANEOUS | 11 refills | Status: DC

## 2018-10-19 NOTE — Anesthesia Postprocedure Evaluation (Signed)
Anesthesia Post Note  Patient: Ronald Pruitt.  Procedure(s) Performed: COLONOSCOPY WITH PROPOFOL (N/A )  Patient location during evaluation: PACU Anesthesia Type: General Level of consciousness: awake and alert Pain management: pain level controlled Vital Signs Assessment: post-procedure vital signs reviewed and stable Respiratory status: spontaneous breathing, nonlabored ventilation, respiratory function stable and patient connected to nasal cannula oxygen Cardiovascular status: blood pressure returned to baseline and stable Postop Assessment: no apparent nausea or vomiting Anesthetic complications: no     Last Vitals:  Vitals:   10/12/18 0936 10/12/18 0946  BP: 129/89 (!) 125/93  Pulse: 84 82  Resp: 16 17  Temp:    SpO2: 99% 100%    Last Pain:  Vitals:   10/13/18 0754  TempSrc:   PainSc: 0-No pain                 Molli Barrows

## 2018-10-21 ENCOUNTER — Other Ambulatory Visit: Payer: Self-pay

## 2018-10-21 DIAGNOSIS — R6889 Other general symptoms and signs: Secondary | ICD-10-CM | POA: Diagnosis not present

## 2018-10-21 DIAGNOSIS — Z20822 Contact with and (suspected) exposure to covid-19: Secondary | ICD-10-CM

## 2018-10-22 LAB — NOVEL CORONAVIRUS, NAA: SARS-CoV-2, NAA: NOT DETECTED

## 2018-10-24 ENCOUNTER — Other Ambulatory Visit: Payer: Self-pay | Admitting: Family

## 2018-10-24 DIAGNOSIS — I10 Essential (primary) hypertension: Secondary | ICD-10-CM

## 2018-10-26 ENCOUNTER — Telehealth: Payer: Self-pay

## 2018-10-26 ENCOUNTER — Other Ambulatory Visit: Payer: Self-pay

## 2018-10-26 NOTE — Telephone Encounter (Signed)
-----   Message from Lucilla Lame, MD sent at 10/14/2018  8:30 AM EDT ----- This patient had multiple adenomas throughout the colon and needs a repeat colonoscopy in 5 years.  No source of his diarrhea was found so he should follow-up in the office to discuss further investigation for the cause of his diarrhea.  Please set him up for an office visit and for repeat colonoscopy in 5 years.

## 2018-10-26 NOTE — Telephone Encounter (Signed)
LVM for pt to return my call.  Also, sent pt a mychart message with results.

## 2018-10-26 NOTE — Telephone Encounter (Signed)
LVM for pt to return my call.

## 2018-11-15 ENCOUNTER — Other Ambulatory Visit: Payer: Self-pay

## 2018-11-15 DIAGNOSIS — Z20822 Contact with and (suspected) exposure to covid-19: Secondary | ICD-10-CM

## 2018-11-17 LAB — NOVEL CORONAVIRUS, NAA: SARS-CoV-2, NAA: NOT DETECTED

## 2019-01-28 ENCOUNTER — Other Ambulatory Visit: Payer: Self-pay | Admitting: Urology

## 2019-01-28 ENCOUNTER — Other Ambulatory Visit: Payer: Self-pay | Admitting: Gastroenterology

## 2019-01-28 DIAGNOSIS — N529 Male erectile dysfunction, unspecified: Secondary | ICD-10-CM

## 2019-01-28 DIAGNOSIS — K219 Gastro-esophageal reflux disease without esophagitis: Secondary | ICD-10-CM

## 2019-02-05 ENCOUNTER — Encounter: Payer: Self-pay | Admitting: Family

## 2019-02-05 ENCOUNTER — Other Ambulatory Visit: Payer: Self-pay | Admitting: Urology

## 2019-02-05 DIAGNOSIS — N529 Male erectile dysfunction, unspecified: Secondary | ICD-10-CM

## 2019-02-07 ENCOUNTER — Telehealth: Payer: Self-pay | Admitting: Urology

## 2019-02-07 ENCOUNTER — Other Ambulatory Visit: Payer: Self-pay

## 2019-02-07 DIAGNOSIS — N529 Male erectile dysfunction, unspecified: Secondary | ICD-10-CM

## 2019-02-07 DIAGNOSIS — K219 Gastro-esophageal reflux disease without esophagitis: Secondary | ICD-10-CM

## 2019-02-07 MED ORDER — TADALAFIL 20 MG PO TABS: ORAL_TABLET | ORAL | 0 refills | Status: DC

## 2019-02-07 MED ORDER — OMEPRAZOLE 20 MG PO CPDR: DELAYED_RELEASE_CAPSULE | ORAL | 1 refills | Status: DC

## 2019-02-07 NOTE — Telephone Encounter (Signed)
I declined the refill request on Ronald Pruitt's tadalafil.  He has not been seen in the office since 11/2017.  He will need an office visit prior to any further refills.

## 2019-02-15 ENCOUNTER — Other Ambulatory Visit: Payer: Self-pay | Admitting: Family

## 2019-02-15 DIAGNOSIS — I10 Essential (primary) hypertension: Secondary | ICD-10-CM

## 2019-02-23 ENCOUNTER — Telehealth: Payer: Self-pay

## 2019-02-23 DIAGNOSIS — E119 Type 2 diabetes mellitus without complications: Secondary | ICD-10-CM

## 2019-02-23 NOTE — Telephone Encounter (Signed)
PA was sent for patient's Victoza. However when I did PA I looked back & he has not tried formulary alternatives such as ozempic, trulicity, bydureon or byetta. I doubt that PA will be approved. Would you like to switch to one that is covered if okay with patient?

## 2019-02-24 MED ORDER — OZEMPIC (0.25 OR 0.5 MG/DOSE) 2 MG/1.5ML ~~LOC~~ SOPN: 0.2500 mg | PEN_INJECTOR | SUBCUTANEOUS | 3 refills | Status: DC

## 2019-02-24 NOTE — Telephone Encounter (Signed)
Call pt Tell him We can try ozempic if agreeable  Please confirm NO family or personal h/o THYROID CANCER .  He is well overdue for f/u and a1c Please sch an appt with me.

## 2019-02-24 NOTE — Addendum Note (Signed)
Addended by: Burnard Hawthorne on: 02/24/2019 11:21 PM   Modules accepted: Orders

## 2019-02-28 NOTE — Telephone Encounter (Signed)
LM asking patient to call back

## 2019-03-01 ENCOUNTER — Encounter: Payer: Self-pay | Admitting: Family

## 2019-03-01 NOTE — Telephone Encounter (Signed)
I spoke with patient to let him know that medication was changed to ozempic. Patient stated that he would let us know if any issues with medication. He will also call to make appointment as soon as he gets new issuance card.

## 2019-03-01 NOTE — Telephone Encounter (Signed)
noted 

## 2019-03-07 ENCOUNTER — Encounter: Payer: Self-pay | Admitting: Family

## 2019-03-10 ENCOUNTER — Encounter: Payer: Self-pay | Admitting: Family

## 2019-03-11 ENCOUNTER — Other Ambulatory Visit: Payer: Self-pay

## 2019-03-11 DIAGNOSIS — N529 Male erectile dysfunction, unspecified: Secondary | ICD-10-CM

## 2019-03-11 MED ORDER — TADALAFIL 20 MG PO TABS: ORAL_TABLET | ORAL | 0 refills | Status: DC

## 2019-03-18 ENCOUNTER — Emergency Department
Admission: EM | Admit: 2019-03-18 | Discharge: 2019-03-18 | Disposition: A | Discharge status: Home / Self Care | Payer: BC Managed Care – PPO | Attending: Emergency Medicine | Admitting: Emergency Medicine

## 2019-03-18 ENCOUNTER — Encounter: Payer: Self-pay | Admitting: Emergency Medicine

## 2019-03-18 ENCOUNTER — Emergency Department: Payer: BC Managed Care – PPO

## 2019-03-18 DIAGNOSIS — Y939 Activity, unspecified: Secondary | ICD-10-CM | POA: Diagnosis not present

## 2019-03-18 DIAGNOSIS — Z79899 Other long term (current) drug therapy: Secondary | ICD-10-CM | POA: Insufficient documentation

## 2019-03-18 DIAGNOSIS — M7051 Other bursitis of knee, right knee: Secondary | ICD-10-CM | POA: Diagnosis not present

## 2019-03-18 DIAGNOSIS — M7041 Prepatellar bursitis, right knee: Secondary | ICD-10-CM | POA: Diagnosis not present

## 2019-03-18 DIAGNOSIS — Z794 Long term (current) use of insulin: Secondary | ICD-10-CM | POA: Insufficient documentation

## 2019-03-18 DIAGNOSIS — E119 Type 2 diabetes mellitus without complications: Secondary | ICD-10-CM | POA: Insufficient documentation

## 2019-03-18 DIAGNOSIS — M25561 Pain in right knee: Secondary | ICD-10-CM | POA: Diagnosis not present

## 2019-03-18 DIAGNOSIS — M109 Gout, unspecified: Secondary | ICD-10-CM | POA: Insufficient documentation

## 2019-03-18 DIAGNOSIS — F17210 Nicotine dependence, cigarettes, uncomplicated: Secondary | ICD-10-CM | POA: Insufficient documentation

## 2019-03-18 DIAGNOSIS — I10 Essential (primary) hypertension: Secondary | ICD-10-CM | POA: Insufficient documentation

## 2019-03-18 DIAGNOSIS — M10061 Idiopathic gout, right knee: Secondary | ICD-10-CM | POA: Diagnosis not present

## 2019-03-18 DIAGNOSIS — M25461 Effusion, right knee: Secondary | ICD-10-CM | POA: Diagnosis not present

## 2019-03-18 NOTE — ED Triage Notes (Signed)
Pt c/o right knee pain x3 weeks, worsening tonight while working. Pt denies injury. Swelling noted.

## 2019-03-18 NOTE — ED Notes (Signed)
Computer's E-sign pad was malfunctioning and pt signed a hardcopy. The signed consent was placed for medical records pickup

## 2019-03-18 NOTE — ED Provider Notes (Signed)
[facility] Emergency Department Provider Note  ____________________________________________   First MD Initiated Contact with Patient 03/18/19 [phone]     (approximate)  I have reviewed the triage vital signs and the nursing notes.   HISTORY  Chief Complaint Knee Pain    HPI Ronald Pruitt. is a 49 y.o. male with medical history as listed below who presents for evaluation of worsening pain and swelling in his right knee.  He has had issues with it for some time, likely as many as 3 weeks, but it seems to get better when he is resting and not working and then worse when he is again working and on his feet for 12 hours at a time.  He had no specific trauma of which he was aware.  It is not red or sore to the touch but is worse with weightbearing and walking.  No history of gout.  He denies fever/chills, sore throat, chest pain, shortness of breath, nausea, and vomiting.  No known history of arthritis.  He spends a lot of time walking around and standing for his job but he does not kneel down on his knees.        Past Medical History:  Diagnosis Date  . Arthritis   . BPH (benign prostatic hyperplasia)   . Diabetes ([place])   . Dysuria   . GERD (gastroesophageal reflux disease)   . History of kidney stones   . HTN (hypertension)   . Hyperlipidemia   . Impotence   . Nocturia   . Obesity   . Pneumonia age 61's  . Sleep apnea    CPAP - severe (sleep study 01/11/16)  . Testicular hypofunction   . Wears dentures    full upper and lower    Patient Active Problem List   Diagnosis Date Noted  . Polyp of colon   . Diarrhea 03/31/2018  . Muscle cramping 03/31/2018  . Acute recurrent sinusitis 03/25/2017  . Fatty liver disease, nonalcoholic 90/24/0973  . HLD (hyperlipidemia) 08/26/2016  . Nocturia 08/18/2016  . DM (diabetes mellitus) ([place]) 08/18/2016  . HTN (hypertension) 08/18/2016  . OSA (obstructive sleep apnea) 08/18/2016  . Erectile dysfunction  of organic origin 03/15/2015  . Hypogonadism in male 03/15/2015  . BPH with obstruction/lower urinary tract symptoms 03/15/2015    Past Surgical History:  Procedure Laterality Date  . CHOLECYSTECTOMY    . COLONOSCOPY WITH PROPOFOL N/A 10/12/2018   Procedure: COLONOSCOPY WITH PROPOFOL;  Surgeon: Lucilla Lame, MD;  [facility] END[facility]COPY;  Service: Endoscopy;  Laterality: N/A;  . NASAL SEPTOPLASTY W/ TURBINOPLASTY N/A 05/20/2017   Procedure: NASAL SEPTOPLASTY WITH SUBMUCOUS RESECTION;  Surgeon: Clyde Canterbury, MD;  Location: [facility] ORS;  Service: ENT;  Laterality: N/A;  . VASECTOMY      Prior to Admission medications   Medication Sig Start Date End Date Taking? Authorizing Provider  amLODipine (NORVASC) 10 MG tablet Take 1 tablet by mouth once daily 02/16/19   Burnard Hawthorne, FNP  atorvastatin (LIPITOR) 40 MG tablet Take 1 tablet (40 mg total) by mouth daily. 09/21/17   Burnard Hawthorne, FNP  Blood Glucose Monitoring Suppl (ONETOUCH VERIO) w/Device KIT 1 Device by Does not apply route daily. 03/29/18   Burnard Hawthorne, FNP  cetirizine (EQ ALLERGY RELIEF, CETIRIZINE,) 10 MG tablet Take 1 tablet (10 mg total) by mouth daily. 03/31/18   Burnard Hawthorne, FNP  doxycycline (VIBRAMYCIN) 100 MG capsule  05/27/18   [provider]  glucose blood test  strip Use to check blood sugar up to 3 times daily 04/26/18   Crecencio Mc, MD  Insulin Glargine (LANTUS) 100 UNIT/ML Solostar Pen Inject 48 units into the skin daily. 10/19/18   Jodelle Green, FNP  Insulin Pen Needle (NOVOFINE) 30G X 8 MM MISC Use to inject insulin and Victoza each 1x per day E11.9 03/15/17   McLean-Scocuzza, Nino Glow, MD  lisinopril (ZESTRIL) 10 MG tablet Take 1 tablet by mouth once daily 02/16/19   Burnard Hawthorne, FNP  metFORMIN (GLUCOPHAGE-XR) 500 MG 24 hr tablet Take 2 tablets (1,000 mg total) by mouth 2 (two) times daily. 10/11/18   Burnard Hawthorne, FNP  omeprazole (PRILOSEC) 20 MG capsule Take 1 capsule by mouth  once daily 02/07/19   Burnard Hawthorne, FNP  Semaglutide,0.25 or 0.5MG/DOS, (OZEMPIC, 0.25 OR 0.5 MG/DOSE,) 2 MG/1.5ML SOPN Inject 0.25 mg into the skin once a week. 02/24/19   Burnard Hawthorne, FNP  tadalafil (CIALIS) 20 MG tablet TAKE 1 TABLET BY MOUTH ONCE DAILY AS NEEDED FOR ERECTILE DYSFUNCTION 03/11/19   Burnard Hawthorne, FNP    Allergies Patient has no known allergies.  Family History  Problem Relation Age of Onset  . Hyperlipidemia Mother   . Diabetes Mellitus II Mother   . Seizures Mother   . Diabetes Mother   . Mental illness Mother   . Arthritis Mother   . Diabetes Father   . Cirrhosis Father        non alcoholic  . Hyperlipidemia Father   . Arthritis Father   . Liver cancer Father   . Diabetes Maternal Grandmother   . Hyperlipidemia Maternal Grandmother   . Arthritis Maternal Grandmother   . Diabetes Maternal Grandfather   . Hyperlipidemia Maternal Grandfather   . Arthritis Maternal Grandfather   . Diabetes Paternal Grandmother   . Hyperlipidemia Paternal Grandmother   . Arthritis Paternal Grandmother   . Diabetes Paternal Grandfather   . Hyperlipidemia Paternal Grandfather   . Arthritis Paternal Grandfather   . Kidney disease Neg Hx   . Prostate cancer Neg Hx   . Kidney cancer Neg Hx   . Bladder Cancer Neg Hx   . Colon cancer Neg Hx     Social History Social History   Tobacco Use  . Smoking status: Current Every Day Smoker    Packs/day: 0.50    Years: 20.00    Pack years: 10.00    Types: Cigarettes  . Smokeless tobacco: Never Used  Substance Use Topics  . Alcohol use: No    Alcohol/week: 0.0 standard drinks  . Drug use: Never    Review of Systems Constitutional: No fever/chills Cardiovascular: Denies chest pain. Respiratory: Denies shortness of breath. Gastrointestinal: No abdominal pain.  No nausea, no vomiting.   Musculoskeletal: Right knee pain and swelling. Integumentary: Negative for rash. Neurological: Negative for headaches, focal  weakness or numbness.   ____________________________________________   PHYSICAL EXAM:  VITAL SIGNS: ED Triage Vitals [03/18/19 0248]  Enc Vitals Group     BP (!) 143/93     Pulse Rate 96     Resp 17     Temp (!) 97.5 F (36.4 C)     Temp Source Oral     SpO2 100 %     Weight      Height      Head Circumference      Peak Flow      Pain Score      Pain Loc  Pain Edu?      Excl. in [place]?     Constitutional: Alert and oriented.  No acute distress noted. Eyes: Conjunctivae are normal.  Head: Atraumatic. Cardiovascular: Normal rate, regular rhythm. Good peripheral circulation. Respiratory: Normal respiratory effort.   Musculoskeletal: The patient has some suprapatellar swelling on the medial side that is most notable when he is flexing his knee.  It is soft and mildly tender but not erythematous and not overly warm.  It feels most consistent with a bursitis.  There is no joint effusion and his knee is otherwise unremarkable on physical exam. Neurologic:  Normal speech and language. No gross focal neurologic deficits are appreciated.  Skin:  Skin is warm, dry and intact. Psychiatric: Mood and affect are normal. Speech and behavior are normal.  ____________________________________________   LABS (all labs ordered are listed, but only abnormal results are displayed)  Labs Reviewed - No data to display ____________________________________________  EKG  No indication for EKG ____________________________________________  RADIOLOGY I, Hinda Kehr, personally viewed and evaluated these images (plain radiographs) as part of my medical decision making, as well as reviewing the written report by the radiologist.  ED MD interpretation: No acute abnormalities identified on radiographs.  Official radiology report(s): DG Knee Complete 4 Views Right  Result Date: 03/18/2019 CLINICAL DATA:  Right knee pain for 3 weeks EXAM: RIGHT KNEE - COMPLETE 4+ VIEW COMPARISON:  None.  FINDINGS: No evidence of fracture, dislocation, or joint effusion. No evidence of arthropathy or other focal bone abnormality. Soft tissues are unremarkable. IMPRESSION: Negative. Electronically Signed   By: Ulyses Jarred M.D.   On: 03/18/2019 03:15    ____________________________________________   PROCEDURES   Procedure(s) performed (including Critical Care):  Procedures   ____________________________________________   INITIAL IMPRESSION / MDM / [place] / ED COURSE  As part of my medical decision making, I reviewed the following data within the [place] notes reviewed and incorporated and Notes from prior ED visits   Exam findings are most consistent with inflammatory bursitis.  Septic bursitis or septic arthritis or very unlikely.  Vital signs are stable.  Radiographs unremarkable.  I counseled the patient about conservative management of bursitis including Ace wrap, NSAIDs, warm compresses, elevation, and close follow-up with orthopedics.  He understands and agrees with the plan.          ____________________________________________  FINAL CLINICAL IMPRESSION(S) / ED DIAGNOSES  Final diagnoses:  Suprapatellar bursitis of right knee     MEDICATIONS GIVEN DURING THIS VISIT:  Medications - No data to display   ED Discharge Orders    None      *Please note:  Ronald Pruitt. was evaluated in Emergency Department on 03/18/2019 for the symptoms described in the history of present illness. He was evaluated in the context of the global COVID-19 pandemic, which necessitated consideration that the patient might be at risk for infection with the SARS-CoV-2 virus that causes COVID-19. Institutional protocols and algorithms that pertain to the evaluation of patients at risk for COVID-19 are in a state of rapid change based on information released by regulatory bodies including the CDC and federal and state organizations. These policies  and algorithms were followed during the patient's care in the ED.  Some ED evaluations and interventions may be delayed as a result of limited staffing during the pandemic.*  Note:  This document was prepared using Dragon voice recognition software and may include unintentional dictation errors.   Hinda Kehr,  MD 03/18/19 0511

## 2019-03-18 NOTE — Discharge Instructions (Addendum)
As we discussed, you need to rest the leg as much as possible and use warm compresses or heating pads.  Take over-the-counter ibuprofen 600 mg 3 times a day with meals and use the Ace wrap.  Keep your leg elevated when possible.  Call orthopedics later today to schedule the next available follow-up appointment.  Return to the emergency department if you develop new or worsening symptoms that concern you.

## 2019-03-24 DIAGNOSIS — M2241 Chondromalacia patellae, right knee: Secondary | ICD-10-CM | POA: Insufficient documentation

## 2019-03-24 DIAGNOSIS — M10061 Idiopathic gout, right knee: Secondary | ICD-10-CM | POA: Diagnosis not present

## 2019-03-25 ENCOUNTER — Encounter (HOSPITAL_COMMUNITY): Payer: Self-pay | Admitting: Emergency Medicine

## 2019-03-25 ENCOUNTER — Other Ambulatory Visit: Payer: Self-pay

## 2019-03-25 ENCOUNTER — Emergency Department (HOSPITAL_COMMUNITY)
Admission: EM | Admit: 2019-03-25 | Discharge: 2019-03-26 | Disposition: A | Discharge status: Home / Self Care | Payer: No Typology Code available for payment source | Attending: Emergency Medicine | Admitting: Emergency Medicine

## 2019-03-25 DIAGNOSIS — Z794 Long term (current) use of insulin: Secondary | ICD-10-CM | POA: Insufficient documentation

## 2019-03-25 DIAGNOSIS — W231XXA Caught, crushed, jammed, or pinched between stationary objects, initial encounter: Secondary | ICD-10-CM | POA: Insufficient documentation

## 2019-03-25 DIAGNOSIS — S6991XA Unspecified injury of right wrist, hand and finger(s), initial encounter: Secondary | ICD-10-CM | POA: Diagnosis present

## 2019-03-25 DIAGNOSIS — I10 Essential (primary) hypertension: Secondary | ICD-10-CM | POA: Insufficient documentation

## 2019-03-25 DIAGNOSIS — E119 Type 2 diabetes mellitus without complications: Secondary | ICD-10-CM | POA: Insufficient documentation

## 2019-03-25 DIAGNOSIS — S62524B Nondisplaced fracture of distal phalanx of right thumb, initial encounter for open fracture: Secondary | ICD-10-CM | POA: Diagnosis not present

## 2019-03-25 DIAGNOSIS — Y9289 Other specified places as the place of occurrence of the external cause: Secondary | ICD-10-CM | POA: Insufficient documentation

## 2019-03-25 DIAGNOSIS — Z79899 Other long term (current) drug therapy: Secondary | ICD-10-CM | POA: Insufficient documentation

## 2019-03-25 DIAGNOSIS — Y9389 Activity, other specified: Secondary | ICD-10-CM | POA: Insufficient documentation

## 2019-03-25 DIAGNOSIS — Y990 Civilian activity done for income or pay: Secondary | ICD-10-CM | POA: Insufficient documentation

## 2019-03-25 DIAGNOSIS — F17210 Nicotine dependence, cigarettes, uncomplicated: Secondary | ICD-10-CM | POA: Diagnosis not present

## 2019-03-25 DIAGNOSIS — R200 Anesthesia of skin: Secondary | ICD-10-CM | POA: Diagnosis not present

## 2019-03-25 NOTE — ED Triage Notes (Signed)
Pt brought to ED by [facility] from work where he had a right hand injury, right thumb nail almost out with some lacerations on the side, pt c/o numbness on the other fingers of his hand, BP 156/100, HR 92, R-18, 98% RA  CBG 296. Dressing applied by EMS, bleeding controled.

## 2019-03-26 ENCOUNTER — Encounter (HOSPITAL_COMMUNITY): Payer: Self-pay | Admitting: Emergency Medicine

## 2019-03-26 ENCOUNTER — Emergency Department (HOSPITAL_COMMUNITY): Payer: No Typology Code available for payment source

## 2019-03-26 ENCOUNTER — Emergency Department (HOSPITAL_COMMUNITY)
Admission: EM | Admit: 2019-03-26 | Discharge: 2019-03-26 | Discharge status: Absent without leave | Payer: No Typology Code available for payment source | Source: Home / Self Care

## 2019-03-26 DIAGNOSIS — S62524B Nondisplaced fracture of distal phalanx of right thumb, initial encounter for open fracture: Secondary | ICD-10-CM | POA: Diagnosis not present

## 2019-03-26 MED ORDER — CEPHALEXIN 250 MG PO CAPS
500.0000 mg | ORAL_CAPSULE | Freq: Once | ORAL | Status: AC
  Administered 2019-03-26: 500 mg via ORAL
  Filled 2019-03-26: qty 2

## 2019-03-26 MED ORDER — TETANUS-DIPHTH-ACELL PERTUSSIS 5-2.5-18.5 LF-MCG/0.5 IM SUSP: 0.5000 mL | Freq: Once | INTRAMUSCULAR | Status: DC

## 2019-03-26 MED ORDER — BUPIVACAINE HCL (PF) 0.5 % IJ SOLN
10.0000 mL | Freq: Once | INTRAMUSCULAR | Status: AC
  Administered 2019-03-26: 10 mL
  Filled 2019-03-26: qty 10

## 2019-03-26 MED ORDER — OXYCODONE-ACETAMINOPHEN 5-325 MG PO TABS: 1.0000 | ORAL_TABLET | ORAL | 0 refills | Status: DC | PRN

## 2019-03-26 MED ORDER — CEPHALEXIN 500 MG PO CAPS: 500.0000 mg | ORAL_CAPSULE | Freq: Four times a day (QID) | ORAL | 0 refills | Status: DC

## 2019-03-26 NOTE — ED Triage Notes (Addendum)
Note made in error

## 2019-03-26 NOTE — ED Provider Notes (Signed)
[facility] EMERGENCY DEPARTMENT Provider Note   CSN: [id number] Arrival date & time: 03/25/19  2339   History Chief Complaint  Patient presents with  . Hand Injury    Ronald Pruitt. is a 49 y.o. male.  The history is provided by the patient.  Hand Injury He has history of hypertension, diabetes, hyperlipidemia, sleep apnea and comes in after having suffered a hand injury at work.  He states that his right thumb got caught in the machine that "would not let go".  He suffered an injury to the right thumbnail.  He is also noticing numbness in the tips of his index, long, ring fingers of that hand.  He does not know when his last tetanus immunization was.  Past Medical History:  Diagnosis Date  . Arthritis   . BPH (benign prostatic hyperplasia)   . Diabetes ([place])   . Dysuria   . GERD (gastroesophageal reflux disease)   . History of kidney stones   . HTN (hypertension)   . Hyperlipidemia   . Impotence   . Nocturia   . Obesity   . Pneumonia age 31's  . Sleep apnea    CPAP - severe (sleep study 01/11/16)  . Testicular hypofunction   . Wears dentures    full upper and lower    Patient Active Problem List   Diagnosis Date Noted  . Polyp of colon   . Diarrhea 03/31/2018  . Muscle cramping 03/31/2018  . Acute recurrent sinusitis 03/25/2017  . Fatty liver disease, nonalcoholic 09/32/6712  . HLD (hyperlipidemia) 08/26/2016  . Nocturia 08/18/2016  . DM (diabetes mellitus) ([place]) 08/18/2016  . HTN (hypertension) 08/18/2016  . OSA (obstructive sleep apnea) 08/18/2016  . Erectile dysfunction of organic origin 03/15/2015  . Hypogonadism in male 03/15/2015  . BPH with obstruction/lower urinary tract symptoms 03/15/2015    Past Surgical History:  Procedure Laterality Date  . CHOLECYSTECTOMY    . COLONOSCOPY WITH PROPOFOL N/A 10/12/2018   Procedure: COLONOSCOPY WITH PROPOFOL;  Surgeon: Lucilla Lame, MD;  Location: [facility] ENDOSCOPY;  Service: Endoscopy;   Laterality: N/A;  . NASAL SEPTOPLASTY W/ TURBINOPLASTY N/A 05/20/2017   Procedure: NASAL SEPTOPLASTY WITH SUBMUCOUS RESECTION;  Surgeon: Clyde Canterbury, MD;  Location: [facility] ORS;  Service: ENT;  Laterality: N/A;  . VASECTOMY         Family History  Problem Relation Age of Onset  . Hyperlipidemia Mother   . Diabetes Mellitus II Mother   . Seizures Mother   . Diabetes Mother   . Mental illness Mother   . Arthritis Mother   . Diabetes Father   . Cirrhosis Father        non alcoholic  . Hyperlipidemia Father   . Arthritis Father   . Liver cancer Father   . Diabetes Maternal Grandmother   . Hyperlipidemia Maternal Grandmother   . Arthritis Maternal Grandmother   . Diabetes Maternal Grandfather   . Hyperlipidemia Maternal Grandfather   . Arthritis Maternal Grandfather   . Diabetes Paternal Grandmother   . Hyperlipidemia Paternal Grandmother   . Arthritis Paternal Grandmother   . Diabetes Paternal Grandfather   . Hyperlipidemia Paternal Grandfather   . Arthritis Paternal Grandfather   . Kidney disease Neg Hx   . Prostate cancer Neg Hx   . Kidney cancer Neg Hx   . Bladder Cancer Neg Hx   . Colon cancer Neg Hx     Social History   Tobacco Use  . Smoking status: Current Every  Day Smoker    Packs/day: 0.50    Years: 20.00    Pack years: 10.00    Types: Cigarettes  . Smokeless tobacco: Never Used  Substance Use Topics  . Alcohol use: No    Alcohol/week: 0.0 standard drinks  . Drug use: Never    Home Medications Prior to Admission medications   Medication Sig Start Date End Date Taking? Authorizing Provider  amLODipine (NORVASC) 10 MG tablet Take 1 tablet by mouth once daily 02/16/19   Burnard Hawthorne, FNP  atorvastatin (LIPITOR) 40 MG tablet Take 1 tablet (40 mg total) by mouth daily. 09/21/17   Burnard Hawthorne, FNP  Blood Glucose Monitoring Suppl (ONETOUCH VERIO) w/Device KIT 1 Device by Does not apply route daily. 03/29/18   Burnard Hawthorne, FNP  cetirizine (EQ  ALLERGY RELIEF, CETIRIZINE,) 10 MG tablet Take 1 tablet (10 mg total) by mouth daily. 03/31/18   Burnard Hawthorne, FNP  doxycycline (VIBRAMYCIN) 100 MG capsule  05/27/18   [provider]  glucose blood test strip Use to check blood sugar up to 3 times daily 04/26/18   Crecencio Mc, MD  Insulin Glargine (LANTUS) 100 UNIT/ML Solostar Pen Inject 48 units into the skin daily. 10/19/18   Jodelle Green, FNP  Insulin Pen Needle (NOVOFINE) 30G X 8 MM MISC Use to inject insulin and Victoza each 1x per day E11.9 03/15/17   McLean-Scocuzza, Nino Glow, MD  lisinopril (ZESTRIL) 10 MG tablet Take 1 tablet by mouth once daily 02/16/19   Burnard Hawthorne, FNP  metFORMIN (GLUCOPHAGE-XR) 500 MG 24 hr tablet Take 2 tablets (1,000 mg total) by mouth 2 (two) times daily. 10/11/18   Burnard Hawthorne, FNP  omeprazole (PRILOSEC) 20 MG capsule Take 1 capsule by mouth once daily 02/07/19   Burnard Hawthorne, FNP  Semaglutide,0.25 or 0.5MG/DOS, (OZEMPIC, 0.25 OR 0.5 MG/DOSE,) 2 MG/1.5ML SOPN Inject 0.25 mg into the skin once a week. 02/24/19   Burnard Hawthorne, FNP  tadalafil (CIALIS) 20 MG tablet TAKE 1 TABLET BY MOUTH ONCE DAILY AS NEEDED FOR ERECTILE DYSFUNCTION 03/11/19   Burnard Hawthorne, FNP    Allergies    Patient has no known allergies.  Review of Systems   Review of Systems  All other systems reviewed and are negative.   Physical Exam Updated Vital Signs BP (!) 130/93   Pulse 92   Temp 98 F (36.7 C) (Oral)   Resp 18   SpO2 96%   Physical Exam Vitals and nursing note reviewed.   49 year old male, resting comfortably and in no acute distress. Vital signs are significant for elevated diastolic blood pressure. Oxygen saturation is 96%, which is normal. Head is normocephalic and atraumatic. PERRLA, EOMI. Oropharynx is clear. Neck is nontender and supple without adenopathy or JVD. Back is nontender and there is no CVA tenderness. Lungs are clear without rales, wheezes, or rhonchi. Chest is  nontender. Heart has regular rate and rhythm without murmur. Abdomen is soft, flat, nontender without masses or hepatosplenomegaly and peristalsis is normoactive. Extremities: Nail of the right thumb is almost completely evulsed, only being held on by connection to the radial aspect of the nailbed. Skin is warm and dry without rash. Neurologic: Mental status is normal, cranial nerves are intact, there are no motor or sensory deficits.   ED Results / Procedures / Treatments    Radiology DG Hand Complete Right  Result Date: 03/26/2019 CLINICAL DATA:  Right hand injury, lacerations EXAM: RIGHT  HAND - COMPLETE 3+ VIEW COMPARISON:  None. FINDINGS: Frontal, oblique, lateral views of the right hand are obtained. There is a comminuted open fracture of the distal tuft of the first distal phalanx. No other acute displaced fractures. No radiopaque foreign bodies. Alignment is anatomic. IMPRESSION: 1. Open comminuted fracture distal tuft first distal phalanx. Electronically Signed   By: Randa Ngo M.D.   On: 03/26/2019 00:24    Procedures .Nerve Block  Date/Time: 03/26/2019 2:12 AM Performed by: Delora Fuel, MD Authorized by: Delora Fuel, MD   Consent:    Consent obtained:  Verbal   Consent given by:  Patient   Risks discussed:  Allergic reaction, infection and nerve damage     Medications Ordered in ED Medications  bupivacaine (MARCAINE) 0.5 % injection 10 mL (10 mLs Infiltration Given 03/26/19 0053)  cephALEXin (KEFLEX) capsule 500 mg (500 mg Oral Given 03/26/19 0054)    ED Course  I have reviewed the triage vital signs and the nursing notes.  Pertinent imaging results that were available during my care of the patient were reviewed by me and considered in my medical decision making (see chart for details).  MDM Rules/Calculators/A&P Crush injury of right thumb with near complete avulsion of the thumbnail.  X-rays show comminuted fracture of the distal phalanx of the thumb.  Old  records are reviewed showing Tdap immunization given 04/22/2017, so he does not need to have that updated.    Digital block is placed with bupivacaine.  Nail is removed by Jarrett Soho Muthersbaugh PA-C.  He is placed in a finger splint.  He is referred to Dr. Lenon Curt of hand surgery service for outpatient management.  He is discharged with prescriptions for cephalexin and oxycodone-acetaminophen.  Case and plan were discussed with Dr. Freada Bergeron prior to discharge.  Final Clinical Impression(s) / ED Diagnoses Final diagnoses:  Open nondisplaced fracture of distal phalanx of right thumb, initial encounter    Rx / DC Orders ED Discharge Orders         Ordered    cephALEXin (KEFLEX) 500 MG capsule  4 times daily     03/26/19 0306    oxyCODONE-acetaminophen (PERCOCET) 5-325 MG tablet  Every 4 hours PRN     03/26/19 6484           Delora Fuel, MD 72/07/21 [phone]

## 2019-03-26 NOTE — ED Notes (Signed)
Pt returned from X-ray.  

## 2019-03-26 NOTE — ED Provider Notes (Signed)
Irrigation and debridement  Date/Time: 03/26/2019 3:50 AM Performed by: Abigail Butts, PA-C Authorized by: Abigail Butts, PA-C  Consent: Verbal consent obtained. Risks and benefits: risks, benefits and alternatives were discussed Consent given by: patient Required items: required blood products, implants, devices, and special equipment available Patient identity confirmed: verbally with patient Time out: Immediately prior to procedure a "time out" was called to verify the correct patient, procedure, equipment, support staff and site/side marked as required. Preparation: Patient was prepped and draped in the usual sterile fashion. Local anesthesia used: yes Anesthesia: nerve block (Nerve Block by Dr. Roxanne Mins)  Anesthesia: Local anesthesia used: yes Local Anesthetic: bupivacaine 0.5% with epinephrine  Sedation: Patient sedated: no  Patient tolerance: patient tolerated the procedure well with no immediate complications  .Nail Removal  Date/Time: 03/26/2019 3:51 AM Performed by: Abigail Butts, PA-C Authorized by: Abigail Butts, PA-C   Consent:    Consent obtained:  Verbal   Consent given by:  Patient   Risks discussed:  Bleeding, incomplete removal, infection, pain and permanent nail deformity   Alternatives discussed:  No treatment Location:    Hand:  R thumb Anesthesia (see MAR for exact dosages):    Anesthesia method:  Nerve block (Nerve Block by Dr. Roxanne Mins) Nail Removal:    Nail removed:  Complete   Nail bed repaired: no     Removed nail replaced and anchored: no   Post-procedure details:    Dressing:  Petrolatum-impregnated gauze   Patient tolerance of procedure:  Tolerated well, no immediate complications Comments:     Dr. Roxanne Mins Discussed with Dr. Lenon Curt who recommends bandage and follow-up outpatient          Domique Clapper, Gwenlyn Perking [phone]    Delora Fuel, MD [phone]

## 2019-03-26 NOTE — ED Notes (Signed)
Patient transported to X-ray 

## 2019-03-26 NOTE — Discharge Instructions (Addendum)
Keep your hand elevated. Apply ice as needed.  Wear the splint at all times.  Take ibuprofen or naproxen as needed for less severe pain.  Call the hand specialist on Monday for an appointment as soon as possible.

## 2019-03-31 DIAGNOSIS — M10061 Idiopathic gout, right knee: Secondary | ICD-10-CM | POA: Diagnosis not present

## 2019-04-01 ENCOUNTER — Encounter: Payer: Self-pay | Admitting: Family

## 2019-04-01 ENCOUNTER — Other Ambulatory Visit: Payer: Self-pay

## 2019-04-01 ENCOUNTER — Ambulatory Visit (INDEPENDENT_AMBULATORY_CARE_PROVIDER_SITE_OTHER): Payer: BC Managed Care – PPO | Admitting: Family

## 2019-04-01 VITALS — BP 122/90 | HR 99 | Temp 98.0°F | Ht 72.95 in | Wt 229.4 lb

## 2019-04-01 DIAGNOSIS — E785 Hyperlipidemia, unspecified: Secondary | ICD-10-CM

## 2019-04-01 DIAGNOSIS — I10 Essential (primary) hypertension: Secondary | ICD-10-CM | POA: Diagnosis not present

## 2019-04-01 DIAGNOSIS — R5383 Other fatigue: Secondary | ICD-10-CM | POA: Insufficient documentation

## 2019-04-01 DIAGNOSIS — Z23 Encounter for immunization: Secondary | ICD-10-CM | POA: Diagnosis not present

## 2019-04-01 DIAGNOSIS — R200 Anesthesia of skin: Secondary | ICD-10-CM

## 2019-04-01 DIAGNOSIS — E119 Type 2 diabetes mellitus without complications: Secondary | ICD-10-CM

## 2019-04-01 DIAGNOSIS — N138 Other obstructive and reflux uropathy: Secondary | ICD-10-CM

## 2019-04-01 DIAGNOSIS — N401 Enlarged prostate with lower urinary tract symptoms: Secondary | ICD-10-CM

## 2019-04-01 LAB — LIPID PANEL
Cholesterol: 189 mg/dL (ref 0–200)
HDL: 33.7 mg/dL — ABNORMAL LOW (ref >39.00)
NonHDL: 155.76
Total CHOL/HDL Ratio: 6
Triglycerides: 264 mg/dL — ABNORMAL HIGH (ref 0.0–149.0)
VLDL: 52.8 mg/dL — ABNORMAL HIGH (ref 0.0–40.0)

## 2019-04-01 LAB — CBC WITH DIFFERENTIAL/PLATELET
Basophils Absolute: 0.1 10*3/uL (ref 0.0–0.1)
Basophils Relative: 0.7 % (ref 0.0–3.0)
Eosinophils Absolute: 0.1 10*3/uL (ref 0.0–0.7)
Eosinophils Relative: 1.1 % (ref 0.0–5.0)
HCT: 47.4 % (ref 39.0–52.0)
Hemoglobin: 15.8 g/dL (ref 13.0–17.0)
Lymphocytes Relative: 18.3 % (ref 12.0–46.0)
Lymphs Abs: 2.4 10*3/uL (ref 0.7–4.0)
MCHC: 33.3 g/dL (ref 30.0–36.0)
MCV: 88.8 fl (ref 78.0–100.0)
Monocytes Absolute: 0.9 10*3/uL (ref 0.1–1.0)
Monocytes Relative: 6.7 % (ref 3.0–12.0)
Neutro Abs: 9.5 10*3/uL — ABNORMAL HIGH (ref 1.4–7.7)
Neutrophils Relative %: 73.2 % (ref 43.0–77.0)
Platelets: 215 10*3/uL (ref 150.0–400.0)
RBC: 5.34 Mil/uL (ref 4.22–5.81)
RDW: 14.5 % (ref 11.5–15.5)
WBC: 13 10*3/uL — ABNORMAL HIGH (ref 4.0–10.5)

## 2019-04-01 LAB — COMPREHENSIVE METABOLIC PANEL
ALT: 25 U/L (ref 0–53)
AST: 13 U/L (ref 0–37)
Albumin: 4.1 g/dL (ref 3.5–5.2)
Alkaline Phosphatase: 81 U/L (ref 39–117)
BUN: 21 mg/dL (ref 6–23)
CO2: 24 mEq/L (ref 19–32)
Calcium: 9.8 mg/dL (ref 8.4–10.5)
Chloride: 102 mEq/L (ref 96–112)
Creatinine, Ser: 0.85 mg/dL (ref 0.40–1.50)
GFR: 95.1 mL/min (ref >60.00)
Glucose, Bld: 132 mg/dL — ABNORMAL HIGH (ref 70–99)
Potassium: 4.2 mEq/L (ref 3.5–5.1)
Sodium: 135 mEq/L (ref 135–145)
Total Bilirubin: 0.4 mg/dL (ref 0.2–1.2)
Total Protein: 6.9 g/dL (ref 6.0–8.3)

## 2019-04-01 LAB — PSA: PSA: 0.26 ng/mL (ref 0.10–4.00)

## 2019-04-01 LAB — B12 AND FOLATE PANEL
Folate: 3.9 ng/mL — ABNORMAL LOW (ref >5.9)
Vitamin B-12: 180 pg/mL — ABNORMAL LOW (ref 211–911)

## 2019-04-01 LAB — LDL CHOLESTEROL, DIRECT: Direct LDL: 133 mg/dL

## 2019-04-01 LAB — TSH: TSH: 1.04 u[IU]/mL (ref 0.35–4.50)

## 2019-04-01 LAB — HEMOGLOBIN A1C: Hgb A1c MFr Bld: 8.9 % — ABNORMAL HIGH (ref 4.6–6.5)

## 2019-04-01 MED ORDER — ATORVASTATIN CALCIUM 40 MG PO TABS: 40.0000 mg | ORAL_TABLET | Freq: Every day | ORAL | 1 refills | Status: DC

## 2019-04-01 NOTE — Assessment & Plan Note (Signed)
New, largely unchanged. Patient had notable neuropathy with microfilament exam on right foot. No obvious signs of PAD however both of his feet are slightly cool to the touch. Pending lab evaluation however suspect symptoms most likely related to diabetes. I placed a referral to vascular for further evaluation, ultrasound studies to ensure blood flow is adequate.

## 2019-04-01 NOTE — Assessment & Plan Note (Addendum)
Improved on Ozempic. Discussed with patient that I would a more oral regimen versus is a high dose of Lantus. Patient had not any hypoglycemic episodes. Pending a1c and close follow up to consider changes to therapy. Of note referral to podiatry for  onychomycosis

## 2019-04-01 NOTE — Assessment & Plan Note (Signed)
At goal, continue current regimen 

## 2019-04-01 NOTE — Patient Instructions (Signed)
Referral to podiatry, vascular. Let us know if you not get these appointments  Please follow-up with me closely in 3 months  Stay safe!

## 2019-04-01 NOTE — Progress Notes (Signed)
Subjective:    Patient ID: Ronald Karvonen., male    DOB: Oct 11, 1970, 49 y.o.   MRN: [id number]  CC: Ronald Docken. is a 49 y.o. male who presents today for follow up.   HPI: Follow up   Here today as having numbness around right big toe, with numbness under the foot.  Both feet stay cold. Symptoms started 6 months ago. Some days worse than others. Notices it more at night when sleeping.  No wounds, erythema, increased heat. No back pain, falls. No trouble with balance. No pain in calves with walking. No leg swelling.  Wears steel toe shoes at work.  Also notes a lot of stress in his family of late. He is caring for his mother. Recent deaths in the family. Injuries at work particularly his thumb have been frustrating for him as well as  changes in jobs.  DM- prior to starting ozempic, blood sugar in the afternoon had been 200. Now running 132, 95, 86; some of the numbers are fastings, others are after eating several hours late.  No hypoglycemic. Had a round of prednisone with elevation of glucose 2 weeks ago, since returned to baseline. On lantus 48 units.    HTN- compliant with amlodipine, lisinopril.  Denies exertional chest pain or pressure, numbness or tingling radiating to left arm or jaw, palpitations, dizziness, frequent headaches, changes in vision, or shortness of breath.      03/25/19-emergency room for right nondisplaced fracture of the thumb.  Discharged on Keflex and oxycodone acetaminophen.  Has seen orthopedic hand specialist yesterday. No concern infection. Per patient,orthopedic was pleased . He is doing dressing changes tiwce per day. Does he numbness in right thumb intermittently. He is limited the oxycodone acetiminophen.  HISTORY:  Past Medical History:  Diagnosis Date  . Arthritis   . BPH (benign prostatic hyperplasia)   . Diabetes ([place])   . Dysuria   . GERD (gastroesophageal reflux disease)   . History of kidney stones   . HTN (hypertension)   .  Hyperlipidemia   . Impotence   . Nocturia   . Obesity   . Pneumonia age 64's  . Sleep apnea    CPAP - severe (sleep study 01/11/16)  . Testicular hypofunction   . Wears dentures    full upper and lower   Past Surgical History:  Procedure Laterality Date  . CHOLECYSTECTOMY    . COLONOSCOPY WITH PROPOFOL N/A 10/12/2018   Procedure: COLONOSCOPY WITH PROPOFOL;  Surgeon: Lucilla Lame, MD;  Location: [facility] ENDOSCOPY;  Service: Endoscopy;  Laterality: N/A;  . NASAL SEPTOPLASTY W/ TURBINOPLASTY N/A 05/20/2017   Procedure: NASAL SEPTOPLASTY WITH SUBMUCOUS RESECTION;  Surgeon: Clyde Canterbury, MD;  Location: [facility] ORS;  Service: ENT;  Laterality: N/A;  . VASECTOMY     Family History  Problem Relation Age of Onset  . Hyperlipidemia Mother   . Diabetes Mellitus II Mother   . Seizures Mother   . Diabetes Mother   . Mental illness Mother   . Arthritis Mother   . Diabetes Father   . Cirrhosis Father        non alcoholic  . Hyperlipidemia Father   . Arthritis Father   . Liver cancer Father   . Diabetes Maternal Grandmother   . Hyperlipidemia Maternal Grandmother   . Arthritis Maternal Grandmother   . Diabetes Maternal Grandfather   . Hyperlipidemia Maternal Grandfather   . Arthritis Maternal Grandfather   . Diabetes Paternal Grandmother   . Hyperlipidemia  Paternal Grandmother   . Arthritis Paternal Grandmother   . Diabetes Paternal Grandfather   . Hyperlipidemia Paternal Grandfather   . Arthritis Paternal Grandfather   . Kidney disease Neg Hx   . Prostate cancer Neg Hx   . Kidney cancer Neg Hx   . Bladder Cancer Neg Hx   . Colon cancer Neg Hx     Allergies: Patient has no known allergies. Current Outpatient Medications on File Prior to Visit  Medication Sig Dispense Refill  . amLODipine (NORVASC) 10 MG tablet Take 1 tablet by mouth once daily 90 tablet 0  . Blood Glucose Monitoring Suppl (ONETOUCH VERIO) w/Device KIT 1 Device by Does not apply route daily. 1 kit 0  . cephALEXin  (KEFLEX) 500 MG capsule Take 1 capsule (500 mg total) by mouth 4 (four) times daily. 20 capsule 0  . cetirizine (EQ ALLERGY RELIEF, CETIRIZINE,) 10 MG tablet Take 1 tablet (10 mg total) by mouth daily. 90 tablet 1  . glucose blood test strip Use to check blood sugar up to 3 times daily 300 each 3  . Insulin Glargine (LANTUS) 100 UNIT/ML Solostar Pen Inject 48 units into the skin daily. 54 mL 11  . Insulin Pen Needle (NOVOFINE) 30G X 8 MM MISC Use to inject insulin and Victoza each 1x per day E11.9 180 each 3  . lisinopril (ZESTRIL) 10 MG tablet Take 1 tablet by mouth once daily 90 tablet 0  . metFORMIN (GLUCOPHAGE-XR) 500 MG 24 hr tablet Take 2 tablets (1,000 mg total) by mouth 2 (two) times daily. 360 tablet 1  . omeprazole (PRILOSEC) 20 MG capsule Take 1 capsule by mouth once daily 90 capsule 1  . oxyCODONE-acetaminophen (PERCOCET) 5-325 MG tablet Take 1 tablet by mouth every 4 (four) hours as needed for moderate pain. 15 tablet 0  . Semaglutide,0.25 or 0.5MG/DOS, (OZEMPIC, 0.25 OR 0.5 MG/DOSE,) 2 MG/1.5ML SOPN Inject 0.25 mg into the skin once a week. 1 pen 3  . tadalafil (CIALIS) 20 MG tablet TAKE 1 TABLET BY MOUTH ONCE DAILY AS NEEDED FOR ERECTILE DYSFUNCTION 12 tablet 0   No current facility-administered medications on file prior to visit.    Social History   Tobacco Use  . Smoking status: Current Every Day Smoker    Packs/day: 0.50    Years: 20.00    Pack years: 10.00    Types: Cigarettes  . Smokeless tobacco: Never Used  Substance Use Topics  . Alcohol use: No    Alcohol/week: 0.0 standard drinks  . Drug use: Never    Review of Systems  Constitutional: Negative for chills and fever.  Respiratory: Negative for cough.   Cardiovascular: Negative for chest pain and palpitations.  Gastrointestinal: Negative for nausea and vomiting.  Musculoskeletal: Positive for arthralgias (right thumb).  Skin: Positive for wound. Negative for color change and rash.  Neurological: Positive  for numbness.      Objective:    BP 122/90   Pulse (!) 109   Temp 98 F (36.7 C) (Temporal)   Ht 6' 0.95" (1.853 m)   Wt 229 lb 6.4 oz (104.1 kg)   SpO2 98%   BMI 30.31 kg/m  BP Readings from Last 3 Encounters:  04/01/19 122/90  03/26/19 125/85  03/18/19 (!) 135/99   Wt Readings from Last 3 Encounters:  04/01/19 229 lb 6.4 oz (104.1 kg)  03/18/19 235 lb (106.6 kg)  10/12/18 240 lb (108.9 kg)    Physical Exam Vitals reviewed.  Constitutional:  Appearance: He is well-developed.  Cardiovascular:     Rate and Rhythm: Regular rhythm.     Heart sounds: Normal heart sounds.     Comments: Bilateral feet are slightly cold to touch however easily palpated pedal pulses. No blotching or changes to skin color. No wounds.No hair growth. Pulmonary:     Effort: Pulmonary effort is normal. No respiratory distress.     Breath sounds: Normal breath sounds. No wheezing, rhonchi or rales.  Feet:     Comments: Thickened discolored nail bed of great toe, left foot.  Skin:    General: Skin is warm and dry.  Neurological:     Mental Status: He is alert.  Psychiatric:        Speech: Speech normal.        Behavior: Behavior normal.        Assessment & Plan:   Problem List Items Addressed This Visit    None       I am having Okey Regal. Patsy [place]. maintain his Insulin Pen Needle, OneTouch Verio, cetirizine, glucose blood, metFORMIN, insulin glargine, omeprazole, amLODipine, lisinopril, Ozempic (0.25 or 0.5 MG/DOSE), tadalafil, cephALEXin, and oxyCODONE-acetaminophen.   No orders of the defined types were placed in this encounter.   Return precautions given.   Risks, benefits, and alternatives of the medications and treatment plan prescribed today were discussed, and patient expressed understanding.   Education regarding symptom management and diagnosis given to patient on AVS.  Continue to follow with Burnard Hawthorne, FNP for routine health maintenance.   Ronald Karvonen. and I agreed with plan.   Mable Paris, FNP

## 2019-04-01 NOTE — Assessment & Plan Note (Signed)
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 Not sleeping well due to pain in his right thumb. Patient politely declines any medications as he does not feel he is depressed. We will certainly follow this at follow-up

## 2019-04-01 NOTE — Addendum Note (Signed)
Addended by: Dorna Leitz on: 04/01/2019 10:22 AM   Modules accepted: Orders

## 2019-04-04 ENCOUNTER — Other Ambulatory Visit: Payer: Self-pay | Admitting: Family

## 2019-04-04 DIAGNOSIS — R899 Unspecified abnormal finding in specimens from other organs, systems and tissues: Secondary | ICD-10-CM

## 2019-04-04 DIAGNOSIS — R200 Anesthesia of skin: Secondary | ICD-10-CM

## 2019-04-04 MED ORDER — FOLIC ACID 1 MG PO TABS: 1.0000 mg | ORAL_TABLET | Freq: Every day | ORAL | 3 refills | Status: DC

## 2019-04-04 MED ORDER — B-12 1000 MCG PO CAPS: 1.0000 | ORAL_CAPSULE | Freq: Every day | ORAL | 5 refills | Status: AC

## 2019-04-05 ENCOUNTER — Other Ambulatory Visit: Payer: Self-pay | Admitting: Family

## 2019-04-05 ENCOUNTER — Encounter: Payer: Self-pay | Admitting: Family

## 2019-04-05 DIAGNOSIS — E785 Hyperlipidemia, unspecified: Secondary | ICD-10-CM

## 2019-04-05 MED ORDER — ATORVASTATIN CALCIUM 80 MG PO TABS: 80.0000 mg | ORAL_TABLET | Freq: Every day | ORAL | 1 refills | Status: DC

## 2019-04-06 ENCOUNTER — Other Ambulatory Visit: Payer: Self-pay | Admitting: Family

## 2019-04-06 DIAGNOSIS — E291 Testicular hypofunction: Secondary | ICD-10-CM

## 2019-04-12 ENCOUNTER — Other Ambulatory Visit: Payer: Self-pay

## 2019-04-12 ENCOUNTER — Telehealth: Payer: Self-pay | Admitting: Family

## 2019-04-12 ENCOUNTER — Ambulatory Visit (INDEPENDENT_AMBULATORY_CARE_PROVIDER_SITE_OTHER): Payer: BC Managed Care – PPO | Admitting: Vascular Surgery

## 2019-04-12 ENCOUNTER — Encounter (INDEPENDENT_AMBULATORY_CARE_PROVIDER_SITE_OTHER): Payer: Self-pay | Admitting: Vascular Surgery

## 2019-04-12 VITALS — BP 149/99 | HR 92 | Resp 16 | Ht 73.0 in | Wt 230.0 lb

## 2019-04-12 DIAGNOSIS — R200 Anesthesia of skin: Secondary | ICD-10-CM | POA: Diagnosis not present

## 2019-04-12 DIAGNOSIS — I10 Essential (primary) hypertension: Secondary | ICD-10-CM | POA: Diagnosis not present

## 2019-04-12 DIAGNOSIS — E785 Hyperlipidemia, unspecified: Secondary | ICD-10-CM

## 2019-04-12 DIAGNOSIS — E1140 Type 2 diabetes mellitus with diabetic neuropathy, unspecified: Secondary | ICD-10-CM | POA: Diagnosis not present

## 2019-04-12 NOTE — Patient Instructions (Signed)
Peripheral Vascular Disease  Peripheral vascular disease (PVD) is a disease of the blood vessels that are not part of your heart and brain. A simple term for PVD is poor circulation. In most cases, PVD narrows the blood vessels that carry blood from your heart to the rest of your body. This can reduce the supply of blood to your arms, legs, and internal organs, like your stomach or kidneys. However, PVD most often affects a person's lower legs and feet. Without treatment, PVD tends to get worse. PVD can also lead to acute ischemic limb. This is when an arm or leg suddenly cannot get enough blood. This is a medical emergency. Follow these instructions at home: Lifestyle  Do not use any products that contain nicotine or tobacco, such as cigarettes and e-cigarettes. If you need help quitting, ask your doctor.  Lose weight if you are overweight. Or, stay at a healthy weight as told by your doctor.  Eat a diet that is low in fat and cholesterol. If you need help, ask your doctor.  Exercise regularly. Ask your doctor for activities that are right for you. General instructions  Take over-the-counter and prescription medicines only as told by your doctor.  Take good care of your feet: ? Wear comfortable shoes that fit well. ? Check your feet often for any cuts or sores.  Keep all follow-up visits as told by your doctor This is important. Contact a doctor if:  You have cramps in your legs when you walk.  You have leg pain when you are at rest.  You have coldness in a leg or foot.  Your skin changes.  You are unable to get or have an erection (erectile dysfunction).  You have cuts or sores on your feet that do not heal. Get help right away if:  Your arm or leg turns cold, numb, and blue.  Your arms or legs become red, warm, swollen, painful, or numb.  You have chest pain.  You have trouble breathing.  You suddenly have weakness in your face, arm, or leg.  You become very  confused or you cannot speak.  You suddenly have a very bad headache.  You suddenly cannot see. Summary  Peripheral vascular disease (PVD) is a disease of the blood vessels.  A simple term for PVD is poor circulation. Without treatment, PVD tends to get worse.  Treatment may include exercise, low fat and low cholesterol diet, and quitting smoking. This information is not intended to replace advice given to you by your health care provider. Make sure you discuss any questions you have with your health care provider. Document Revised: 12/26/2016 Document Reviewed: 02/21/2016 Elsevier Patient Education  2020 [employer]  

## 2019-04-12 NOTE — Telephone Encounter (Signed)
Call pt  Advise f/u .  Reviewed note from vascular today Dr. Lucky Cowboy. He was concerned about low back pain contributing to leg pain.  We can certainly look at his back in the office, get x-rays     Dr Lucky Cowboy   suspect the patient is c/o pseudoclaudication.  Patient should have an evaluation of his LS spine which I defer to the primary service.

## 2019-04-12 NOTE — Assessment & Plan Note (Signed)
Recommend:  The patient has atypical pain symptoms for pure atherosclerotic disease. However, on physical exam there is evidence of mixed venous and arterial disease, given the diminished pulses and the edema associated with venous changes of the legs.  Noninvasive studies including ABI's and venous ultrasound of the legs will be obtained and the patient will follow up with me to review these studies.  I suspect the patient is c/o pseudoclaudication.  Patient should have an evaluation of his LS spine which I defer to the primary service.  The patient should continue walking and begin a more formal exercise program. The patient should continue his antiplatelet therapy and aggressive treatment of the lipid abnormalities.  The patient should begin wearing graduated compression socks 15-20 mmHg strength to control edema.  

## 2019-04-12 NOTE — Assessment & Plan Note (Signed)
lipid control important in reducing the progression of atherosclerotic disease. Continue statin therapy  

## 2019-04-12 NOTE — Assessment & Plan Note (Signed)
This is the most likely cause of his lower extremity numbness, but also be a risk factor for arterial disease. blood glucose control important in reducing the progression of atherosclerotic disease. Also, involved in wound healing. On appropriate medications.

## 2019-04-12 NOTE — Assessment & Plan Note (Signed)
blood pressure control important in reducing the progression of atherosclerotic disease. On appropriate oral medications.  

## 2019-04-12 NOTE — Progress Notes (Signed)
Patient ID: Ronald Karvonen., male   DOB: 1970/10/21, 49 y.o.   MRN: [id number]  Chief Complaint  Patient presents with  . New Patient (Initial Visit)    ref Arnett distal numbness right foot    HPI Ronald Pruitt. is a 49 y.o. male.  I am asked to see the patient by M. Arnett, NP for evaluation of numbness in the feet.  Symptoms are predominantly on the right.  There was no clear inciting event or causative factor that started the symptoms.  This has been going on for about 6 months or so.  No open wounds or ulceration.  The patient does not describe typical claudication symptoms or ulceration.  He does have multiple atherosclerotic risk factors including hypertension, diabetes, and tobacco use.  He says he has very minimal symptoms on the left leg.  This is mostly numbness in the forefoot and the great toe area.  Nothing is really made this better.     Past Medical History:  Diagnosis Date  . Arthritis   . BPH (benign prostatic hyperplasia)   . Diabetes ([place])   . Dysuria   . GERD (gastroesophageal reflux disease)   . History of kidney stones   . HTN (hypertension)   . Hyperlipidemia   . Impotence   . Nocturia   . Obesity   . Pneumonia age 47's  . Sleep apnea    CPAP - severe (sleep study 01/11/16)  . Testicular hypofunction   . Wears dentures    full upper and lower    Past Surgical History:  Procedure Laterality Date  . CHOLECYSTECTOMY    . COLONOSCOPY WITH PROPOFOL N/A 10/12/2018   Procedure: COLONOSCOPY WITH PROPOFOL;  Surgeon: Lucilla Lame, MD;  Location: [facility] ENDOSCOPY;  Service: Endoscopy;  Laterality: N/A;  . NASAL SEPTOPLASTY W/ TURBINOPLASTY N/A 05/20/2017   Procedure: NASAL SEPTOPLASTY WITH SUBMUCOUS RESECTION;  Surgeon: Clyde Canterbury, MD;  Location: [facility] ORS;  Service: ENT;  Laterality: N/A;  . VASECTOMY       Family History  Problem Relation Age of Onset  . Hyperlipidemia Mother   . Diabetes Mellitus II Mother   . Seizures Mother   . Diabetes  Mother   . Mental illness Mother   . Arthritis Mother   . Diabetes Father   . Cirrhosis Father        non alcoholic  . Hyperlipidemia Father   . Arthritis Father   . Liver cancer Father   . Diabetes Maternal Grandmother   . Hyperlipidemia Maternal Grandmother   . Arthritis Maternal Grandmother   . Diabetes Maternal Grandfather   . Hyperlipidemia Maternal Grandfather   . Arthritis Maternal Grandfather   . Diabetes Paternal Grandmother   . Hyperlipidemia Paternal Grandmother   . Arthritis Paternal Grandmother   . Diabetes Paternal Grandfather   . Hyperlipidemia Paternal Grandfather   . Arthritis Paternal Grandfather   . Kidney disease Neg Hx   . Prostate cancer Neg Hx   . Kidney cancer Neg Hx   . Bladder Cancer Neg Hx   . Colon cancer Neg Hx   . Thyroid cancer Neg Hx     Social History   Tobacco Use  . Smoking status: Current Every Day Smoker    Packs/day: 0.50    Years: 20.00    Pack years: 10.00    Types: Cigarettes  . Smokeless tobacco: Never Used  Substance Use Topics  . Alcohol use: No    Alcohol/week: 0.0 standard  drinks  . Drug use: Never     No Known Allergies  Current Outpatient Medications  Medication Sig Dispense Refill  . amLODipine (NORVASC) 10 MG tablet Take 1 tablet by mouth once daily 90 tablet 0  . atorvastatin (LIPITOR) 80 MG tablet Take 1 tablet (80 mg total) by mouth daily at 6 PM. 90 tablet 1  . Blood Glucose Monitoring Suppl (ONETOUCH VERIO) w/Device KIT 1 Device by Does not apply route daily. 1 kit 0  . cephALEXin (KEFLEX) 500 MG capsule Take 1 capsule (500 mg total) by mouth 4 (four) times daily. 20 capsule 0  . cetirizine (EQ ALLERGY RELIEF, CETIRIZINE,) 10 MG tablet Take 1 tablet (10 mg total) by mouth daily. 90 tablet 1  . Cyanocobalamin (B-12) 1000 MCG CAPS Take 1 capsule by mouth daily. 90 capsule 5  . folic acid (FOLVITE) 1 MG tablet Take 1 tablet (1 mg total) by mouth daily. 90 tablet 3  . glucose blood test strip Use to check blood  sugar up to 3 times daily 300 each 3  . Insulin Glargine (LANTUS) 100 UNIT/ML Solostar Pen Inject 48 units into the skin daily. 54 mL 11  . Insulin Pen Needle (NOVOFINE) 30G X 8 MM MISC Use to inject insulin and Victoza each 1x per day E11.9 180 each 3  . lisinopril (ZESTRIL) 10 MG tablet Take 1 tablet by mouth once daily 90 tablet 0  . metFORMIN (GLUCOPHAGE-XR) 500 MG 24 hr tablet Take 2 tablets (1,000 mg total) by mouth 2 (two) times daily. 360 tablet 1  . omeprazole (PRILOSEC) 20 MG capsule Take 1 capsule by mouth once daily 90 capsule 1  . oxyCODONE-acetaminophen (PERCOCET) 5-325 MG tablet Take 1 tablet by mouth every 4 (four) hours as needed for moderate Pruitt. 15 tablet 0  . Semaglutide,0.25 or 0.5MG/DOS, (OZEMPIC, 0.25 OR 0.5 MG/DOSE,) 2 MG/1.5ML SOPN Inject 0.25 mg into the skin once a week. 1 pen 3  . tadalafil (CIALIS) 20 MG tablet TAKE 1 TABLET BY MOUTH ONCE DAILY AS NEEDED FOR ERECTILE DYSFUNCTION 12 tablet 0   No current facility-administered medications for this visit.      REVIEW OF SYSTEMS (Negative unless checked)  Constitutional: '[]' Weight loss  '[]' Fever  '[]' Chills Cardiac: '[]' Chest Pruitt   '[]' Chest pressure   '[]' Palpitations   '[]' Shortness of breath when laying flat   '[]' Shortness of breath at rest   '[]' Shortness of breath with exertion. Vascular:  '[]' Pruitt in legs with walking   '[]' Pruitt in legs at rest   '[]' Pruitt in legs when laying flat   '[]' Claudication   '[]' Pruitt in feet when walking  '[]' Pruitt in feet at rest  '[]' Pruitt in feet when laying flat   '[]' History of DVT   '[]' Phlebitis   '[]' Swelling in legs   '[]' Varicose veins   '[]' Non-healing ulcers Pulmonary:   '[]' Uses home oxygen   '[]' Productive cough   '[]' Hemoptysis   '[]' Wheeze  '[]' COPD   '[]' Asthma Neurologic:  '[]' Dizziness  '[]' Blackouts   '[]' Seizures   '[]' History of stroke   '[]' History of TIA  '[]' Aphasia   '[]' Temporary blindness   '[]' Dysphagia   '[]' Weakness or numbness in arms   '[x]' Weakness or numbness in legs Musculoskeletal:  '[x]' Arthritis   '[]' Joint swelling    '[x]' Joint Pruitt   '[]' Low back Pruitt Hematologic:  '[]' Easy bruising  '[]' Easy bleeding   '[]' Hypercoagulable state   '[]' Anemic  '[]' Hepatitis Gastrointestinal:  '[]' Blood in stool   '[]' Vomiting blood  '[x]' Gastroesophageal reflux/heartburn   '[]' Abdominal Pruitt Genitourinary:  '[]' Chronic kidney disease   '[]' Difficult  urination  '[]' Frequent urination  '[]' Burning with urination   '[]' Hematuria Skin:  '[]' Rashes   '[]' Ulcers   '[]' Wounds Psychological:  '[]' History of anxiety   '[]'  History of major depression.    Physical Exam BP (!) 149/99 (BP Location: Right Arm)   Pulse 92   Resp 16   Ht '6\' 1"'  (1.854 m)   Wt 230 lb (104.3 kg)   BMI 30.34 kg/m  Gen:  WD/WN, NAD Head: [place]/AT, No temporalis wasting.  Ear/Nose/Throat: Hearing grossly intact, nares w/o erythema or drainage, oropharynx w/o Erythema/Exudate Eyes: Conjunctiva clear, sclera non-icteric  Neck: trachea midline.  No JVD.  Pulmonary:  Good air movement, respirations not labored, no use of accessory muscles  Cardiac: RRR, no JVD Vascular:  Vessel Right Left  Radial Palpable Palpable                          DP 1+ 1+  PT 1+ 1+   Gastrointestinal:. No masses, surgical incisions, or scars. Musculoskeletal: M/S 5/5 throughout.  Extremities without ischemic changes.  No deformity or atrophy. No significant LE edema. Neurologic: Sensation grossly intact in extremities.  Symmetrical.  Speech is fluent. Motor exam as listed above. Psychiatric: Judgment intact, Mood & affect appropriate for pt's clinical situation. Dermatologic: No rashes or ulcers noted.  No cellulitis or open wounds.    Radiology DG Knee Complete 4 Views Right  Result Date: 03/18/2019 CLINICAL DATA:  Right knee Pruitt for 3 weeks EXAM: RIGHT KNEE - COMPLETE 4+ VIEW COMPARISON:  None. FINDINGS: No evidence of fracture, dislocation, or joint effusion. No evidence of arthropathy or other focal bone abnormality. Soft tissues are unremarkable. IMPRESSION: Negative. Electronically Signed   By: Ulyses Jarred M.D.   On: 03/18/2019 03:15   DG Hand Complete Right  Result Date: 03/26/2019 CLINICAL DATA:  Right hand injury, lacerations EXAM: RIGHT HAND - COMPLETE 3+ VIEW COMPARISON:  None. FINDINGS: Frontal, oblique, lateral views of the right hand are obtained. There is a comminuted open fracture of the distal tuft of the first distal phalanx. No other acute displaced fractures. No radiopaque foreign bodies. Alignment is anatomic. IMPRESSION: 1. Open comminuted fracture distal tuft first distal phalanx. Electronically Signed   By: Randa Ngo M.D.   On: 03/26/2019 00:24    Labs Recent Results (from the past 2160 hour(s))  TSH     Status: None   Collection Time: 04/01/19 10:00 AM  Result Value Ref Range   TSH 1.04 0.35 - 4.50 uIU/mL  CBC with Differential/Platelet     Status: Abnormal   Collection Time: 04/01/19 10:00 AM  Result Value Ref Range   WBC 13.0 (H) 4.0 - 10.5 K/uL   RBC 5.34 4.22 - 5.81 Mil/uL   Hemoglobin 15.8 13.0 - 17.0 g/dL   HCT 47.4 39.0 - 52.0 %   MCV 88.8 78.0 - 100.0 fl   MCHC 33.3 30.0 - 36.0 g/dL   RDW 14.5 11.5 - 15.5 %   Platelets 215.0 150.0 - 400.0 K/uL   Neutrophils Relative % 73.2 43.0 - 77.0 %   Lymphocytes Relative 18.3 12.0 - 46.0 %   Monocytes Relative 6.7 3.0 - 12.0 %   Eosinophils Relative 1.1 0.0 - 5.0 %   Basophils Relative 0.7 0.0 - 3.0 %   Neutro Abs 9.5 (H) 1.4 - 7.7 K/uL   Lymphs Abs 2.4 0.7 - 4.0 K/uL   Monocytes Absolute 0.9 0.1 - 1.0 K/uL   Eosinophils Absolute 0.1 0.0 -  0.7 K/uL   Basophils Absolute 0.1 0.0 - 0.1 K/uL  Comprehensive metabolic panel     Status: Abnormal   Collection Time: 04/01/19 10:00 AM  Result Value Ref Range   Sodium 135 135 - 145 mEq/L   Potassium 4.2 3.5 - 5.1 mEq/L   Chloride 102 96 - 112 mEq/L   CO2 24 19 - 32 mEq/L   Glucose, Bld 132 (H) 70 - 99 mg/dL   BUN 21 6 - 23 mg/dL   Creatinine, Ser 0.85 0.40 - 1.50 mg/dL   Total Bilirubin 0.4 0.2 - 1.2 mg/dL   Alkaline Phosphatase 81 39 - 117 U/L   AST 13 0 -  37 U/L   ALT 25 0 - 53 U/L   Total Protein 6.9 6.0 - 8.3 g/dL   Albumin 4.1 3.5 - 5.2 g/dL   GFR 95.10 >60.00 mL/min   Calcium 9.8 8.4 - 10.5 mg/dL  Hemoglobin A1c     Status: Abnormal   Collection Time: 04/01/19 10:00 AM  Result Value Ref Range   Hgb A1c MFr Bld 8.9 (H) 4.6 - 6.5 %    Comment: Glycemic Control Guidelines for People with Diabetes:Non Diabetic:  <6%Goal of Therapy: <7%Additional Action Suggested:  >8%   Lipid panel     Status: Abnormal   Collection Time: 04/01/19 10:00 AM  Result Value Ref Range   Cholesterol 189 0 - 200 mg/dL    Comment: ATP III Classification       Desirable:  < 200 mg/dL               Borderline High:  200 - 239 mg/dL          High:  > = 240 mg/dL   Triglycerides 264.0 (H) 0.0 - 149.0 mg/dL    Comment: Normal:  <150 mg/dLBorderline High:  150 - 199 mg/dL   HDL 33.70 (L) >39.00 mg/dL   VLDL 52.8 (H) 0.0 - 40.0 mg/dL   Total CHOL/HDL Ratio 6     Comment:                Men          Women1/2 Average Risk     3.4          3.3Average Risk          5.0          4.42X Average Risk          9.6          7.13X Average Risk          15.0          11.0                       NonHDL 155.76     Comment: NOTE:  Non-HDL goal should be 30 mg/dL higher than patient's LDL goal (i.e. LDL goal of < 70 mg/dL, would have non-HDL goal of < 100 mg/dL)  PSA     Status: None   Collection Time: 04/01/19 10:00 AM  Result Value Ref Range   PSA 0.26 0.10 - 4.00 ng/mL    Comment: Test performed using Access [employer]britech PSA Assay, a parmagnetic partical, chemiluminecent immunoassay.  B12 and Folate Panel     Status: Abnormal   Collection Time: 04/01/19 10:00 AM  Result Value Ref Range   Vitamin B-12 180 (L) 211 - 911 pg/mL   Folate 3.9 (L) >5.9 ng/mL  LDL cholesterol, direct  Status: None   Collection Time: 04/01/19 10:00 AM  Result Value Ref Range   Direct LDL 133.0 mg/dL    Comment: Optimal:  <100 mg/dLNear or Above Optimal:  100-129 mg/dLBorderline High:  130-159  mg/dLHigh:  160-189 mg/dLVery High:  >190 mg/dL    Assessment/Plan:  HTN (hypertension) blood pressure control important in reducing the progression of atherosclerotic disease. On appropriate oral medications.   DM (diabetes mellitus) ([place]) This is the most likely cause of his lower extremity numbness, but also be a risk factor for arterial disease. blood glucose control important in reducing the progression of atherosclerotic disease. Also, involved in wound healing. On appropriate medications.   HLD (hyperlipidemia) lipid control important in reducing the progression of atherosclerotic disease. Continue statin therapy   Numbness of right foot  Recommend:  The patient has atypical Pruitt symptoms for pure atherosclerotic disease. However, on physical exam there is evidence of mixed venous and arterial disease, given the diminished pulses and the edema associated with venous changes of the legs.  Noninvasive studies including ABI's and venous ultrasound of the legs will be obtained and the patient will follow up with me to review these studies.  I suspect the patient is c/o pseudoclaudication.  Patient should have an evaluation of his LS spine which I defer to the primary service.  The patient should continue walking and begin a more formal exercise program. The patient should continue his antiplatelet therapy and aggressive treatment of the lipid abnormalities.  The patient should begin wearing graduated compression socks 15-20 mmHg strength to control edema.       Ronald Pruitt 04/12/2019, 10:22 AM   This note was created with Dragon medical transcription system.  Any errors from dictation are unintentional.

## 2019-04-12 NOTE — Telephone Encounter (Signed)
I called patient & he is scheduled at 8am on 4/7 to come to have evaluation of LS as well as potential xrays.

## 2019-04-21 DIAGNOSIS — S61401A Unspecified open wound of right hand, initial encounter: Secondary | ICD-10-CM | POA: Diagnosis not present

## 2019-04-25 ENCOUNTER — Ambulatory Visit: Payer: Self-pay | Admitting: Podiatry

## 2019-04-28 ENCOUNTER — Other Ambulatory Visit: Payer: Self-pay | Admitting: Family

## 2019-04-28 DIAGNOSIS — N529 Male erectile dysfunction, unspecified: Secondary | ICD-10-CM

## 2019-04-28 MED ORDER — TADALAFIL 20 MG PO TABS: ORAL_TABLET | ORAL | 0 refills | Status: DC

## 2019-05-04 ENCOUNTER — Ambulatory Visit: Payer: Self-pay | Admitting: Podiatry

## 2019-05-04 ENCOUNTER — Ambulatory Visit: Payer: BC Managed Care – PPO | Admitting: Family

## 2019-05-13 ENCOUNTER — Encounter (INDEPENDENT_AMBULATORY_CARE_PROVIDER_SITE_OTHER): Payer: Self-pay

## 2019-05-13 ENCOUNTER — Ambulatory Visit (INDEPENDENT_AMBULATORY_CARE_PROVIDER_SITE_OTHER): Payer: BC Managed Care – PPO | Admitting: Nurse Practitioner

## 2019-05-17 ENCOUNTER — Other Ambulatory Visit: Payer: BC Managed Care – PPO

## 2019-05-23 ENCOUNTER — Other Ambulatory Visit: Payer: Self-pay | Admitting: Family

## 2019-05-23 ENCOUNTER — Encounter: Payer: Self-pay | Admitting: Family

## 2019-05-23 DIAGNOSIS — I10 Essential (primary) hypertension: Secondary | ICD-10-CM

## 2019-05-24 ENCOUNTER — Other Ambulatory Visit: Payer: Self-pay

## 2019-05-24 DIAGNOSIS — N529 Male erectile dysfunction, unspecified: Secondary | ICD-10-CM

## 2019-05-24 MED ORDER — TADALAFIL 20 MG PO TABS: ORAL_TABLET | ORAL | 0 refills | Status: DC

## 2019-05-27 ENCOUNTER — Encounter: Payer: Self-pay | Admitting: Family

## 2019-05-27 ENCOUNTER — Other Ambulatory Visit: Payer: Self-pay

## 2019-05-27 DIAGNOSIS — K219 Gastro-esophageal reflux disease without esophagitis: Secondary | ICD-10-CM

## 2019-05-27 MED ORDER — OMEPRAZOLE 20 MG PO CPDR: DELAYED_RELEASE_CAPSULE | ORAL | 1 refills | Status: DC

## 2019-06-03 ENCOUNTER — Encounter (INDEPENDENT_AMBULATORY_CARE_PROVIDER_SITE_OTHER): Payer: Self-pay | Admitting: Nurse Practitioner

## 2019-07-03 ENCOUNTER — Other Ambulatory Visit: Payer: Self-pay | Admitting: Family

## 2019-07-03 DIAGNOSIS — N529 Male erectile dysfunction, unspecified: Secondary | ICD-10-CM

## 2019-07-04 ENCOUNTER — Ambulatory Visit: Payer: BC Managed Care – PPO | Admitting: Family

## 2019-07-04 NOTE — Telephone Encounter (Signed)
Refill request for cialis, last seen 04-01-19, last filled 05-24-19.  Please advise.

## 2019-07-04 NOTE — Telephone Encounter (Signed)
Call pt  I have given  Him courtesy refills in the past, recently in april He is reallyl over due for Follow-up urology for future refills of Cialis.    He has seen [facility] in the past.  Please advise him to call their  office.

## 2019-07-05 ENCOUNTER — Encounter: Payer: Self-pay | Admitting: Family

## 2019-07-08 ENCOUNTER — Other Ambulatory Visit: Payer: Self-pay | Admitting: Family

## 2019-07-08 DIAGNOSIS — I10 Essential (primary) hypertension: Secondary | ICD-10-CM

## 2019-07-08 DIAGNOSIS — N529 Male erectile dysfunction, unspecified: Secondary | ICD-10-CM

## 2019-07-12 ENCOUNTER — Encounter: Payer: Self-pay | Admitting: Family

## 2019-07-13 ENCOUNTER — Other Ambulatory Visit: Payer: Self-pay | Admitting: Family

## 2019-07-13 DIAGNOSIS — N529 Male erectile dysfunction, unspecified: Secondary | ICD-10-CM

## 2019-07-13 MED ORDER — TADALAFIL 20 MG PO TABS: ORAL_TABLET | ORAL | 0 refills | Status: DC

## 2019-07-22 NOTE — Telephone Encounter (Signed)
Left a message to call back.

## 2019-08-06 ENCOUNTER — Other Ambulatory Visit: Payer: Self-pay | Admitting: Family

## 2019-08-06 DIAGNOSIS — I10 Essential (primary) hypertension: Secondary | ICD-10-CM

## 2019-08-12 ENCOUNTER — Telehealth: Payer: Self-pay

## 2019-08-12 ENCOUNTER — Ambulatory Visit: Payer: Commercial Managed Care - PPO | Admitting: Family

## 2019-08-12 ENCOUNTER — Encounter: Payer: Self-pay | Admitting: Family

## 2019-08-12 ENCOUNTER — Other Ambulatory Visit: Payer: Self-pay

## 2019-08-12 DIAGNOSIS — R200 Anesthesia of skin: Secondary | ICD-10-CM

## 2019-08-12 DIAGNOSIS — E119 Type 2 diabetes mellitus without complications: Secondary | ICD-10-CM | POA: Diagnosis not present

## 2019-08-12 DIAGNOSIS — E785 Hyperlipidemia, unspecified: Secondary | ICD-10-CM | POA: Diagnosis not present

## 2019-08-12 DIAGNOSIS — I10 Essential (primary) hypertension: Secondary | ICD-10-CM | POA: Diagnosis not present

## 2019-08-12 MED ORDER — OZEMPIC (0.25 OR 0.5 MG/DOSE) 2 MG/1.5ML ~~LOC~~ SOPN: 0.2500 mg | PEN_INJECTOR | SUBCUTANEOUS | 3 refills | Status: DC

## 2019-08-12 MED ORDER — LISINOPRIL 20 MG PO TABS: 20.0000 mg | ORAL_TABLET | Freq: Every day | ORAL | 1 refills | Status: DC

## 2019-08-12 NOTE — Assessment & Plan Note (Signed)
Anticipate uncontrolled due to lapse in medications. Pending a1c and resuming medications.

## 2019-08-12 NOTE — Assessment & Plan Note (Addendum)
Not at goal. Increase lisinopril. Repeat bmp one week ( with other pending labs)

## 2019-08-12 NOTE — Assessment & Plan Note (Addendum)
Worsening. Patient will call Dr Lucky Cowboy and have vascular studies completed. Advised DG lumbar today to evaluate if stenosis contributory, he declines. Will discuss after vascular work up complete. Referral to podiatry for suspect plantar fasciitis complicating clinical picture. Close follow up.

## 2019-08-12 NOTE — Telephone Encounter (Signed)
Prior authorization has been sent. Waiting for response

## 2019-08-12 NOTE — Assessment & Plan Note (Signed)
On increased dose lipitor, will repeat lipid panel at follow up.

## 2019-08-12 NOTE — Patient Instructions (Addendum)
Please call Dr Bunnie Domino office to reschedule tests as we discussed.   Start lisinopril 20mg    Labs in one week  It is imperative that you are seen AT least twice per year for labs and monitoring. Monitor blood pressure at home and me 5-6 reading on separate days. Goal is less than 120/80, based on newest guidelines, however we certainly want to be less than 130/80;  if persistently higher, please make sooner follow up appointment so we can recheck you blood pressure and manage/ adjust medications.  Referral to podiatry Let us know if you dont hear back within a week in regards to an appointment being scheduled.

## 2019-08-12 NOTE — Progress Notes (Signed)
Subjective:    Patient ID: Ronald Karvonen., male    DOB: 08-27-1970, 49 y.o.   MRN: [id number]  CC: Ronald Sturdevant. is a 49 y.o. male who presents today for follow up.   HPI:  Has new job, insurance now.  Feels well today No new concerns  Right great toe numbness x 6 months, worsening.  Complains of right lateral heel pain x 3-4 months, worsening.  Pain improves with supportive shoes. Pain when walking in barefoot and pain worse in the morning. NO low back. No numbness in buttucks or running down leg. No trouble urinating. No constipation.  Has pain in calves with walking.   H/o gout.  DM- ran out of lantus, now back on 48 units. No hypoglycemic episodes.  FBG 160-170.  NO blurry vision. NO polyphagia , polydipsia.   Ran out of ozempic   htn- compliant with medications.  No CP, sob.   Right knee pain- following with [facility] for this.  BPH- has follow up with [facility] next month.    HLD- has increased to lipitor 63m. Tolerating well    consult with vascular 04/12/19 with Dr DLucky Cowboyhowever never had   LDL 133   HISTORY:  Past Medical History:  Diagnosis Date  . Arthritis   . BPH (benign prostatic hyperplasia)   . [place]   . Dysuria   . GERD (gastroesophageal reflux disease)   . History of kidney stones   . HTN (hypertension)   . Hyperlipidemia   . Impotence   . Nocturia   . Obesity   . Pneumonia age 49's . Sleep apnea    CPAP - severe (sleep study 01/11/16)  . Testicular hypofunction   . Wears dentures    full upper and lower   Past Surgical History:  Procedure Laterality Date  . CHOLECYSTECTOMY    . COLONOSCOPY WITH PROPOFOL N/A 10/12/2018   Procedure: COLONOSCOPY WITH PROPOFOL;  Surgeon: WLucilla Lame MD;  [facility];  Service: Endoscopy;  Laterality: N/A;  . NASAL SEPTOPLASTY W/ TURBINOPLASTY N/A 05/20/2017   Procedure: NASAL SEPTOPLASTY WITH SUBMUCOUS RESECTION;  Surgeon: BClyde Canterbury MD;  Location: [facility];   Service: ENT;  Laterality: N/A;  . VASECTOMY     Family History  Problem Relation Age of Onset  . Hyperlipidemia Mother   . Diabetes Mellitus II Mother   . Seizures Mother   . Diabetes Mother   . Mental illness Mother   . Arthritis Mother   . Diabetes Father   . Cirrhosis Father        non alcoholic  . Hyperlipidemia Father   . Arthritis Father   . Liver cancer Father   . Diabetes Maternal Grandmother   . Hyperlipidemia Maternal Grandmother   . Arthritis Maternal Grandmother   . Diabetes Maternal Grandfather   . Hyperlipidemia Maternal Grandfather   . Arthritis Maternal Grandfather   . Diabetes Paternal Grandmother   . Hyperlipidemia Paternal Grandmother   . Arthritis Paternal Grandmother   . Diabetes Paternal Grandfather   . Hyperlipidemia Paternal Grandfather   . Arthritis Paternal Grandfather   . Kidney disease Neg Hx   . Prostate cancer Neg Hx   . Kidney cancer Neg Hx   . Bladder Cancer Neg Hx   . Colon cancer Neg Hx   . Thyroid cancer Neg Hx     Allergies: Patient has no known allergies. Current Outpatient Medications on File Prior to Visit  Medication Sig Dispense Refill  .  amLODipine (NORVASC) 10 MG tablet Take 1 tablet by mouth once daily 90 tablet 0  . atorvastatin (LIPITOR) 80 MG tablet Take 1 tablet (80 mg total) by mouth daily at 6 PM. 90 tablet 1  . Blood Glucose Monitoring Suppl (ONETOUCH VERIO) w/Device KIT 1 Device by Does not apply route daily. 1 kit 0  . cetirizine (EQ ALLERGY RELIEF, CETIRIZINE,) 10 MG tablet Take 1 tablet (10 mg total) by mouth daily. 90 tablet 1  . folic acid (FOLVITE) 1 MG tablet Take 1 tablet (1 mg total) by mouth daily. 90 tablet 3  . glucose blood test strip Use to check blood sugar up to 3 times daily 300 each 3  . Insulin Glargine (LANTUS) 100 UNIT/ML Solostar Pen Inject 48 units into the skin daily. 54 mL 11  . Insulin Pen Needle (NOVOFINE) 30G X 8 MM MISC Use to inject insulin and Victoza each 1x per day E11.9 180 each 3  .  metFORMIN (GLUCOPHAGE-XR) 500 MG 24 hr tablet Take 2 tablets (1,000 mg total) by mouth 2 (two) times daily. 360 tablet 1  . omeprazole (PRILOSEC) 20 MG capsule Take 1 capsule by mouth once daily 90 capsule 1  . oxyCODONE-acetaminophen (PERCOCET) 5-325 MG tablet Take 1 tablet by mouth every 4 (four) hours as needed for moderate pain. 15 tablet 0  . tadalafil (CIALIS) 20 MG tablet TAKE 1 TABLET BY MOUTH ONCE DAILY AS NEEDED FOR ERECTILE DYSFUNCTION. Effects may last up to 36h. 30 tablet 0   No current facility-administered medications on file prior to visit.    Social History   Tobacco Use  . Smoking status: Current Every Day Smoker    Packs/day: 0.50    Years: 20.00    Pack years: 10.00    Types: Cigarettes  . Smokeless tobacco: Never Used  Vaping Use  . Vaping Use: Former  Substance Use Topics  . Alcohol use: No    Alcohol/week: 0.0 standard drinks  . Drug use: Never    Review of Systems  Constitutional: Negative for chills and fever.  Respiratory: Negative for cough.   Cardiovascular: Negative for chest pain, palpitations and leg swelling.  Gastrointestinal: Negative for nausea and vomiting.  Genitourinary: Negative for decreased urine volume and difficulty urinating.  Musculoskeletal: Positive for arthralgias (right knee). Negative for back pain.  Neurological: Positive for numbness.      Objective:    BP (!) 132/92 (BP Location: Left Arm, Patient Position: Sitting)   Pulse 81   Temp 98.6 F (37 C)   Resp 12   Ht 6' 0.99" (1.854 m)   Wt 231 lb 12.8 oz (105.1 kg)   SpO2 97%   BMI 30.59 kg/m  BP Readings from Last 3 Encounters:  08/12/19 (!) 132/92  04/12/19 (!) 149/99  04/01/19 122/90   Wt Readings from Last 3 Encounters:  08/12/19 231 lb 12.8 oz (105.1 kg)  04/12/19 230 lb (104.3 kg)  04/01/19 229 lb 6.4 oz (104.1 kg)    Physical Exam Vitals reviewed.  Constitutional:      Appearance: He is well-developed.  Cardiovascular:     Rate and Rhythm: Regular  rhythm.     Heart sounds: Normal heart sounds.  Pulmonary:     Effort: Pulmonary effort is normal. No respiratory distress.     Breath sounds: Normal breath sounds. No wheezing, rhonchi or rales.  Musculoskeletal:     Right foot: Tenderness (pain of dorsal aspect of foot with dorsiflexion) present. No swelling.  Skin:  General: Skin is warm and dry.  Neurological:     Mental Status: He is alert.  Psychiatric:        Speech: Speech normal.        Behavior: Behavior normal.        Assessment & Plan:   Problem List Items Addressed This Visit      Cardiovascular and Mediastinum   HTN (hypertension)    Improved. Deferred making changes today as patient and I would like readings from home. He will send log       Relevant Medications   lisinopril (ZESTRIL) 20 MG tablet     Endocrine   DM (diabetes mellitus) ([place])    Anticipate uncontrolled due to lapse in medications. Pending a1c and resuming medications.       Relevant Medications   Semaglutide,0.25 or 0.5MG/DOS, (OZEMPIC, 0.25 OR 0.5 MG/DOSE,) 2 MG/1.5ML SOPN   lisinopril (ZESTRIL) 20 MG tablet   Other Relevant Orders   Hemoglobin A1c     Other   HLD (hyperlipidemia)    On increased dose lipitor, will repeat lipid panel at follow up.       Relevant Medications   lisinopril (ZESTRIL) 20 MG tablet   Numbness of right foot    Worsening. Patient will call Dr Lucky Cowboy and have vascular studies completed. Advised DG lumbar today to evaluate if stenosis contributory, he declines. Will discuss after vascular work up complete. Referral to podiatry for suspect plantar fasciitis complicating clinical picture. Close follow up.       Relevant Orders   Ambulatory referral to Podiatry       I have discontinued Ronald Dunagan. Burditt Jr.'s cephALEXin. I have also changed his Ozempic (0.25 or 0.5 MG/DOSE) and lisinopril. Additionally, I am having him maintain his Insulin Pen Needle, OneTouch Verio, cetirizine, glucose blood, metFORMIN,  insulin glargine, oxyCODONE-acetaminophen, folic acid, atorvastatin, amLODipine, omeprazole, and tadalafil.   Meds ordered this encounter  Medications  . Semaglutide,0.25 or 0.5MG/DOS, (OZEMPIC, 0.25 OR 0.5 MG/DOSE,) 2 MG/1.5ML SOPN    Sig: Inject 0.1875 mLs (0.25 mg total) into the skin once a week.    Dispense:  1 pen    Refill:  3    Order Specific Question:   Supervising Provider    Answer:   Derrel Nip, TERESA L [[id number]]  . lisinopril (ZESTRIL) 20 MG tablet    Sig: Take 1 tablet (20 mg total) by mouth daily.    Dispense:  90 tablet    Refill:  1    Order Specific Question:   Supervising Provider    Answer:   Crecencio Mc [[id number]]    Return precautions given.   Risks, benefits, and alternatives of the medications and treatment plan prescribed today were discussed, and patient expressed understanding.   Education regarding symptom management and diagnosis given to patient on AVS.  Continue to follow with Burnard Hawthorne, FNP for routine health maintenance.   Ronald Karvonen. and I agreed with plan.   Ronald Paris, FNP

## 2019-08-19 ENCOUNTER — Other Ambulatory Visit (INDEPENDENT_AMBULATORY_CARE_PROVIDER_SITE_OTHER): Payer: Commercial Managed Care - PPO

## 2019-08-19 ENCOUNTER — Other Ambulatory Visit: Payer: Self-pay

## 2019-08-19 DIAGNOSIS — R200 Anesthesia of skin: Secondary | ICD-10-CM

## 2019-08-19 DIAGNOSIS — E119 Type 2 diabetes mellitus without complications: Secondary | ICD-10-CM

## 2019-08-19 DIAGNOSIS — E785 Hyperlipidemia, unspecified: Secondary | ICD-10-CM | POA: Diagnosis not present

## 2019-08-19 DIAGNOSIS — R899 Unspecified abnormal finding in specimens from other organs, systems and tissues: Secondary | ICD-10-CM

## 2019-08-19 LAB — COMPREHENSIVE METABOLIC PANEL
ALT: 19 U/L (ref 0–53)
AST: 7 U/L (ref 0–37)
Albumin: 4.2 g/dL (ref 3.5–5.2)
Alkaline Phosphatase: 85 U/L (ref 39–117)
BUN: 14 mg/dL (ref 6–23)
CO2: 26 mEq/L (ref 19–32)
Calcium: 9.1 mg/dL (ref 8.4–10.5)
Chloride: 101 mEq/L (ref 96–112)
Creatinine, Ser: 0.82 mg/dL (ref 0.40–1.50)
GFR: 98.97 mL/min (ref >60.00)
Glucose, Bld: 264 mg/dL — ABNORMAL HIGH (ref 70–99)
Potassium: 4 mEq/L (ref 3.5–5.1)
Sodium: 135 mEq/L (ref 135–145)
Total Bilirubin: 0.4 mg/dL (ref 0.2–1.2)
Total Protein: 6.8 g/dL (ref 6.0–8.3)

## 2019-08-19 LAB — CBC WITH DIFFERENTIAL/PLATELET
Basophils Absolute: 0 10*3/uL (ref 0.0–0.1)
Basophils Relative: 0.5 % (ref 0.0–3.0)
Eosinophils Absolute: 0.2 10*3/uL (ref 0.0–0.7)
Eosinophils Relative: 2.9 % (ref 0.0–5.0)
HCT: 41.9 % (ref 39.0–52.0)
Hemoglobin: 14.5 g/dL (ref 13.0–17.0)
Lymphocytes Relative: 19.4 % (ref 12.0–46.0)
Lymphs Abs: 1.4 10*3/uL (ref 0.7–4.0)
MCHC: 34.6 g/dL (ref 30.0–36.0)
MCV: 89.1 fl (ref 78.0–100.0)
Monocytes Absolute: 0.4 10*3/uL (ref 0.1–1.0)
Monocytes Relative: 6 % (ref 3.0–12.0)
Neutro Abs: 4.9 10*3/uL (ref 1.4–7.7)
Neutrophils Relative %: 71.2 % (ref 43.0–77.0)
Platelets: 188 10*3/uL (ref 150.0–400.0)
RBC: 4.71 Mil/uL (ref 4.22–5.81)
RDW: 13.6 % (ref 11.5–15.5)
WBC: 7 10*3/uL (ref 4.0–10.5)

## 2019-08-19 LAB — HEMOGLOBIN A1C: Hgb A1c MFr Bld: 9.4 % — ABNORMAL HIGH (ref 4.6–6.5)

## 2019-08-23 LAB — INTRINSIC FACTOR ANTIBODIES: Intrinsic Factor: NEGATIVE

## 2019-08-24 NOTE — Telephone Encounter (Signed)
Prior Authorization for Ozempic has been approved. Patient has been notified.

## 2019-08-26 ENCOUNTER — Other Ambulatory Visit: Payer: Self-pay

## 2019-08-26 ENCOUNTER — Other Ambulatory Visit: Payer: Self-pay | Admitting: Family

## 2019-08-26 ENCOUNTER — Encounter: Payer: Self-pay | Admitting: Family

## 2019-08-26 DIAGNOSIS — N529 Male erectile dysfunction, unspecified: Secondary | ICD-10-CM

## 2019-08-26 DIAGNOSIS — I10 Essential (primary) hypertension: Secondary | ICD-10-CM

## 2019-08-26 DIAGNOSIS — K219 Gastro-esophageal reflux disease without esophagitis: Secondary | ICD-10-CM

## 2019-08-26 MED ORDER — TADALAFIL 20 MG PO TABS: ORAL_TABLET | ORAL | 0 refills | Status: DC

## 2019-08-26 NOTE — Telephone Encounter (Signed)
Courtesy refill He has upcoming urology appt

## 2019-08-26 NOTE — Telephone Encounter (Signed)
Pt requesting a refill of Cialis. Last refill on 07/13/19 and last visit on 08/12/19

## 2019-08-30 ENCOUNTER — Other Ambulatory Visit: Payer: Self-pay | Admitting: Podiatry

## 2019-08-30 ENCOUNTER — Other Ambulatory Visit: Payer: Self-pay

## 2019-08-30 ENCOUNTER — Encounter: Payer: Self-pay | Admitting: Family

## 2019-08-30 ENCOUNTER — Ambulatory Visit: Payer: Commercial Managed Care - PPO | Admitting: Podiatry

## 2019-08-30 ENCOUNTER — Ambulatory Visit (INDEPENDENT_AMBULATORY_CARE_PROVIDER_SITE_OTHER): Payer: Commercial Managed Care - PPO

## 2019-08-30 ENCOUNTER — Encounter: Payer: Self-pay | Admitting: Podiatry

## 2019-08-30 DIAGNOSIS — M722 Plantar fascial fibromatosis: Secondary | ICD-10-CM

## 2019-08-30 DIAGNOSIS — M79671 Pain in right foot: Secondary | ICD-10-CM

## 2019-08-30 NOTE — Progress Notes (Signed)
Subjective:  Patient ID: Ronald Pruitt., male    DOB: [birth year],  MRN: [id number]  Chief Complaint  Patient presents with  . Neuroma    pt is here for right foot pain, from the top of the right big toe to the bottom of the heel    49 y.o. male presentss with the above complaint.  Patient presents with right heel pain that has been going on for quite some time.  Patient states is progressing on worse.  Pain is worse when taking the first step when getting up from bed.  Pain gets a little bit better afterwards.  Patient states he has some numbness as well as shooting pain in the heel.  He denies any treatment options.  He has not seen anyone else prior to seeing me.  He did did not discuss any alleviating or aggravating factors.  Pain scale 7 out of 10 sharp shooting in nature   Review of Systems: Negative except as noted in the HPI. Denies N/V/F/Ch.  Past Medical History:  Diagnosis Date  . Arthritis   . BPH (benign prostatic hyperplasia)   . Diabetes ([place])   . Dysuria   . GERD (gastroesophageal reflux disease)   . History of kidney stones   . HTN (hypertension)   . Hyperlipidemia   . Impotence   . Nocturia   . Obesity   . Pneumonia age 49's  . Sleep apnea    CPAP - severe (sleep study 01/11/16)  . Testicular hypofunction   . Wears dentures    full upper and lower    Current Outpatient Medications:  .  amLODipine (NORVASC) 10 MG tablet, Take 1 tablet by mouth once daily, Disp: 90 tablet, Rfl: 0 .  atorvastatin (LIPITOR) 80 MG tablet, Take 1 tablet (80 mg total) by mouth daily at 6 PM., Disp: 90 tablet, Rfl: 1 .  Blood Glucose Monitoring Suppl (ONETOUCH VERIO) w/Device KIT, 1 Device by Does not apply route daily., Disp: 1 kit, Rfl: 0 .  cetirizine (EQ ALLERGY RELIEF, CETIRIZINE,) 10 MG tablet, Take 1 tablet (10 mg total) by mouth daily., Disp: 90 tablet, Rfl: 1 .  folic acid (FOLVITE) 1 MG tablet, Take 1 tablet (1 mg total) by mouth daily., Disp: 90 tablet, Rfl: 3 .   glucose blood test strip, Use to check blood sugar up to 3 times daily, Disp: 300 each, Rfl: 3 .  Insulin Glargine (LANTUS) 100 UNIT/ML Solostar Pen, Inject 48 units into the skin daily., Disp: 54 mL, Rfl: 11 .  Insulin Pen Needle (NOVOFINE) 30G X 8 MM MISC, Use to inject insulin and Victoza each 1x per day E11.9, Disp: 180 each, Rfl: 3 .  lisinopril (ZESTRIL) 20 MG tablet, Take 1 tablet (20 mg total) by mouth daily., Disp: 90 tablet, Rfl: 1 .  metFORMIN (GLUCOPHAGE-XR) 500 MG 24 hr tablet, Take 2 tablets (1,000 mg total) by mouth 2 (two) times daily., Disp: 360 tablet, Rfl: 1 .  omeprazole (PRILOSEC) 20 MG capsule, Take 1 capsule by mouth once daily, Disp: 90 capsule, Rfl: 0 .  oxyCODONE-acetaminophen (PERCOCET) 5-325 MG tablet, Take 1 tablet by mouth every 4 (four) hours as needed for moderate pain., Disp: 15 tablet, Rfl: 0 .  Semaglutide,0.25 or 0.5MG/DOS, (OZEMPIC, 0.25 OR 0.5 MG/DOSE,) 2 MG/1.5ML SOPN, Inject 0.1875 mLs (0.25 mg total) into the skin once a week., Disp: 1 pen, Rfl: 3 .  tadalafil (CIALIS) 20 MG tablet, TAKE 1 TABLET BY MOUTH ONCE DAILY AS NEEDED FOR  ERECTILE  DYSFUNCTION.  EFFECTS  MAY  LAST  UP  TO  36  HOURS, Disp: 30 tablet, Rfl: 0  Social History   Tobacco Use  Smoking Status Current Every Day Smoker  . Packs/day: 0.50  . Years: 20.00  . Pack years: 10.00  . Types: Cigarettes  Smokeless Tobacco Never Used    No Known Allergies Objective:  There were no vitals filed for this visit. There is no height or weight on file to calculate BMI. Constitutional Well developed. Well nourished.  Vascular Dorsalis pedis pulses palpable bilaterally. Posterior tibial pulses palpable bilaterally. Capillary refill normal to all digits.  No cyanosis or clubbing noted. Pedal hair growth normal.  Neurologic Normal speech. Oriented to person, place, and time. Epicritic sensation to light touch grossly present bilaterally.  Dermatologic Nails well groomed and normal in  appearance. No open wounds. No skin lesions.  Orthopedic: Normal joint ROM without pain or crepitus bilaterally. No visible deformities. Tender to palpation at the calcaneal tuber right. No pain with calcaneal squeeze right. Ankle ROM diminished range of motion right. Silfverskiold Test: positive right.   Radiographs: Taken and reviewed. No acute fractures or dislocations. No evidence of stress fracture.  Plantar heel spur present. Posterior heel spur present.   Assessment:   1. Foot pain, right    Plan:  Patient was evaluated and treated and all questions answered.  Plantar Fasciitis, right - XR reviewed as above.  - Educated on icing and stretching. Instructions given.  - Injection delivered to the plantar fascia as below. - DME: Plantar Fascial Brace - Pharmacologic management: None. Educated on risks/benefits and proper taking of medication.  Procedure: Injection Tendon/Ligament Location: Right plantar fascia at the glabrous junction; medial approach. Skin Prep: alcohol Injectate: 0.5 cc 0.5% marcaine plain, 0.5 cc of 1% Lidocaine, 0.5 cc kenalog 10. Disposition: Patient tolerated procedure well. Injection site dressed with a band-aid.  No follow-ups on file.

## 2019-08-30 NOTE — Progress Notes (Signed)
08/31/2019 10:47 PM   Ronald Pruitt. 12-27-1970 [id number]  Referring provider: Burnard Hawthorne, FNP [address] [place],  [place] [address]  Chief Complaint  Patient presents with  . Follow-up    HPI: Ronald Pruitt. is 49 y.o. male with testosterone deficiency, ED and BPH with LU TS who presents today to reestablish care.  Last seen in 2019.       Testosterone deficiency He is not having spontaneous erections at night.  He does have sleep apnea and is not sleeping with his CPAP machine.  He is reporting a loss of body hair, reduced beard growth, obesity and type 2 diabetes mellitus.  He has not been on therapy for a few years as his testosterone level had normalized.  He has started to feel fatigued and would like his levels checked again.   Erectile dysfunction His SHIM score is 10, which is moderate  ED.   His previous SHIM score was 11.  He has been having difficulty with erections for several years.   His major complaint is lack of firmness.  His libido is diminished   His risk factors for ED are age, BPH, testosterone deficiency, DM, HTN, HLD, sleep apnea, stress and night shift work.  He denies any painful erections or curvatures with his erections.   He is starting to have spontaneous erections.  He was taking tadalafil 20 mg daily - but it is not predictable    SHIM    Row Name 08/31/19 0843         SHIM: Over the last 6 months:   How do you rate your confidence that you could get and keep an erection? Very Low     When you had erections with sexual stimulation, how often were your erections hard enough for penetration (entering your partner)? A Few Times (much less than half the time)     During sexual intercourse, how often were you able to maintain your erection after you had penetrated (entered) your partner? A Few Times (much less than half the time)     During sexual intercourse, how difficult was it to maintain your erection to completion  of intercourse? Very Difficult     When you attempted sexual intercourse, how often was it satisfactory for you? Sometimes (about half the time)       SHIM Total Score   SHIM 10            Score: 1-7 Severe ED 8-11 Moderate ED 12-16 Mild-Moderate ED 17-21 Mild ED 22-25 No ED  BPH WITH LUTS  (prostate and/or bladder) IPSS score: 13/3   PVR: 0 mL   Previous score: 12/4   Previous PVR: 42 mL  Major complaint(s): Weak urinary stream and splitting of the urinary stream x few years. Denies any dysuria, hematuria or suprapubic pain.    Denies any recent fevers, chills, nausea or vomiting.  He does not have a family history of PCa.  UA is clear.     IPSS    Row Name 08/31/19 0800         International Prostate Symptom Score   How often have you had the sensation of not emptying your bladder? Less than half the time     How often have you had to urinate less than every two hours? Less than 1 in 5 times     How often have you found you stopped and started again several times when you  urinated? Not at All     How often have you found it difficult to postpone urination? Less than 1 in 5 times     How often have you had a weak urinary stream? More than half the time     How often have you had to strain to start urination? More than half the time     How many times did you typically get up at night to urinate? 1 Time     Total IPSS Score 13       Quality of Life due to urinary symptoms   If you were to spend the rest of your life with your urinary condition just the way it is now how would you feel about that? Mixed            Score:  1-7 Mild 8-19 Moderate 20-35 Severe  Nephrolithiasis Incidental finding of a 13 mm left renal stone on 2017 CT.  KUB today (04/22/2017) noted a coarse approximately 10 x 12 mm calcification projecting over the upper pole of the left kidney may reflect a parenchymal calcification or a stone.  He does not have any flank pain or gross hematuria at  this time, but he would like definitive treatment for his stones as he is concerned that the stones may migrate or cause issues in the future.   PMH: Past Medical History:  Diagnosis Date  . Arthritis   . BPH (benign prostatic hyperplasia)   . Diabetes ([place])   . Dysuria   . GERD (gastroesophageal reflux disease)   . History of kidney stones   . HTN (hypertension)   . Hyperlipidemia   . Impotence   . Nocturia   . Obesity   . Pneumonia age 14's  . Sleep apnea    CPAP - severe (sleep study 01/11/16)  . Testicular hypofunction   . Wears dentures    full upper and lower    Surgical History: Past Surgical History:  Procedure Laterality Date  . CHOLECYSTECTOMY    . COLONOSCOPY WITH PROPOFOL N/A 10/12/2018   Procedure: COLONOSCOPY WITH PROPOFOL;  Surgeon: Lucilla Lame, MD;  Location: [facility] END[facility]COPY;  Service: Endoscopy;  Laterality: N/A;  . NASAL SEPTOPLASTY W/ TURBINOPLASTY N/A 05/20/2017   Procedure: NASAL SEPTOPLASTY WITH SUBMUCOUS RESECTION;  Surgeon: Clyde Canterbury, MD;  Location: [facility] ORS;  Service: ENT;  Laterality: N/A;  . VASECTOMY      Home Medications:  Allergies as of 08/31/2019   No Known Allergies     Medication List       Accurate as of August 31, 2019 11:59 PM. If you have any questions, ask your nurse or doctor.        STOP taking these medications   cetirizine 10 MG tablet Commonly known as: EQ Allergy Relief (Cetirizine) Stopped by: Keigo Whalley, PA-C   Insulin Pen Needle 30G X 8 MM Misc Commonly known as: NOVOFINE Stopped by: Park Beck, PA-C   lisinopril 20 MG tablet Commonly known as: ZESTRIL Stopped by: Lexandra Rettke, PA-C   oxyCODONE-acetaminophen 5-325 MG tablet Commonly known as: Percocet Stopped by: Zara Council, PA-C     TAKE these medications   amLODipine 10 MG tablet Commonly known as: NORVASC Take 1 tablet by mouth once daily   atorvastatin 80 MG tablet Commonly known as: LIPITOR Take 1 tablet (80 mg total) by  mouth daily at 6 PM.   folic acid 1 MG tablet Commonly known as: FOLVITE Take 1 tablet (1 mg total) by mouth daily.  glucose blood test strip Use to check blood sugar up to 3 times daily   insulin glargine 100 UNIT/ML Solostar Pen Commonly known as: LANTUS Inject 48 units into the skin daily.   metFORMIN 500 MG 24 hr tablet Commonly known as: GLUCOPHAGE-XR Take 2 tablets (1,000 mg total) by mouth 2 (two) times daily.   omeprazole 20 MG capsule Commonly known as: PRILOSEC Take 1 capsule by mouth once daily   OneTouch Verio w/Device Kit 1 Device by Does not apply route daily.   Ozempic (0.25 or 0.5 MG/DOSE) 2 MG/1.5ML Sopn Generic drug: Semaglutide(0.25 or 0.5MG/DOS) Inject 0.1875 mLs (0.25 mg total) into the skin once a week.   tadalafil 20 MG tablet Commonly known as: CIALIS TAKE 1 TABLET BY MOUTH ONCE DAILY AS NEEDED FOR  ERECTILE  DYSFUNCTION.  EFFECTS  MAY  LAST  UP  TO  36  HOURS What changed: Another medication with the same name was added. Make sure you understand how and when to take each. Changed by: Zara Council, PA-C   tadalafil 5 MG tablet Commonly known as: CIALIS Take 1 tablet (5 mg total) by mouth daily as needed for erectile dysfunction. What changed: You were already taking a medication with the same name, and this prescription was added. Make sure you understand how and when to take each. Changed by: Zara Council, PA-C       Allergies: No Known Allergies  Family History: Family History  Problem Relation Age of Onset  . Hyperlipidemia Mother   . Diabetes Mellitus II Mother   . Seizures Mother   . Diabetes Mother   . Mental illness Mother   . Arthritis Mother   . Diabetes Father   . Cirrhosis Father        non alcoholic  . Hyperlipidemia Father   . Arthritis Father   . Liver cancer Father   . Diabetes Maternal Grandmother   . Hyperlipidemia Maternal Grandmother   . Arthritis Maternal Grandmother   . Diabetes Maternal Grandfather     . Hyperlipidemia Maternal Grandfather   . Arthritis Maternal Grandfather   . Diabetes Paternal Grandmother   . Hyperlipidemia Paternal Grandmother   . Arthritis Paternal Grandmother   . Diabetes Paternal Grandfather   . Hyperlipidemia Paternal Grandfather   . Arthritis Paternal Grandfather   . Kidney disease Neg Hx   . Prostate cancer Neg Hx   . Kidney cancer Neg Hx   . Bladder Cancer Neg Hx   . Colon cancer Neg Hx   . Thyroid cancer Neg Hx     Social History:  reports that he has been smoking cigarettes. He has a 10.00 pack-year smoking history. He has never used smokeless tobacco. He reports that he does not drink alcohol and does not use drugs.  ROS: For pertinent review of systems please refer to history of present illness  Physical Exam: BP (!) 142/83   Pulse 92   Ht 6' (1.829 m)   Wt 235 lb (106.6 kg)   BMI 31.87 kg/m   Constitutional:  Well nourished. Alert and oriented, No acute distress. HEENT: [place] AT, mask in place.  Trachea midline Cardiovascular: No clubbing, cyanosis, or edema. Respiratory: Normal respiratory effort, no increased work of breathing. GI: Abdomen is soft, non tender, non distended, no abdominal masses. Liver and spleen not palpable.  No hernias appreciated.  Stool sample for occult testing is not indicated.   GU: No CVA tenderness.  No bladder fullness or masses.  Patient with uncircumcised  phallus.   Foreskin easily retracted  Urethral meatus is patent.  No penile discharge. No penile lesions or rashes. Scrotum without lesions, cysts, rashes and/or edema.  Testicles are located scrotally bilaterally. No masses are appreciated in the testicles. Left and right epididymis are normal. Rectal: Patient with  normal sphincter tone. Anus and perineum without scarring or rashes. No rectal masses are appreciated. Prostate is approximately 50 grams, no nodules are appreciated. Seminal vesicles could not be palpated.   Skin: No rashes, bruises or suspicious  lesions. Lymph: No inguinal adenopathy. Neurologic: Grossly intact, no focal deficits, moving all 4 extremities. Psychiatric: Normal mood and affect.   Laboratory Data: PSA History  0.3 ng/mL on 04/00/2017  0.3 ng/mL on 04/21/2016  0.3 ng/mL on 10/15/2016  0.2 ng/mL on 04/15/2017  0.2 ng/mL on 12/01/2017 Component     Latest Ref Rng & Units 04/01/2019  PSA     0.10 - 4.00 ng/mL 0.26   Lab Results  Component Value Date   WBC 7.0 08/19/2019   HGB 14.5 08/19/2019   HCT 41.9 08/19/2019   MCV 89.1 08/19/2019   PLT 188.0 08/19/2019    Lab Results  Component Value Date   CREATININE 0.82 08/19/2019    Lab Results  Component Value Date   AST 7 08/19/2019   Lab Results  Component Value Date   ALT 19 08/19/2019   Urinalysis Component     Latest Ref Rng & Units 08/31/2019  Specific Gravity, UA     1.005 - 1.030 1.015  pH, UA     5.0 - 7.5 5.5  Color, UA     Yellow Yellow  Appearance Ur     Clear Clear  Leukocytes,UA     Negative Negative  Protein,UA     Negative/Trace Negative  Glucose, UA     Negative 3+ (A)  Ketones, UA     Negative Negative  RBC, UA     Negative Negative  Bilirubin, UA     Negative Negative  Urobilinogen, Ur     0.2 - 1.0 mg/dL 0.2  Nitrite, UA     Negative Negative  Microscopic Examination      See below:   Component     Latest Ref Rng & Units 08/31/2019  WBC, UA     0 - 5 /hpf 0-5  RBC     0 - 2 /hpf 0-2  Epithelial Cells (non renal)     0 - 10 /hpf 0-10  Renal Epithel, UA     None seen /hpf 0-10 (A)  Bacteria, UA     None seen/Few None seen    I have reviewed the labs  Pertinent Imaging Results for CARI, VANDEBERG (MRN [id number]) as of 08/31/2019 09:09  Ref. Range 08/31/2019 08:43  Scan Result Unknown 74m    Assessment & Plan:    1. Testosterone deficiency  Testosterone level pending  2. BPH with LUTS IPSS score is 13/3, it is stable Continue conservative management, avoiding bladder irritants and timed  voiding's Most bothersome symptom is a split urinary stream We will start Cialis 5 mg daily as he has been on this in the past with good results He will be returning in 2 weeks to review CT scan report, so we will recheck IPSS at that time  3. Erectile dysfunction:    SHIM score is 10, it is slightly worse Continue Cialis 20 mg  4. Left renal stone We will obtain a CT renal stone study for  definitive stone treatment planning He will return for CT scan report  Return in about 2 weeks (around 09/14/2019) for CT Renal stone study report and I PSS .  These notes generated with voice recognition software. I apologize for typographical errors.  Zara Council, PA-C  [facility] [address] [place], [place] [address] [phone]

## 2019-08-31 ENCOUNTER — Encounter: Payer: Self-pay | Admitting: Urology

## 2019-08-31 ENCOUNTER — Ambulatory Visit (INDEPENDENT_AMBULATORY_CARE_PROVIDER_SITE_OTHER): Payer: Commercial Managed Care - PPO | Admitting: Urology

## 2019-08-31 VITALS — BP 142/83 | HR 92 | Ht 72.0 in | Wt 235.0 lb

## 2019-08-31 DIAGNOSIS — N529 Male erectile dysfunction, unspecified: Secondary | ICD-10-CM | POA: Diagnosis not present

## 2019-08-31 DIAGNOSIS — E349 Endocrine disorder, unspecified: Secondary | ICD-10-CM

## 2019-08-31 DIAGNOSIS — R351 Nocturia: Secondary | ICD-10-CM

## 2019-08-31 DIAGNOSIS — N401 Enlarged prostate with lower urinary tract symptoms: Secondary | ICD-10-CM

## 2019-08-31 DIAGNOSIS — N138 Other obstructive and reflux uropathy: Secondary | ICD-10-CM

## 2019-08-31 DIAGNOSIS — N200 Calculus of kidney: Secondary | ICD-10-CM

## 2019-08-31 LAB — BLADDER SCAN AMB NON-IMAGING

## 2019-08-31 MED ORDER — TADALAFIL 5 MG PO TABS: 5.0000 mg | ORAL_TABLET | Freq: Every day | ORAL | 11 refills | Status: DC | PRN

## 2019-09-01 ENCOUNTER — Telehealth: Payer: Self-pay | Admitting: Family Medicine

## 2019-09-01 LAB — TESTOSTERONE: Testosterone: 419 ng/dL (ref 264–916)

## 2019-09-01 NOTE — Telephone Encounter (Signed)
LMOM for patient to return call.

## 2019-09-01 NOTE — Telephone Encounter (Signed)
-----   Message from Nori Riis, PA-C sent at 09/01/2019  8:21 AM EDT ----- Please let Mr. Birr know that his testosterone level remains in the normal range, so there is no need for testosterone therapy at this time.

## 2019-09-02 LAB — MICROSCOPIC EXAMINATION: Bacteria, UA: NONE SEEN

## 2019-09-02 LAB — URINALYSIS, COMPLETE
Bilirubin, UA: NEGATIVE
Ketones, UA: NEGATIVE
Leukocytes,UA: NEGATIVE
Nitrite, UA: NEGATIVE
Protein,UA: NEGATIVE
RBC, UA: NEGATIVE
Specific Gravity, UA: 1.015 (ref 1.005–1.030)
Urobilinogen, Ur: 0.2 mg/dL (ref 0.2–1.0)
pH, UA: 5.5 (ref 5.0–7.5)

## 2019-09-02 NOTE — Telephone Encounter (Signed)
Left detailed message that Testosterone is normal and there is no need for therapy at this time.

## 2019-09-09 ENCOUNTER — Other Ambulatory Visit: Payer: Self-pay | Admitting: Family

## 2019-09-09 DIAGNOSIS — K219 Gastro-esophageal reflux disease without esophagitis: Secondary | ICD-10-CM

## 2019-09-12 ENCOUNTER — Ambulatory Visit
Admission: RE | Admit: 2019-09-12 | Discharge: 2019-09-12 | Disposition: A | Discharge status: Home / Self Care | Payer: Commercial Managed Care - PPO | Source: Ambulatory Visit | Attending: Urology | Admitting: Urology

## 2019-09-12 ENCOUNTER — Other Ambulatory Visit: Payer: Self-pay

## 2019-09-12 DIAGNOSIS — N200 Calculus of kidney: Secondary | ICD-10-CM | POA: Diagnosis not present

## 2019-09-13 NOTE — Progress Notes (Signed)
09/14/2019 9:40 AM   Ronald Pruitt. 08-23-1970 [id number]  Referring provider: Burnard Hawthorne, FNP [address] [place],  [place] [address]  Chief Complaint  Patient presents with  . Results    CTScan    HPI: Ronald Pruitt. is 49 y.o. male with testosterone deficiency, ED, BPH with LU TS and nephrolithiasis who presents today to discuss his CT renal stone study results.       Nephrolithiasis Incidental finding of a 13 mm left renal stone on 2017 CT.  KUB today (04/22/2017) noted a coarse approximately 10 x 12 mm calcification projecting over the upper pole of the left kidney may reflect a parenchymal calcification or a stone.  He does not have any flank pain or gross hematuria at this time, but he would like definitive treatment for his stones as he is concerned that the stones may migrate or cause issues in the future.  CT Renal stone study 09/12/2019 11 mm nonobstructive calculus in the upper pole collecting system of left kidney. No ureteral stones or findings of urinary tract obstruction are noted at this time.  Aortic atherosclerosis.     He is wanting definitive stone treatment at this time.    Patient denies any modifying or aggravating factors.  Patient denies any gross hematuria, dysuria or suprapubic/flank pain.  Patient denies any fevers, chills, nausea or vomiting.    PMH: Past Medical History:  Diagnosis Date  . Arthritis   . BPH (benign prostatic hyperplasia)   . Diabetes ([place])   . Dysuria   . GERD (gastroesophageal reflux disease)   . History of kidney stones   . HTN (hypertension)   . Hyperlipidemia   . Impotence   . Nocturia   . Obesity   . Pneumonia age 73's  . Sleep apnea    CPAP - severe (sleep study 01/11/16)  . Testicular hypofunction   . Wears dentures    full upper and lower    Surgical History: Past Surgical History:  Procedure Laterality Date  . CHOLECYSTECTOMY    . COLONOSCOPY WITH PROPOFOL N/A 10/12/2018    Procedure: COLONOSCOPY WITH PROPOFOL;  Surgeon: Lucilla Lame, MD;  Location: [facility] ENDOSCOPY;  Service: Endoscopy;  Laterality: N/A;  . NASAL SEPTOPLASTY W/ TURBINOPLASTY N/A 05/20/2017   Procedure: NASAL SEPTOPLASTY WITH SUBMUCOUS RESECTION;  Surgeon: Clyde Canterbury, MD;  Location: [facility] ORS;  Service: ENT;  Laterality: N/A;  . VASECTOMY      Home Medications:  Allergies as of 09/14/2019   No Known Allergies     Medication List       Accurate as of September 14, 2019  9:40 AM. If you have any questions, ask your nurse or doctor.        amLODipine 10 MG tablet Commonly known as: NORVASC Take 1 tablet by mouth once daily   atorvastatin 80 MG tablet Commonly known as: LIPITOR Take 1 tablet (80 mg total) by mouth daily at 6 PM.   folic acid 1 MG tablet Commonly known as: FOLVITE Take 1 tablet (1 mg total) by mouth daily.   glucose blood test strip Use to check blood sugar up to 3 times daily   insulin glargine 100 UNIT/ML Solostar Pen Commonly known as: LANTUS Inject 48 units into the skin daily.   metFORMIN 500 MG 24 hr tablet Commonly known as: GLUCOPHAGE-XR Take 2 tablets (1,000 mg total) by mouth 2 (two) times daily.   omeprazole 20 MG capsule Commonly known as:  PRILOSEC Take 1 capsule by mouth once daily   OneTouch Verio w/Device Kit 1 Device by Does not apply route daily.   Ozempic (0.25 or 0.5 MG/DOSE) 2 MG/1.5ML Sopn Generic drug: Semaglutide(0.25 or 0.5MG/DOS) Inject 0.1875 mLs (0.25 mg total) into the skin once a week.   tadalafil 20 MG tablet Commonly known as: CIALIS TAKE 1 TABLET BY MOUTH ONCE DAILY AS NEEDED FOR  ERECTILE  DYSFUNCTION.  EFFECTS  MAY  LAST  UP  TO  36  HOURS   tadalafil 5 MG tablet Commonly known as: CIALIS Take 1 tablet (5 mg total) by mouth daily as needed for erectile dysfunction.       Allergies: No Known Allergies  Family History: Family History  Problem Relation Age of Onset  . Hyperlipidemia Mother   . Diabetes Mellitus II  Mother   . Seizures Mother   . Diabetes Mother   . Mental illness Mother   . Arthritis Mother   . Diabetes Father   . Cirrhosis Father        non alcoholic  . Hyperlipidemia Father   . Arthritis Father   . Liver cancer Father   . Diabetes Maternal Grandmother   . Hyperlipidemia Maternal Grandmother   . Arthritis Maternal Grandmother   . Diabetes Maternal Grandfather   . Hyperlipidemia Maternal Grandfather   . Arthritis Maternal Grandfather   . Diabetes Paternal Grandmother   . Hyperlipidemia Paternal Grandmother   . Arthritis Paternal Grandmother   . Diabetes Paternal Grandfather   . Hyperlipidemia Paternal Grandfather   . Arthritis Paternal Grandfather   . Kidney disease Neg Hx   . Prostate cancer Neg Hx   . Kidney cancer Neg Hx   . Bladder Cancer Neg Hx   . Colon cancer Neg Hx   . Thyroid cancer Neg Hx     Social History:  reports that he has been smoking cigarettes. He has a 10.00 pack-year smoking history. He has never used smokeless tobacco. He reports that he does not drink alcohol and does not use drugs.  ROS: For pertinent review of systems please refer to history of present illness  Physical Exam: BP 120/82   Pulse 97   Ht 6' (1.829 m)   Wt 232 lb (105.2 kg)   BMI 31.46 kg/m   Constitutional:  Well nourished. Alert and oriented, No acute distress. HEENT: [place] AT, mask in place.  Trachea midline Cardiovascular: No clubbing, cyanosis, or edema. Respiratory: Normal respiratory effort, no increased work of breathing. Neurologic: Grossly intact, no focal deficits, moving all 4 extremities. Psychiatric: Normal mood and affect.   Laboratory Data: PSA History  0.3 ng/mL on 04/00/2017  0.3 ng/mL on 04/21/2016  0.3 ng/mL on 10/15/2016  0.2 ng/mL on 04/15/2017  0.2 ng/mL on 12/01/2017 Component     Latest Ref Rng & Units 04/01/2019  PSA     0.10 - 4.00 ng/mL 0.26   Lab Results  Component Value Date   WBC 7.0 08/19/2019   HGB 14.5 08/19/2019   HCT 41.9  08/19/2019   MCV 89.1 08/19/2019   PLT 188.0 08/19/2019    Lab Results  Component Value Date   CREATININE 0.82 08/19/2019    Lab Results  Component Value Date   AST 7 08/19/2019   Lab Results  Component Value Date   ALT 19 08/19/2019   Urinalysis Component     Latest Ref Rng & Units 08/31/2019  Specific Gravity, UA     1.005 - 1.030 1.015  pH, UA     5.0 - 7.5 5.5  Color, UA     Yellow Yellow  Appearance Ur     Clear Clear  Leukocytes,UA     Negative Negative  Protein,UA     Negative/Trace Negative  Glucose, UA     Negative 3+ (A)  Ketones, UA     Negative Negative  RBC, UA     Negative Negative  Bilirubin, UA     Negative Negative  Urobilinogen, Ur     0.2 - 1.0 mg/dL 0.2  Nitrite, UA     Negative Negative  Microscopic Examination      See below:   Component     Latest Ref Rng & Units 08/31/2019  WBC, UA     0 - 5 /hpf 0-5  RBC     0 - 2 /hpf 0-2  Epithelial Cells (non renal)     0 - 10 /hpf 0-10  Renal Epithel, UA     None seen /hpf 0-10 (A)  Bacteria, UA     None seen/Few None seen    I have reviewed the labs  Pertinent Imaging CLINICAL DATA:  49 year old male with history of flank pain.  EXAM: CT ABDOMEN AND PELVIS WITHOUT CONTRAST  TECHNIQUE: Multidetector CT imaging of the abdomen and pelvis was performed following the standard protocol without IV contrast.  COMPARISON:  CT the chest, abdomen and pelvis 11/02/2015.  FINDINGS: Lower chest: Unremarkable.  Hepatobiliary: No suspicious cystic or solid hepatic lesions are confidently identified on today's noncontrast CT examination. Status post cholecystectomy.  Pancreas: No definite pancreatic mass or peripancreatic fluid collections or inflammatory changes are noted on today's noncontrast CT examination.  Spleen: Unremarkable.  Adrenals/Urinary Tract: 11 mm nonobstructive calculus in the upper pole collecting system of the left kidney. No additional calculi are noted  within the collecting system of the right kidney, along the course of either ureter or within the lumen of the urinary bladder. No hydroureteronephrosis. Unenhanced appearance of the kidneys and bilateral adrenal glands is otherwise normal. Unenhanced appearance of the urinary bladder is normal.  Stomach/Bowel: Unenhanced appearance of the stomach is normal. No pathologic dilatation of small bowel or colon. Normal appendix.  Vascular/Lymphatic: Aortic atherosclerosis. No lymphadenopathy noted in the abdomen or pelvis.  Reproductive: Prostate gland and seminal vesicles are unremarkable in appearance.  Other: No significant volume of ascites.  No pneumoperitoneum.  Musculoskeletal: There are no aggressive appearing lytic or blastic lesions noted in the visualized portions of the skeleton.  IMPRESSION: 1. 11 mm nonobstructive calculus in the upper pole collecting system of left kidney. No ureteral stones or findings of urinary tract obstruction are noted at this time. 2. Aortic atherosclerosis.   Electronically Signed   By: Vinnie Langton M.D.   On: 09/12/2019 09:56 I have independently reviewed the films.  See HPI.   Assessment & Plan:    1. Left renal stone We discussed various treatment options for his left renal stone including shockwave lithotripsy (SWL), ureteroscopy and laser lithotripsy with stent placement and PCNL.   We discussed that management is based on stone size, location, density, patient co-morbidities, and patient preference.   SWL has a lower stone free rate in a single procedure, but also a lower complication rate compared to ureteroscopy and PCNL and avoids a stent and associated stent related symptoms. Possible complications include renal hematoma, steinstrasse, and need for additional treatment.  Ureteroscopy with laser lithotripsy and stent placement has a higher stone free rate than  SWL in a single procedure, however increased complication  rate including possible infection, ureteral injury, bleeding, and stent related morbidity. Common stent related symptoms include dysuria, urgency/frequency, and flank pain.  Patient is insistent on undergoing PCNL against my advice and I stated I would speak with Dr. Erlene Quan to obtain her thoughts on his definitive treatment for his stone.  These notes generated with voice recognition software. I apologize for typographical errors.  Zara Council, PA-C  [facility] [address] [place], [place] [address] [phone]  I spent 30 minutes on the day of the encounter to include pre-visit record review, face-to-face time with the patient, and post-visit ordering of tests.

## 2019-09-13 NOTE — H&P (View-Only) (Signed)
09/14/2019 9:40 AM   [name]. 08-23-1970 [id number]  Referring provider: Burnard Hawthorne, FNP [address] [place],  [place] [address]  Chief Complaint  Patient presents with  . Results    CTScan    HPI: Cole Eastridge. is 49 y.o. male with testosterone deficiency, ED, BPH with LU TS and nephrolithiasis who presents today to discuss his CT renal stone study results.       Nephrolithiasis Incidental finding of a 13 mm left renal stone on 2017 CT.  KUB today (04/22/2017) noted a coarse approximately 10 x 12 mm calcification projecting over the upper pole of the left kidney may reflect a parenchymal calcification or a stone.  He does not have any flank pain or gross hematuria at this time, but he would like definitive treatment for his stones as he is concerned that the stones may migrate or cause issues in the future.  CT Renal stone study 09/12/2019 11 mm nonobstructive calculus in the upper pole collecting system of left kidney. No ureteral stones or findings of urinary tract obstruction are noted at this time.  Aortic atherosclerosis.     He is wanting definitive stone treatment at this time.    Patient denies any modifying or aggravating factors.  Patient denies any gross hematuria, dysuria or suprapubic/flank pain.  Patient denies any fevers, chills, nausea or vomiting.    PMH: Past Medical History:  Diagnosis Date  . Arthritis   . BPH (benign prostatic hyperplasia)   . Diabetes ([place])   . Dysuria   . GERD (gastroesophageal reflux disease)   . History of kidney stones   . HTN (hypertension)   . Hyperlipidemia   . Impotence   . Nocturia   . Obesity   . Pneumonia age 73's  . Sleep apnea    CPAP - severe (sleep study 01/11/16)  . Testicular hypofunction   . Wears dentures    full upper and lower    Surgical History: Past Surgical History:  Procedure Laterality Date  . CHOLECYSTECTOMY    . COLONOSCOPY WITH PROPOFOL N/A 10/12/2018    Procedure: COLONOSCOPY WITH PROPOFOL;  Surgeon: Lucilla Lame, MD;  Location: [facility] ENDOSCOPY;  Service: Endoscopy;  Laterality: N/A;  . NASAL SEPTOPLASTY W/ TURBINOPLASTY N/A 05/20/2017   Procedure: NASAL SEPTOPLASTY WITH SUBMUCOUS RESECTION;  Surgeon: Clyde Canterbury, MD;  Location: [facility] ORS;  Service: ENT;  Laterality: N/A;  . VASECTOMY      Home Medications:  Allergies as of 09/14/2019   No Known Allergies     Medication List       Accurate as of September 14, 2019  9:40 AM. If you have any questions, ask your nurse or doctor.        amLODipine 10 MG tablet Commonly known as: NORVASC Take 1 tablet by mouth once daily   atorvastatin 80 MG tablet Commonly known as: LIPITOR Take 1 tablet (80 mg total) by mouth daily at 6 PM.   folic acid 1 MG tablet Commonly known as: FOLVITE Take 1 tablet (1 mg total) by mouth daily.   glucose blood test strip Use to check blood sugar up to 3 times daily   insulin glargine 100 UNIT/ML Solostar Pen Commonly known as: LANTUS Inject 48 units into the skin daily.   metFORMIN 500 MG 24 hr tablet Commonly known as: GLUCOPHAGE-XR Take 2 tablets (1,000 mg total) by mouth 2 (two) times daily.   omeprazole 20 MG capsule Commonly known as:  PRILOSEC Take 1 capsule by mouth once daily   OneTouch Verio w/Device Kit 1 Device by Does not apply route daily.   Ozempic (0.25 or 0.5 MG/DOSE) 2 MG/1.5ML Sopn Generic drug: Semaglutide(0.25 or 0.5MG/DOS) Inject 0.1875 mLs (0.25 mg total) into the skin once a week.   tadalafil 20 MG tablet Commonly known as: CIALIS TAKE 1 TABLET BY MOUTH ONCE DAILY AS NEEDED FOR  ERECTILE  DYSFUNCTION.  EFFECTS  MAY  LAST  UP  TO  36  HOURS   tadalafil 5 MG tablet Commonly known as: CIALIS Take 1 tablet (5 mg total) by mouth daily as needed for erectile dysfunction.       Allergies: No Known Allergies  Family History: Family History  Problem Relation Age of Onset  . Hyperlipidemia Mother   . Diabetes Mellitus II  Mother   . Seizures Mother   . Diabetes Mother   . Mental illness Mother   . Arthritis Mother   . Diabetes Father   . Cirrhosis Father        non alcoholic  . Hyperlipidemia Father   . Arthritis Father   . Liver cancer Father   . Diabetes Maternal Grandmother   . Hyperlipidemia Maternal Grandmother   . Arthritis Maternal Grandmother   . Diabetes Maternal Grandfather   . Hyperlipidemia Maternal Grandfather   . Arthritis Maternal Grandfather   . Diabetes Paternal Grandmother   . Hyperlipidemia Paternal Grandmother   . Arthritis Paternal Grandmother   . Diabetes Paternal Grandfather   . Hyperlipidemia Paternal Grandfather   . Arthritis Paternal Grandfather   . Kidney disease Neg Hx   . Prostate cancer Neg Hx   . Kidney cancer Neg Hx   . Bladder Cancer Neg Hx   . Colon cancer Neg Hx   . Thyroid cancer Neg Hx     Social History:  reports that he has been smoking cigarettes. He has a 10.00 pack-year smoking history. He has never used smokeless tobacco. He reports that he does not drink alcohol and does not use drugs.  ROS: For pertinent review of systems please refer to history of present illness  Physical Exam: BP 120/82   Pulse 97   Ht 6' (1.829 m)   Wt 232 lb (105.2 kg)   BMI 31.46 kg/m   Constitutional:  Well nourished. Alert and oriented, No acute distress. HEENT: [place] AT, mask in place.  Trachea midline Cardiovascular: No clubbing, cyanosis, or edema. Respiratory: Normal respiratory effort, no increased work of breathing. Neurologic: Grossly intact, no focal deficits, moving all 4 extremities. Psychiatric: Normal mood and affect.   Laboratory Data: PSA History  0.3 ng/mL on 04/00/2017  0.3 ng/mL on 04/21/2016  0.3 ng/mL on 10/15/2016  0.2 ng/mL on 04/15/2017  0.2 ng/mL on 12/01/2017 Component     Latest Ref Rng & Units 04/01/2019  PSA     0.10 - 4.00 ng/mL 0.26   Lab Results  Component Value Date   WBC 7.0 08/19/2019   HGB 14.5 08/19/2019   HCT 41.9  08/19/2019   MCV 89.1 08/19/2019   PLT 188.0 08/19/2019    Lab Results  Component Value Date   CREATININE 0.82 08/19/2019    Lab Results  Component Value Date   AST 7 08/19/2019   Lab Results  Component Value Date   ALT 19 08/19/2019   Urinalysis Component     Latest Ref Rng & Units 08/31/2019  Specific Gravity, UA     1.005 - 1.030 1.015    pH, UA     5.0 - 7.5 5.5  Color, UA     Yellow Yellow  Appearance Ur     Clear Clear  Leukocytes,UA     Negative Negative  Protein,UA     Negative/Trace Negative  Glucose, UA     Negative 3+ (A)  Ketones, UA     Negative Negative  RBC, UA     Negative Negative  Bilirubin, UA     Negative Negative  Urobilinogen, Ur     0.2 - 1.0 mg/dL 0.2  Nitrite, UA     Negative Negative  Microscopic Examination      See below:   Component     Latest Ref Rng & Units 08/31/2019  WBC, UA     0 - 5 /hpf 0-5  RBC     0 - 2 /hpf 0-2  Epithelial Cells (non renal)     0 - 10 /hpf 0-10  Renal Epithel, UA     None seen /hpf 0-10 (A)  Bacteria, UA     None seen/Few None seen    I have reviewed the labs  Pertinent Imaging CLINICAL DATA:  49 year old male with history of flank pain.  EXAM: CT ABDOMEN AND PELVIS WITHOUT CONTRAST  TECHNIQUE: Multidetector CT imaging of the abdomen and pelvis was performed following the standard protocol without IV contrast.  COMPARISON:  CT the chest, abdomen and pelvis 11/02/2015.  FINDINGS: Lower chest: Unremarkable.  Hepatobiliary: No suspicious cystic or solid hepatic lesions are confidently identified on today's noncontrast CT examination. Status post cholecystectomy.  Pancreas: No definite pancreatic mass or peripancreatic fluid collections or inflammatory changes are noted on today's noncontrast CT examination.  Spleen: Unremarkable.  Adrenals/Urinary Tract: 11 mm nonobstructive calculus in the upper pole collecting system of the left kidney. No additional calculi are noted  within the collecting system of the right kidney, along the course of either ureter or within the lumen of the urinary bladder. No hydroureteronephrosis. Unenhanced appearance of the kidneys and bilateral adrenal glands is otherwise normal. Unenhanced appearance of the urinary bladder is normal.  Stomach/Bowel: Unenhanced appearance of the stomach is normal. No pathologic dilatation of small bowel or colon. Normal appendix.  Vascular/Lymphatic: Aortic atherosclerosis. No lymphadenopathy noted in the abdomen or pelvis.  Reproductive: Prostate gland and seminal vesicles are unremarkable in appearance.  Other: No significant volume of ascites.  No pneumoperitoneum.  Musculoskeletal: There are no aggressive appearing lytic or blastic lesions noted in the visualized portions of the skeleton.  IMPRESSION: 1. 11 mm nonobstructive calculus in the upper pole collecting system of left kidney. No ureteral stones or findings of urinary tract obstruction are noted at this time. 2. Aortic atherosclerosis.   Electronically Signed   By: Vinnie Langton M.D.   On: 09/12/2019 09:56 I have independently reviewed the films.  See HPI.   Assessment & Plan:    1. Left renal stone We discussed various treatment options for his left renal stone including shockwave lithotripsy (SWL), ureteroscopy and laser lithotripsy with stent placement and PCNL.   We discussed that management is based on stone size, location, density, patient co-morbidities, and patient preference.   SWL has a lower stone free rate in a single procedure, but also a lower complication rate compared to ureteroscopy and PCNL and avoids a stent and associated stent related symptoms. Possible complications include renal hematoma, steinstrasse, and need for additional treatment.  Ureteroscopy with laser lithotripsy and stent placement has a higher stone free rate than  SWL in a single procedure, however increased complication  rate including possible infection, ureteral injury, bleeding, and stent related morbidity. Common stent related symptoms include dysuria, urgency/frequency, and flank pain.  Patient is insistent on undergoing PCNL against my advice and I stated I would speak with Dr. Erlene Quan to obtain her thoughts on his definitive treatment for his stone.  These notes generated with voice recognition software. I apologize for typographical errors.  Zara Council, PA-C  [facility] [address] [place], [place] [address] [phone]  I spent 30 minutes on the day of the encounter to include pre-visit record review, face-to-face time with the patient, and post-visit ordering of tests.

## 2019-09-14 ENCOUNTER — Other Ambulatory Visit: Payer: Self-pay

## 2019-09-14 ENCOUNTER — Ambulatory Visit: Payer: Commercial Managed Care - PPO | Admitting: Urology

## 2019-09-14 ENCOUNTER — Encounter: Payer: Self-pay | Admitting: Urology

## 2019-09-14 VITALS — BP 120/82 | HR 97 | Ht 72.0 in | Wt 232.0 lb

## 2019-09-14 DIAGNOSIS — N200 Calculus of kidney: Secondary | ICD-10-CM

## 2019-09-20 ENCOUNTER — Telehealth: Payer: Self-pay | Admitting: Urology

## 2019-09-20 NOTE — Telephone Encounter (Signed)
I have placed a booking sheet for ESWL for him on your desk.

## 2019-09-20 NOTE — Telephone Encounter (Signed)
Please let Ronald Pruitt know that I have spoken to Ronald Pruitt in regards to his kidney stone. She states that PCNL (going through your back) would be dangerous as the stone is next to the spleen and she does not advise this as a treatment option for his stone. He may either have ESWL or URS to treat the left-sided stone.

## 2019-09-20 NOTE — Telephone Encounter (Signed)
Patient would to due the ESWL. He does work 12 hours shifts, he states he will call back if he dont answer.

## 2019-09-21 ENCOUNTER — Other Ambulatory Visit: Payer: Self-pay | Admitting: Radiology

## 2019-09-21 DIAGNOSIS — N200 Calculus of kidney: Secondary | ICD-10-CM

## 2019-09-22 ENCOUNTER — Other Ambulatory Visit: Payer: Self-pay | Admitting: Radiology

## 2019-09-27 ENCOUNTER — Other Ambulatory Visit
Admission: RE | Admit: 2019-09-27 | Discharge: 2019-09-27 | Disposition: A | Discharge status: Home / Self Care | Payer: Commercial Managed Care - PPO | Source: Ambulatory Visit | Attending: Urology | Admitting: Urology

## 2019-09-27 ENCOUNTER — Other Ambulatory Visit: Payer: Self-pay

## 2019-09-27 DIAGNOSIS — Z01812 Encounter for preprocedural laboratory examination: Secondary | ICD-10-CM | POA: Diagnosis not present

## 2019-09-27 DIAGNOSIS — Z20822 Contact with and (suspected) exposure to covid-19: Secondary | ICD-10-CM | POA: Diagnosis not present

## 2019-09-27 LAB — SARS CORONAVIRUS 2 (TAT 6-24 HRS): SARS Coronavirus 2: NEGATIVE

## 2019-09-29 ENCOUNTER — Ambulatory Visit
Admission: RE | Admit: 2019-09-29 | Discharge: 2019-09-29 | Disposition: A | Discharge status: Home / Self Care | Payer: Commercial Managed Care - PPO | Attending: Urology | Admitting: Urology

## 2019-09-29 ENCOUNTER — Ambulatory Visit: Payer: Commercial Managed Care - PPO

## 2019-09-29 ENCOUNTER — Encounter
Admission: RE | Disposition: A | Discharge status: Home / Self Care | Payer: Self-pay | Source: Home / Self Care | Attending: Urology

## 2019-09-29 ENCOUNTER — Encounter: Payer: Self-pay | Admitting: Urology

## 2019-09-29 ENCOUNTER — Other Ambulatory Visit: Payer: Self-pay

## 2019-09-29 DIAGNOSIS — I10 Essential (primary) hypertension: Secondary | ICD-10-CM | POA: Insufficient documentation

## 2019-09-29 DIAGNOSIS — N200 Calculus of kidney: Secondary | ICD-10-CM

## 2019-09-29 DIAGNOSIS — Z794 Long term (current) use of insulin: Secondary | ICD-10-CM | POA: Insufficient documentation

## 2019-09-29 DIAGNOSIS — Z79899 Other long term (current) drug therapy: Secondary | ICD-10-CM | POA: Insufficient documentation

## 2019-09-29 DIAGNOSIS — Z6831 Body mass index (BMI) 31.0-31.9, adult: Secondary | ICD-10-CM | POA: Insufficient documentation

## 2019-09-29 DIAGNOSIS — Z87891 Personal history of nicotine dependence: Secondary | ICD-10-CM | POA: Diagnosis not present

## 2019-09-29 DIAGNOSIS — Z9049 Acquired absence of other specified parts of digestive tract: Secondary | ICD-10-CM | POA: Diagnosis not present

## 2019-09-29 DIAGNOSIS — N400 Enlarged prostate without lower urinary tract symptoms: Secondary | ICD-10-CM | POA: Diagnosis not present

## 2019-09-29 DIAGNOSIS — E119 Type 2 diabetes mellitus without complications: Secondary | ICD-10-CM | POA: Diagnosis not present

## 2019-09-29 DIAGNOSIS — K219 Gastro-esophageal reflux disease without esophagitis: Secondary | ICD-10-CM | POA: Insufficient documentation

## 2019-09-29 DIAGNOSIS — G4730 Sleep apnea, unspecified: Secondary | ICD-10-CM | POA: Insufficient documentation

## 2019-09-29 DIAGNOSIS — E785 Hyperlipidemia, unspecified: Secondary | ICD-10-CM | POA: Diagnosis not present

## 2019-09-29 DIAGNOSIS — E669 Obesity, unspecified: Secondary | ICD-10-CM | POA: Insufficient documentation

## 2019-09-29 DIAGNOSIS — Z8379 Family history of other diseases of the digestive system: Secondary | ICD-10-CM | POA: Insufficient documentation

## 2019-09-29 DIAGNOSIS — M1990 Unspecified osteoarthritis, unspecified site: Secondary | ICD-10-CM | POA: Diagnosis not present

## 2019-09-29 DIAGNOSIS — Z9852 Vasectomy status: Secondary | ICD-10-CM | POA: Insufficient documentation

## 2019-09-29 DIAGNOSIS — Z833 Family history of diabetes mellitus: Secondary | ICD-10-CM | POA: Insufficient documentation

## 2019-09-29 HISTORY — PX: EXTRACORPOREAL SHOCK WAVE LITHOTRIPSY: SHX1557

## 2019-09-29 LAB — GLUCOSE, CAPILLARY: Glucose-Capillary: 162 mg/dL — ABNORMAL HIGH (ref 70–99)

## 2019-09-29 SURGERY — LITHOTRIPSY, ESWL
Anesthesia: Moderate Sedation | Laterality: Left

## 2019-09-29 MED ORDER — DIPHENHYDRAMINE HCL 25 MG PO CAPS: 25.0000 mg | ORAL_CAPSULE | ORAL | Status: AC

## 2019-09-29 MED ORDER — CIPROFLOXACIN HCL 500 MG PO TABS: 500.0000 mg | ORAL_TABLET | ORAL | Status: AC

## 2019-09-29 MED ORDER — TAMSULOSIN HCL 0.4 MG PO CAPS: 0.4000 mg | ORAL_CAPSULE | Freq: Every day | ORAL | 0 refills | Status: DC

## 2019-09-29 MED ORDER — HYDROCODONE-ACETAMINOPHEN 5-325 MG PO TABS: 1.0000 | ORAL_TABLET | Freq: Four times a day (QID) | ORAL | 0 refills | Status: DC | PRN

## 2019-09-29 MED ORDER — CIPROFLOXACIN HCL 500 MG PO TABS
ORAL_TABLET | ORAL | Status: AC
  Administered 2019-09-29: 500 mg via ORAL
  Filled 2019-09-29: qty 1

## 2019-09-29 MED ORDER — DIAZEPAM 5 MG PO TABS
ORAL_TABLET | ORAL | Status: AC
  Administered 2019-09-29: 10 mg via ORAL
  Filled 2019-09-29: qty 2

## 2019-09-29 MED ORDER — DIPHENHYDRAMINE HCL 25 MG PO CAPS
ORAL_CAPSULE | ORAL | Status: AC
  Administered 2019-09-29: 25 mg via ORAL
  Filled 2019-09-29: qty 1

## 2019-09-29 MED ORDER — ONDANSETRON HCL 4 MG/2ML IJ SOLN: 4.0000 mg | Freq: Once | INTRAMUSCULAR | Status: AC | PRN

## 2019-09-29 MED ORDER — DIAZEPAM 5 MG PO TABS: 10.0000 mg | ORAL_TABLET | ORAL | Status: AC

## 2019-09-29 MED ORDER — ONDANSETRON HCL 4 MG/2ML IJ SOLN
INTRAMUSCULAR | Status: AC
  Administered 2019-09-29: 4 mg via INTRAVENOUS
  Filled 2019-09-29: qty 2

## 2019-09-29 MED ORDER — SODIUM CHLORIDE 0.9 % IV SOLN: INTRAVENOUS | Status: DC

## 2019-09-29 NOTE — Discharge Instructions (Signed)
See [facility] discharge instructions in chart.  

## 2019-09-29 NOTE — Interval H&P Note (Signed)
History and Physical Interval Note:  09/29/2019 11:26 AM  Ronald Pruitt.  has presented today for surgery, with the diagnosis of Kidney stone.  The various methods of treatment have been discussed with the patient and family. After consideration of risks, benefits and other options for treatment, the patient has consented to  Procedure(s): EXTRACORPOREAL SHOCK WAVE LITHOTRIPSY (ESWL) (Left) as a surgical intervention.  The patient's history has been reviewed, patient examined, no change in status, stable for surgery.  I have reviewed the patient's chart and labs.  Questions were answered to the patient's satisfaction.     Hollice Espy

## 2019-10-06 ENCOUNTER — Encounter: Payer: Self-pay | Admitting: Podiatry

## 2019-10-06 ENCOUNTER — Ambulatory Visit: Payer: Commercial Managed Care - PPO | Admitting: Podiatry

## 2019-10-06 ENCOUNTER — Other Ambulatory Visit: Payer: Self-pay

## 2019-10-06 DIAGNOSIS — Q666 Other congenital valgus deformities of feet: Secondary | ICD-10-CM

## 2019-10-06 DIAGNOSIS — M722 Plantar fascial fibromatosis: Secondary | ICD-10-CM | POA: Diagnosis not present

## 2019-10-06 DIAGNOSIS — M79671 Pain in right foot: Secondary | ICD-10-CM | POA: Diagnosis not present

## 2019-10-11 ENCOUNTER — Encounter: Payer: Self-pay | Admitting: Podiatry

## 2019-10-11 NOTE — Progress Notes (Signed)
Subjective:  Patient ID: Ronald Pruitt., male    DOB: 24-Jul-1970,  MRN: [id number]  Chief Complaint  Patient presents with  . Plantar Fasciitis    "its no better, it has good and bad days"    49 y.o. male presents with the above complaint.  Patient presents with follow-up of plantar fasciitis.  Patient states that he is maybe 50% better.  Overall his pain is still on and off.  He denies any other acute complaints.  The plantar fascial brace helped a lot.  He has been wearing it without acute complaints.   Review of Systems: Negative except as noted in the HPI. Denies N/V/F/Ch.  Past Medical History:  Diagnosis Date  . Arthritis   . BPH (benign prostatic hyperplasia)   . Diabetes ([place])   . Dysuria   . GERD (gastroesophageal reflux disease)   . History of kidney stones   . HTN (hypertension)   . Hyperlipidemia   . Impotence   . Nocturia   . Obesity   . Pneumonia age 76's  . Sleep apnea    CPAP - severe (sleep study 01/11/16)  . Testicular hypofunction   . Wears dentures    full upper and lower    Current Outpatient Medications:  .  amLODipine (NORVASC) 10 MG tablet, Take 1 tablet by mouth once daily, Disp: 90 tablet, Rfl: 0 .  atorvastatin (LIPITOR) 80 MG tablet, Take 1 tablet (80 mg total) by mouth daily at 6 PM., Disp: 90 tablet, Rfl: 1 .  Blood Glucose Monitoring Suppl (ONETOUCH VERIO) w/Device KIT, 1 Device by Does not apply route daily., Disp: 1 kit, Rfl: 0 .  folic acid (FOLVITE) 1 MG tablet, Take 1 tablet (1 mg total) by mouth daily., Disp: 90 tablet, Rfl: 3 .  glucose blood test strip, Use to check blood sugar up to 3 times daily, Disp: 300 each, Rfl: 3 .  HYDROcodone-acetaminophen (NORCO/VICODIN) 5-325 MG tablet, Take 1-2 tablets by mouth every 6 (six) hours as needed for moderate pain., Disp: 10 tablet, Rfl: 0 .  Insulin Glargine (LANTUS) 100 UNIT/ML Solostar Pen, Inject 48 units into the skin daily., Disp: 54 mL, Rfl: 11 .  metFORMIN (GLUCOPHAGE-XR) 500 MG  24 hr tablet, Take 2 tablets (1,000 mg total) by mouth 2 (two) times daily., Disp: 360 tablet, Rfl: 1 .  omeprazole (PRILOSEC) 20 MG capsule, Take 1 capsule by mouth once daily, Disp: 90 capsule, Rfl: 0 .  Semaglutide,0.25 or 0.5MG/DOS, (OZEMPIC, 0.25 OR 0.5 MG/DOSE,) 2 MG/1.5ML SOPN, Inject 0.1875 mLs (0.25 mg total) into the skin once a week., Disp: 1 pen, Rfl: 3 .  tadalafil (CIALIS) 20 MG tablet, TAKE 1 TABLET BY MOUTH ONCE DAILY AS NEEDED FOR  ERECTILE  DYSFUNCTION.  EFFECTS  MAY  LAST  UP  TO  36  HOURS, Disp: 30 tablet, Rfl: 0 .  tadalafil (CIALIS) 5 MG tablet, Take 1 tablet (5 mg total) by mouth daily as needed for erectile dysfunction., Disp: 30 tablet, Rfl: 11 .  tamsulosin (FLOMAX) 0.4 MG CAPS capsule, Take 1 capsule (0.4 mg total) by mouth daily., Disp: 30 capsule, Rfl: 0  Social History   Tobacco Use  Smoking Status Current Every Day Smoker  . Packs/day: 0.50  . Years: 20.00  . Pack years: 10.00  . Types: Cigarettes  Smokeless Tobacco Never Used    No Known Allergies Objective:  There were no vitals filed for this visit. There is no height or weight on file to calculate  BMI. Constitutional Well developed. Well nourished.  Vascular Dorsalis pedis pulses palpable bilaterally. Posterior tibial pulses palpable bilaterally. Capillary refill normal to all digits.  No cyanosis or clubbing noted. Pedal hair growth normal.  Neurologic Normal speech. Oriented to person, place, and time. Epicritic sensation to light touch grossly present bilaterally.  Dermatologic Nails well groomed and normal in appearance. No open wounds. No skin lesions.  Orthopedic: Normal joint ROM without pain or crepitus bilaterally. No visible deformities. Tender to palpation at the calcaneal tuber right. No pain with calcaneal squeeze right. Ankle ROM diminished range of motion right. Silfverskiold Test: positive right.   Radiographs: Taken and reviewed. No acute fractures or dislocations. No  evidence of stress fracture.  Plantar heel spur present. Posterior heel spur present.   Assessment:   1. Plantar fasciitis of right foot   2. Foot pain, right   3. Pes planovalgus    Plan:  Patient was evaluated and treated and all questions answered.  Plantar Fasciitis, right - XR reviewed as above.  - Educated on icing and stretching. Instructions given.  - Second injection delivered to the plantar fascia as below. - DME: Night splint - Pharmacologic management: None. Educated on risks/benefits and proper taking of medication.  Pes planovalgus semiflexible -Explained to patient the etiology of pes planovalgus and various treatment options were discussed.  I am related the pes planovalgus on the driving force behind the plantar fasciitis.  I believe patient will benefit from custom-made orthotics to help control the hindfoot motion as well as support the arches of the foot. -Patient will be scheduled for custom-made orthotics  Procedure: Injection Tendon/Ligament Location: Right plantar fascia at the glabrous junction; medial approach. Skin Prep: alcohol Injectate: 0.5 cc 0.5% marcaine plain, 0.5 cc of 1% Lidocaine, 0.5 cc kenalog 10. Disposition: Patient tolerated procedure well. Injection site dressed with a band-aid.  No follow-ups on file.

## 2019-10-20 NOTE — Progress Notes (Signed)
10/21/2019 1:20 PM   Ronald Pruitt. 07/15/1970 [id number]  Referring provider: Burnard Hawthorne, FNP [address] [place],  [place] [id number]  Chief Complaint  Patient presents with  . Nephrolithiasis    HPI: Ronald Pruitt. is a 49 y.o. who is status post ESWL who presents today for follow up.  Underwent ESWL on 09/29/2019 for left renal stone with Dr. Erlene Quan.  Their postprocedural course was as expected and uneventful.  They have passed fragments.  They bring in fragments for analysis.   KUB the left 12 mm calculus appears unchanged    PMH: Past Medical History:  Diagnosis Date  . Arthritis   . BPH (benign prostatic hyperplasia)   . Diabetes ([place])   . Dysuria   . GERD (gastroesophageal reflux disease)   . History of kidney stones   . HTN (hypertension)   . Hyperlipidemia   . Impotence   . Nocturia   . Obesity   . Pneumonia age 28's  . Sleep apnea    CPAP - severe (sleep study 01/11/16)  . Testicular hypofunction   . Wears dentures    full upper and lower    Surgical History: Past Surgical History:  Procedure Laterality Date  . CHOLECYSTECTOMY    . COLONOSCOPY WITH PROPOFOL N/A 10/12/2018   Procedure: COLONOSCOPY WITH PROPOFOL;  Surgeon: Lucilla Lame, MD;  Location: [facility] ENDOSCOPY;  Service: Endoscopy;  Laterality: N/A;  . EXTRACORPOREAL SHOCK WAVE LITHOTRIPSY Left 09/29/2019   Procedure: EXTRACORPOREAL SHOCK WAVE LITHOTRIPSY (ESWL);  Surgeon: Hollice Espy, MD;  Location: [facility] ORS;  Service: Urology;  Laterality: Left;  . NASAL SEPTOPLASTY W/ TURBINOPLASTY N/A 05/20/2017   Procedure: NASAL SEPTOPLASTY WITH SUBMUCOUS RESECTION;  Surgeon: Clyde Canterbury, MD;  Location: [facility] ORS;  Service: ENT;  Laterality: N/A;  . VASECTOMY      Home Medications:  Current Outpatient Medications on File Prior to Visit  Medication Sig Dispense Refill  . amLODipine (NORVASC) 10 MG tablet Take 1 tablet by mouth once daily 90 tablet 0  . atorvastatin  (LIPITOR) 80 MG tablet Take 1 tablet (80 mg total) by mouth daily at 6 PM. 90 tablet 1  . Blood Glucose Monitoring Suppl (ONETOUCH VERIO) w/Device KIT 1 Device by Does not apply route daily. 1 kit 0  . folic acid (FOLVITE) 1 MG tablet Take 1 tablet (1 mg total) by mouth daily. 90 tablet 3  . glucose blood test strip Use to check blood sugar up to 3 times daily 300 each 3  . HYDROcodone-acetaminophen (NORCO/VICODIN) 5-325 MG tablet Take 1-2 tablets by mouth every 6 (six) hours as needed for moderate pain. 10 tablet 0  . Insulin Glargine (LANTUS) 100 UNIT/ML Solostar Pen Inject 48 units into the skin daily. 54 mL 11  . metFORMIN (GLUCOPHAGE-XR) 500 MG 24 hr tablet Take 2 tablets (1,000 mg total) by mouth 2 (two) times daily. 360 tablet 1  . omeprazole (PRILOSEC) 20 MG capsule Take 1 capsule by mouth once daily 90 capsule 0  . Semaglutide,0.25 or 0.5MG/DOS, (OZEMPIC, 0.25 OR 0.5 MG/DOSE,) 2 MG/1.5ML SOPN Inject 0.1875 mLs (0.25 mg total) into the skin once a week. 1 pen 3  . tadalafil (CIALIS) 20 MG tablet TAKE 1 TABLET BY MOUTH ONCE DAILY AS NEEDED FOR  ERECTILE  DYSFUNCTION.  EFFECTS  MAY  LAST  UP  TO  36  HOURS 30 tablet 0  . tadalafil (CIALIS) 5 MG tablet Take 1 tablet (5 mg total) by mouth daily as  needed for erectile dysfunction. 30 tablet 11  . tamsulosin (FLOMAX) 0.4 MG CAPS capsule Take 1 capsule (0.4 mg total) by mouth daily. 30 capsule 0   No current facility-administered medications on file prior to visit.    Allergies: No Known Allergies  Family History: Family History  Problem Relation Age of Onset  . Hyperlipidemia Mother   . Diabetes Mellitus II Mother   . Seizures Mother   . Diabetes Mother   . Mental illness Mother   . Arthritis Mother   . Diabetes Father   . Cirrhosis Father        non alcoholic  . Hyperlipidemia Father   . Arthritis Father   . Liver cancer Father   . Diabetes Maternal Grandmother   . Hyperlipidemia Maternal Grandmother   . Arthritis Maternal  Grandmother   . Diabetes Maternal Grandfather   . Hyperlipidemia Maternal Grandfather   . Arthritis Maternal Grandfather   . Diabetes Paternal Grandmother   . Hyperlipidemia Paternal Grandmother   . Arthritis Paternal Grandmother   . Diabetes Paternal Grandfather   . Hyperlipidemia Paternal Grandfather   . Arthritis Paternal Grandfather   . Kidney disease Neg Hx   . Prostate cancer Neg Hx   . Kidney cancer Neg Hx   . Bladder Cancer Neg Hx   . Colon cancer Neg Hx   . Thyroid cancer Neg Hx     Social History:  reports that he has been smoking cigarettes. He has a 10.00 pack-year smoking history. He has never used smokeless tobacco. He reports that he does not drink alcohol and does not use drugs.  ROS: Pertinent ROS in HPI  Physical Exam: BP (!) 135/92   Pulse 85   Ht '6\' 1"'  (1.854 m)   Wt 235 lb (106.6 kg)   BMI 31.00 kg/m   Constitutional:  Well nourished. Alert and oriented, No acute distress. HEENT: [place] AT, mask in place.   Trachea midline. Cardiovascular: No clubbing, cyanosis, or edema. Respiratory: Normal respiratory effort, no increased work of breathing. Neurologic: Grossly intact, no focal deficits, moving all 4 extremities. Psychiatric: Normal mood and affect.  Laboratory Data: Lab Results  Component Value Date   WBC 7.0 08/19/2019   HGB 14.5 08/19/2019   HCT 41.9 08/19/2019   MCV 89.1 08/19/2019   PLT 188.0 08/19/2019    Lab Results  Component Value Date   CREATININE 0.82 08/19/2019       Component Value Date/Time   CHOL 189 04/01/2019 1000   HDL 33.70 (L) 04/01/2019 1000   CHOLHDL 6 04/01/2019 1000   VLDL 52.8 (H) 04/01/2019 1000    Urinalysis    Component Value Date/Time   COLORURINE COLORLESS (A) 03/21/2018 2200   APPEARANCEUR Clear 08/31/2019 0835   LABSPEC 1.029 03/21/2018 2200   PHURINE 7.0 03/21/2018 2200   GLUCOSEU 3+ (A) 08/31/2019 0835   GLUCOSEU >=1000 (A) 03/11/2017 1206   HGBUR NEGATIVE 03/21/2018 2200   BILIRUBINUR Negative  08/31/2019 0835   KETONESUR NEGATIVE 03/21/2018 2200   PROTEINUR Negative 08/31/2019 0835   PROTEINUR NEGATIVE 03/21/2018 2200   UROBILINOGEN 0.2 03/11/2017 1206   NITRITE Negative 08/31/2019 0835   NITRITE NEGATIVE 03/21/2018 2200   LEUKOCYTESUR Negative 08/31/2019 0835   LEUKOCYTESUR NEGATIVE 03/21/2018 2200    I have reviewed the labs.   Pertinent Imaging: Narrative & Impression  CLINICAL DATA:  Kidney stones. Recent stones. Patient cannot remember which side. Pain in the LOWER back.  EXAM: ABDOMEN - 1 VIEW  COMPARISON:  09/29/2019  FINDINGS: Surgical clips are seen in the RIGHT UPPER QUADRANT of the abdomen. Calcification measuring 1.2 centimeter overlies the region of the UPPER pole of the LEFT kidney and appear stable. Region of the RIGHT kidney is unremarkable. No evidence for ureteral stones.  IMPRESSION: 1. Stable appearance of LEFT intrarenal calculus. 2. No evidence for ureteral stones.   Electronically Signed   By: Nolon Nations M.D.   On: 10/21/2019 15:07    I have independently reviewed the films.  See HPI.  Assessment & Plan:    1. Left renal stone - s/p left ESWL - stone remains but appears less dense on today's film - he had some fragments and they were send for analysis - RTC in one month for KUB  - he is interested in re treatment if stone does not pass  Return in about 1 month (around 11/20/2019) for KUB and office visit .  These notes generated with voice recognition software. I apologize for typographical errors.  Zara Council, PA-C  [facility] [address]  [place], [place] [address] [phone]

## 2019-10-21 ENCOUNTER — Other Ambulatory Visit: Payer: Self-pay

## 2019-10-21 ENCOUNTER — Ambulatory Visit
Admission: RE | Admit: 2019-10-21 | Discharge: 2019-10-21 | Disposition: A | Discharge status: Home / Self Care | Payer: Commercial Managed Care - PPO | Source: Ambulatory Visit | Attending: Urology | Admitting: Urology

## 2019-10-21 ENCOUNTER — Ambulatory Visit
Admission: RE | Admit: 2019-10-21 | Discharge: 2019-10-21 | Disposition: A | Discharge status: Home / Self Care | Payer: Commercial Managed Care - PPO | Attending: Urology | Admitting: Urology

## 2019-10-21 ENCOUNTER — Ambulatory Visit (INDEPENDENT_AMBULATORY_CARE_PROVIDER_SITE_OTHER): Payer: Commercial Managed Care - PPO | Admitting: Urology

## 2019-10-21 VITALS — BP 135/92 | HR 85 | Ht 73.0 in | Wt 235.0 lb

## 2019-10-21 DIAGNOSIS — N200 Calculus of kidney: Secondary | ICD-10-CM

## 2019-10-26 ENCOUNTER — Other Ambulatory Visit: Payer: Commercial Managed Care - PPO | Admitting: Orthotics

## 2019-10-27 ENCOUNTER — Encounter: Payer: Self-pay | Admitting: Urology

## 2019-10-27 LAB — STONE ANALYSIS
Calcium Oxalate Dihydrate: 20 %
Calcium Oxalate Monohydrate: 80 %
Weight Calculi: 10 mg

## 2019-11-08 ENCOUNTER — Ambulatory Visit: Payer: Commercial Managed Care - PPO | Admitting: Podiatry

## 2019-11-17 ENCOUNTER — Telehealth: Payer: Self-pay | Admitting: Family

## 2019-11-17 NOTE — Telephone Encounter (Signed)
Patient has appointment  11/18/19 needs Micro albumin and last A1c >9 can we refer to Catie? Needs flu vaccine.

## 2019-11-18 ENCOUNTER — Other Ambulatory Visit: Payer: Self-pay

## 2019-11-18 ENCOUNTER — Encounter: Payer: Self-pay | Admitting: Family

## 2019-11-18 ENCOUNTER — Ambulatory Visit (INDEPENDENT_AMBULATORY_CARE_PROVIDER_SITE_OTHER): Payer: Commercial Managed Care - PPO | Admitting: Family

## 2019-11-18 ENCOUNTER — Telehealth: Payer: Self-pay

## 2019-11-18 VITALS — BP 152/110 | HR 101 | Temp 98.1°F | Ht 72.0 in | Wt 231.8 lb

## 2019-11-18 DIAGNOSIS — E119 Type 2 diabetes mellitus without complications: Secondary | ICD-10-CM | POA: Diagnosis not present

## 2019-11-18 DIAGNOSIS — Z794 Long term (current) use of insulin: Secondary | ICD-10-CM

## 2019-11-18 DIAGNOSIS — I10 Essential (primary) hypertension: Secondary | ICD-10-CM | POA: Diagnosis not present

## 2019-11-18 DIAGNOSIS — E785 Hyperlipidemia, unspecified: Secondary | ICD-10-CM

## 2019-11-18 DIAGNOSIS — E1140 Type 2 diabetes mellitus with diabetic neuropathy, unspecified: Secondary | ICD-10-CM | POA: Diagnosis not present

## 2019-11-18 DIAGNOSIS — E1165 Type 2 diabetes mellitus with hyperglycemia: Secondary | ICD-10-CM | POA: Diagnosis not present

## 2019-11-18 LAB — POCT GLYCOSYLATED HEMOGLOBIN (HGB A1C): Hemoglobin A1C: 8.1 % — AB (ref 4.0–5.6)

## 2019-11-18 MED ORDER — METFORMIN HCL ER 500 MG PO TB24: 2000.0000 mg | ORAL_TABLET | Freq: Every evening | ORAL | 1 refills | Status: DC

## 2019-11-18 MED ORDER — OZEMPIC (0.25 OR 0.5 MG/DOSE) 2 MG/1.5ML ~~LOC~~ SOPN: 0.5000 mg | PEN_INJECTOR | SUBCUTANEOUS | 3 refills | Status: DC

## 2019-11-18 MED ORDER — LOSARTAN POTASSIUM 50 MG PO TABS: 25.0000 mg | ORAL_TABLET | Freq: Every day | ORAL | 3 refills | Status: DC

## 2019-11-18 MED ORDER — GABAPENTIN 100 MG PO CAPS: 100.0000 mg | ORAL_CAPSULE | Freq: Three times a day (TID) | ORAL | 3 refills | Status: DC

## 2019-11-18 NOTE — Progress Notes (Signed)
Subjective:    Patient ID: Ronald Pruitt., male    DOB: [birth year], 49 y.o.   MRN: [id number]  CC: Reason Helzer. is a 49 y.o. male who presents today for follow up.   HPI:  Complains of right great toe numbness for a year. Most bothersome during the day. No wounds. Follows with Dr Posey Pronto [facility] for plantar fasciitis. Some improvement with brace and corticosteroid injections.    DM- Feels improved. takes lantus 48 units qpm. No hypoglycemic episodes. Hasnt had working glucometer has been broken. No blurry vision, polyuria.  Compliant with metformin 1027m bid , however causes loose stools.    HLD- compliant with lipitor 866m HTN- takes amlodipine 1033mn the afternoon. No leg swelling, cp. Sob.   Follows with Urology     HISTORY:  Past Medical History:  Diagnosis Date  . Arthritis   . BPH (benign prostatic hyperplasia)   . Diabetes ([place] . Dysuria   . GERD (gastroesophageal reflux disease)   . History of kidney stones   . HTN (hypertension)   . Hyperlipidemia   . Impotence   . Nocturia   . Obesity   . Pneumonia age 72'57's Sleep apnea    CPAP - severe (sleep study 01/11/16)  . Testicular hypofunction   . Wears dentures    full upper and lower   Past Surgical History:  Procedure Laterality Date  . CHOLECYSTECTOMY    . COLONOSCOPY WITH PROPOFOL N/A 10/12/2018   Procedure: COLONOSCOPY WITH PROPOFOL;  Surgeon: WohLucilla LameD;  [facility];  Service: Endoscopy;  Laterality: N/A;  . EXTRACORPOREAL SHOCK WAVE LITHOTRIPSY Left 09/29/2019   Procedure: EXTRACORPOREAL SHOCK WAVE LITHOTRIPSY (ESWL);  Surgeon: BraHollice EspyD;  Location: [facility] ORS;  Service: Urology;  Laterality: Left;  . NASAL SEPTOPLASTY W/ TURBINOPLASTY N/A 05/20/2017   Procedure: NASAL SEPTOPLASTY WITH SUBMUCOUS RESECTION;  Surgeon: BenClyde CanterburyD;  Location: [facility] ORS;  Service: ENT;  Laterality: N/A;  . VASECTOMY     Family History  Problem Relation Age of Onset  .  Hyperlipidemia Mother   . Diabetes Mellitus II Mother   . Seizures Mother   . Diabetes Mother   . Mental illness Mother   . Arthritis Mother   . Diabetes Father   . Cirrhosis Father        non alcoholic  . Hyperlipidemia Father   . Arthritis Father   . Liver cancer Father   . Diabetes Maternal Grandmother   . Hyperlipidemia Maternal Grandmother   . Arthritis Maternal Grandmother   . Diabetes Maternal Grandfather   . Hyperlipidemia Maternal Grandfather   . Arthritis Maternal Grandfather   . Diabetes Paternal Grandmother   . Hyperlipidemia Paternal Grandmother   . Arthritis Paternal Grandmother   . Diabetes Paternal Grandfather   . Hyperlipidemia Paternal Grandfather   . Arthritis Paternal Grandfather   . Kidney disease Neg Hx   . Prostate cancer Neg Hx   . Kidney cancer Neg Hx   . Bladder Cancer Neg Hx   . Colon cancer Neg Hx   . Thyroid cancer Neg Hx     Allergies: Patient has no known allergies. Current Outpatient Medications on File Prior to Visit  Medication Sig Dispense Refill  . amLODipine (NORVASC) 10 MG tablet Take 1 tablet by mouth once daily 90 tablet 0  . atorvastatin (LIPITOR) 80 MG tablet Take 1 tablet (80 mg total) by mouth daily at 6 PM. 90 tablet 1  .  Blood Glucose Monitoring Suppl (ONETOUCH VERIO) w/Device KIT 1 Device by Does not apply route daily. 1 kit 0  . folic acid (FOLVITE) 1 MG tablet Take 1 tablet (1 mg total) by mouth daily. 90 tablet 3  . glucose blood test strip Use to check blood sugar up to 3 times daily 300 each 3  . Insulin Glargine (LANTUS) 100 UNIT/ML Solostar Pen Inject 48 units into the skin daily. 54 mL 11  . omeprazole (PRILOSEC) 20 MG capsule Take 1 capsule by mouth once daily 90 capsule 0  . tadalafil (CIALIS) 20 MG tablet TAKE 1 TABLET BY MOUTH ONCE DAILY AS NEEDED FOR  ERECTILE  DYSFUNCTION.  EFFECTS  MAY  LAST  UP  TO  36  HOURS 30 tablet 0  . tadalafil (CIALIS) 5 MG tablet Take 1 tablet (5 mg total) by mouth daily as needed for  erectile dysfunction. 30 tablet 11   No current facility-administered medications on file prior to visit.    Social History   Tobacco Use  . Smoking status: Current Every Day Smoker    Packs/day: 0.50    Years: 20.00    Pack years: 10.00    Types: Cigarettes  . Smokeless tobacco: Never Used  Vaping Use  . Vaping Use: Former  Substance Use Topics  . Alcohol use: No    Alcohol/week: 0.0 standard drinks  . Drug use: Never    Review of Systems  Constitutional: Negative for chills and fever.  Eyes: Negative for visual disturbance.  Respiratory: Negative for cough.   Cardiovascular: Negative for chest pain and palpitations.  Gastrointestinal: Negative for nausea and vomiting.  Endocrine: Negative for polydipsia, polyphagia and polyuria.  Neurological: Positive for numbness. Negative for headaches.      Objective:    BP (!) 152/110   Pulse (!) 101   Temp 98.1 F (36.7 C)   Ht 6' (1.829 m)   Wt 231 lb 12.8 oz (105.1 kg)   SpO2 98%   BMI 31.44 kg/m  BP Readings from Last 3 Encounters:  11/18/19 (!) 152/110  10/21/19 (!) 135/92  09/29/19 (!) 125/92   Wt Readings from Last 3 Encounters:  11/18/19 231 lb 12.8 oz (105.1 kg)  10/21/19 235 lb (106.6 kg)  09/29/19 231 lb 7.7 oz (105 kg)    Physical Exam Vitals reviewed.  Constitutional:      Appearance: He is well-developed.  Cardiovascular:     Rate and Rhythm: Regular rhythm.     Heart sounds: Normal heart sounds.  Pulmonary:     Effort: Pulmonary effort is normal. No respiratory distress.     Breath sounds: Normal breath sounds. No wheezing, rhonchi or rales.  Skin:    General: Skin is warm and dry.  Neurological:     Mental Status: He is alert.  Psychiatric:        Speech: Speech normal.        Behavior: Behavior normal.        Assessment & Plan:   Problem List Items Addressed This Visit      Cardiovascular and Mediastinum   HTN (hypertension)    Uncontrolled. Start losartan 20m. Continue  amlodipine 128m       Relevant Medications   losartan (COZAAR) 50 MG tablet     Endocrine   DM (diabetes mellitus) ([place]    Lab Results  Component Value Date   HGBA1C 8.1 (A) 11/18/2019   Improved. Continue lantus 48 units, metformin 200030mhs ( changed  to qhs versus BID to see if mitigates loose stools). Increase ozempic to 0.54m.Consult with Catie TDarnelle Maffucci pharm D. Close follow up.      Relevant Medications   gabapentin (NEURONTIN) 100 MG capsule   Semaglutide,0.25 or 0.5MG/DOS, (OZEMPIC, 0.25 OR 0.5 MG/DOSE,) 2 MG/1.5ML SOPN   metFORMIN (GLUCOPHAGE-XR) 500 MG 24 hr tablet   losartan (COZAAR) 50 MG tablet   Other Relevant Orders   POCT HgB A1C (Completed)   Referral to Chronic Care Management Services   Comprehensive metabolic panel   Lipid panel   Microalbumin / creatinine urine ratio   Type 2 diabetes mellitus with diabetic neuropathy, unspecified (HCC)    Trial gabapentin 1030mtid      Relevant Medications   Semaglutide,0.25 or 0.5MG/DOS, (OZEMPIC, 0.25 OR 0.5 MG/DOSE,) 2 MG/1.5ML SOPN   metFORMIN (GLUCOPHAGE-XR) 500 MG 24 hr tablet   losartan (COZAAR) 50 MG tablet   Other Relevant Orders   POCT HgB A1C (Completed)   Referral to Chronic Care Management Services   Comprehensive metabolic panel   Lipid panel   Microalbumin / creatinine urine ratio     Other   HLD (hyperlipidemia)    Uncontrolled. Continue lipitor 8058m      Relevant Medications   losartan (COZAAR) 50 MG tablet       I have discontinued RobOkey Regaltewart Jr.'s HYDROcodone-acetaminophen and tamsulosin. I have also changed his Ozempic (0.25 or 0.5 MG/DOSE) and metFORMIN. Additionally, I am having him start on gabapentin and losartan. Lastly, I am having him maintain his OneTouch Verio, glucose blood, insulin glargine, folic acid, atorvastatin, tadalafil, amLODipine, tadalafil, and omeprazole.   Meds ordered this encounter  Medications  . gabapentin (NEURONTIN) 100 MG capsule     Sig: Take 1 capsule (100 mg total) by mouth 3 (three) times daily.    Dispense:  90 capsule    Refill:  3    Order Specific Question:   Supervising Provider    Answer:   TULDeborra Medina[[id number]]  . Semaglutide,0.25 or 0.5MG/DOS, (OZEMPIC, 0.25 OR 0.5 MG/DOSE,) 2 MG/1.5ML SOPN    Sig: Inject 0.5 mg into the skin once a week.    Dispense:  1.5 mL    Refill:  3    Order Specific Question:   Supervising Provider    Answer:   TULDerrel NipERESA L [[id number]]  . metFORMIN (GLUCOPHAGE-XR) 500 MG 24 hr tablet    Sig: Take 4 tablets (2,000 mg total) by mouth every evening.    Dispense:  360 tablet    Refill:  1    Order Specific Question:   Supervising Provider    Answer:   TULDeborra Medina[[id number]]  . losartan (COZAAR) 50 MG tablet    Sig: Take 0.5 tablets (25 mg total) by mouth daily.    Dispense:  90 tablet    Refill:  3    Order Specific Question:   Supervising Provider    Answer:   TULCrecencio [id number]]    Return precautions given.   Risks, benefits, and alternatives of the medications and treatment plan prescribed today were discussed, and patient expressed understanding.   Education regarding symptom management and diagnosis given to patient on AVS.  Continue to follow with ArnBurnard HawthorneNP for routine health maintenance.   RobBenjie Karvonennd I agreed with plan.   MarMable ParisNP

## 2019-11-18 NOTE — Chronic Care Management (AMB) (Signed)
  Chronic Care Management   Outreach Note  11/18/2019 Name: Lejon Afzal. MRN: [id number] DOB: [birth year]  Ronald Pruitt. is a 49 y.o. year old male who is a primary care patient of Burnard Hawthorne, FNP. I reached out to [name]. by phone today in response to a referral sent by Mr. Lenore Manner Jr.'s PCP, Mable Paris, FNP      An unsuccessful telephone outreach was attempted today. The patient was referred to the case management team for assistance with care management and care coordination.   Follow Up Plan: A HIPAA compliant phone message was left for the patient providing contact information and requesting a return call.  The care management team will reach out to the patient again over the next 7 days.  If patient returns call to provider office, please advise to call [place] * at [phone]*  Noreene Larsson, [place], [place] [facility]  [place], [place] [address] Direct Dial: [phone] [email or web address]: [email or web address]

## 2019-11-18 NOTE — Patient Instructions (Addendum)
Start losartan 25mg  in addition to amlodipine It is imperative that you are seen AT least twice per year for labs and monitoring. Monitor blood pressure at home and me 5-6 reading on separate days. Goal is less than 120/80, based on newest guidelines, however we certainly want to be less than 130/80;  if persistently higher, please make sooner follow up appointment so we can recheck you blood pressure and manage/ adjust medications.  Increase ozempic to 0.5mg  once per week  Send me fasting blood sugars of 2 weeks.   Start gabapentin for numbness in feet  Trial taking 4 tablets of metformin ( 2000mg ) ALL together prior to sleep.   Nice to see you!

## 2019-11-18 NOTE — Assessment & Plan Note (Signed)
Trial gabapentin 100mg  tid

## 2019-11-18 NOTE — Assessment & Plan Note (Addendum)
Lab Results  Component Value Date   HGBA1C 8.1 (A) 11/18/2019   Improved. Continue lantus 48 units, metformin 2000mg  qhs ( changed to qhs versus BID to see if mitigates loose stools). Increase ozempic to 0.5mg .Consult with Catie Darnelle Maffucci, pharm D. Close follow up.

## 2019-11-18 NOTE — Assessment & Plan Note (Signed)
Uncontrolled. Continue lipitor 80mg .

## 2019-11-18 NOTE — Assessment & Plan Note (Signed)
Uncontrolled. Start losartan 25mg . Continue amlodipine 10mg .

## 2019-11-22 ENCOUNTER — Other Ambulatory Visit: Payer: Self-pay | Admitting: Family

## 2019-11-23 NOTE — Chronic Care Management (AMB) (Signed)
  Care Management   Note  11/23/2019 Name: Marquis Down. MRN: [id number] DOB: 1970-07-02  Okey Regal Wilmer Santillo. is a 49 y.o. year old male who is a primary care patient of Burnard Hawthorne, FNP. I reached out to [name]. by phone today in response to a referral sent by Mr. Lenore Manner Jr.'s health plan.    Mr. Vittorio was given information about care management services today including:  1. Care management services include personalized support from designated clinical staff supervised by his physician, including individualized plan of care and coordination with other care providers 2. 24/7 contact phone numbers for assistance for urgent and routine care needs. 3. The patient may stop care management services at any time by phone call to the office staff.  Patient agreed to services and verbal consent obtained.   Follow up plan: Telephone appointment with care management team member scheduled for:12/13/2019  Noreene Larsson, [place], [place], [place] [address] Direct Dial: [phone] [email or web address]

## 2019-11-23 NOTE — Progress Notes (Deleted)
11/24/2019 9:17 AM   Ronald Pruitt. [birth year] [id number]  Referring provider: Burnard Hawthorne, FNP [address] [place],  [place] [address]  No chief complaint on file.   HPI: Ronald Pruitt. is a 49 y.o. who is status post ESWL who presents today for follow up.  Underwent ESWL on 09/29/2019 for left renal stone with Dr. Erlene Quan.  Their postprocedural course was as expected and uneventful.  They have passed fragments.  They bring in fragments for analysis.   KUB the left 12 mm calculus appears unchanged    PMH: Past Medical History:  Diagnosis Date  . Arthritis   . BPH (benign prostatic hyperplasia)   . Diabetes ([place])   . Dysuria   . GERD (gastroesophageal reflux disease)   . History of kidney stones   . HTN (hypertension)   . Hyperlipidemia   . Impotence   . Nocturia   . Obesity   . Pneumonia age 67's  . Sleep apnea    CPAP - severe (sleep study 01/11/16)  . Testicular hypofunction   . Wears dentures    full upper and lower    Surgical History: Past Surgical History:  Procedure Laterality Date  . CHOLECYSTECTOMY    . COLONOSCOPY WITH PROPOFOL N/A 10/12/2018   Procedure: COLONOSCOPY WITH PROPOFOL;  Surgeon: Lucilla Lame, MD;  Location: [facility] ENDOSCOPY;  Service: Endoscopy;  Laterality: N/A;  . EXTRACORPOREAL SHOCK WAVE LITHOTRIPSY Left 09/29/2019   Procedure: EXTRACORPOREAL SHOCK WAVE LITHOTRIPSY (ESWL);  Surgeon: Hollice Espy, MD;  Location: [facility] ORS;  Service: Urology;  Laterality: Left;  . NASAL SEPTOPLASTY W/ TURBINOPLASTY N/A 05/20/2017   Procedure: NASAL SEPTOPLASTY WITH SUBMUCOUS RESECTION;  Surgeon: Clyde Canterbury, MD;  Location: [facility] ORS;  Service: ENT;  Laterality: N/A;  . VASECTOMY      Home Medications:  Current Outpatient Medications on File Prior to Visit  Medication Sig Dispense Refill  . amLODipine (NORVASC) 10 MG tablet Take 1 tablet by mouth once daily 90 tablet 0  . atorvastatin (LIPITOR) 80 MG tablet Take 1 tablet (80  mg total) by mouth daily at 6 PM. 90 tablet 1  . Blood Glucose Monitoring Suppl (ONETOUCH VERIO) w/Device KIT 1 Device by Does not apply route daily. 1 kit 0  . folic acid (FOLVITE) 1 MG tablet Take 1 tablet (1 mg total) by mouth daily. 90 tablet 3  . gabapentin (NEURONTIN) 100 MG capsule Take 1 capsule (100 mg total) by mouth 3 (three) times daily. 90 capsule 3  . glucose blood test strip Use to check blood sugar up to 3 times daily 300 each 3  . Insulin Glargine (BASAGLAR KWIKPEN) 100 UNIT/ML INJECT 48 UNITS INTO THE SKIN DAILY. INCREASE AS INSTRUCTED. MAX DAILY DOSE OF 60 UNITS DAILY 45 mL 0  . losartan (COZAAR) 50 MG tablet Take 0.5 tablets (25 mg total) by mouth daily. 90 tablet 3  . metFORMIN (GLUCOPHAGE-XR) 500 MG 24 hr tablet Take 4 tablets (2,000 mg total) by mouth every evening. 360 tablet 1  . omeprazole (PRILOSEC) 20 MG capsule Take 1 capsule by mouth once daily 90 capsule 0  . Semaglutide,0.25 or 0.5MG/DOS, (OZEMPIC, 0.25 OR 0.5 MG/DOSE,) 2 MG/1.5ML SOPN Inject 0.5 mg into the skin once a week. 1.5 mL 3  . tadalafil (CIALIS) 20 MG tablet TAKE 1 TABLET BY MOUTH ONCE DAILY AS NEEDED FOR  ERECTILE  DYSFUNCTION.  EFFECTS  MAY  LAST  UP  TO  36  HOURS 30 tablet 0  .  tadalafil (CIALIS) 5 MG tablet Take 1 tablet (5 mg total) by mouth daily as needed for erectile dysfunction. 30 tablet 11   No current facility-administered medications on file prior to visit.    Allergies: No Known Allergies  Family History: Family History  Problem Relation Age of Onset  . Hyperlipidemia Mother   . Diabetes Mellitus II Mother   . Seizures Mother   . Diabetes Mother   . Mental illness Mother   . Arthritis Mother   . Diabetes Father   . Cirrhosis Father        non alcoholic  . Hyperlipidemia Father   . Arthritis Father   . Liver cancer Father   . Diabetes Maternal Grandmother   . Hyperlipidemia Maternal Grandmother   . Arthritis Maternal Grandmother   . Diabetes Maternal Grandfather   .  Hyperlipidemia Maternal Grandfather   . Arthritis Maternal Grandfather   . Diabetes Paternal Grandmother   . Hyperlipidemia Paternal Grandmother   . Arthritis Paternal Grandmother   . Diabetes Paternal Grandfather   . Hyperlipidemia Paternal Grandfather   . Arthritis Paternal Grandfather   . Kidney disease Neg Hx   . Prostate cancer Neg Hx   . Kidney cancer Neg Hx   . Bladder Cancer Neg Hx   . Colon cancer Neg Hx   . Thyroid cancer Neg Hx     Social History:  reports that he has been smoking cigarettes. He has a 10.00 pack-year smoking history. He has never used smokeless tobacco. He reports that he does not drink alcohol and does not use drugs.  ROS: Pertinent ROS in HPI  Physical Exam: There were no vitals taken for this visit.  Constitutional:  Well nourished. Alert and oriented, No acute distress. HEENT: [place] AT, moist mucus membranes.  Trachea midline Cardiovascular: No clubbing, cyanosis, or edema. Respiratory: Normal respiratory effort, no increased work of breathing. GI: Abdomen is soft, non tender, non distended, no abdominal masses. Liver and spleen not palpable.  No hernias appreciated.  Stool sample for occult testing is not indicated.   GU: No CVA tenderness.  No bladder fullness or masses.  Patient with circumcised/uncircumcised phallus. ***Foreskin easily retracted***  Urethral meatus is patent.  No penile discharge. No penile lesions or rashes. Scrotum without lesions, cysts, rashes and/or edema.  Testicles are located scrotally bilaterally. No masses are appreciated in the testicles. Left and right epididymis are normal. Rectal: Patient with  normal sphincter tone. Anus and perineum without scarring or rashes. No rectal masses are appreciated. Prostate is approximately *** grams, *** nodules are appreciated. Seminal vesicles are normal. Skin: No rashes, bruises or suspicious lesions. Lymph: No inguinal adenopathy. Neurologic: Grossly intact, no focal deficits, moving  all 4 extremities. Psychiatric: Normal mood and affect.  Laboratory Data: Lab Results  Component Value Date   WBC 7.0 08/19/2019   HGB 14.5 08/19/2019   HCT 41.9 08/19/2019   MCV 89.1 08/19/2019   PLT 188.0 08/19/2019    Lab Results  Component Value Date   CREATININE 0.82 08/19/2019       Component Value Date/Time   CHOL 189 04/01/2019 1000   HDL 33.70 (L) 04/01/2019 1000   CHOLHDL 6 04/01/2019 1000   VLDL 52.8 (H) 04/01/2019 1000    Urinalysis    Component Value Date/Time   COLORURINE COLORLESS (A) 03/21/2018 2200   APPEARANCEUR Clear 08/31/2019 0835   LABSPEC 1.029 03/21/2018 2200   PHURINE 7.0 03/21/2018 2200   GLUCOSEU 3+ (A) 08/31/2019 0835   GLUCOSEU >=  1000 (A) 03/11/2017 1206   HGBUR NEGATIVE 03/21/2018 2200   BILIRUBINUR Negative 08/31/2019 0835   KETONESUR NEGATIVE 03/21/2018 2200   PROTEINUR Negative 08/31/2019 0835   PROTEINUR NEGATIVE 03/21/2018 2200   UROBILINOGEN 0.2 03/11/2017 1206   NITRITE Negative 08/31/2019 0835   NITRITE NEGATIVE 03/21/2018 2200   LEUKOCYTESUR Negative 08/31/2019 0835   LEUKOCYTESUR NEGATIVE 03/21/2018 2200    I have reviewed the labs.   Pertinent Imaging: Narrative & Impression  CLINICAL DATA:  Kidney stones. Recent stones. Patient cannot remember which side. Pain in the LOWER back.  EXAM: ABDOMEN - 1 VIEW  COMPARISON:  09/29/2019  FINDINGS: Surgical clips are seen in the RIGHT UPPER QUADRANT of the abdomen. Calcification measuring 1.2 centimeter overlies the region of the UPPER pole of the LEFT kidney and appear stable. Region of the RIGHT kidney is unremarkable. No evidence for ureteral stones.  IMPRESSION: 1. Stable appearance of LEFT intrarenal calculus. 2. No evidence for ureteral stones.   Electronically Signed   By: Nolon Nations M.D.   On: 10/21/2019 15:07    I have independently reviewed the films.  See HPI.  Assessment & Plan:    1. Left renal stone - s/p left ESWL - stone  remains but appears less dense on today's film - he had some fragments and they were send for analysis - RTC in one month for KUB  - he is interested in re treatment if stone does not pass  No follow-ups on file.  These notes generated with voice recognition software. I apologize for typographical errors.  Zara Council, PA-C  [facility] [address]  [place], [place] [address] [phone]

## 2019-11-24 ENCOUNTER — Ambulatory Visit: Payer: Commercial Managed Care - PPO | Admitting: Urology

## 2019-11-25 ENCOUNTER — Other Ambulatory Visit: Payer: Self-pay

## 2019-11-25 ENCOUNTER — Other Ambulatory Visit (INDEPENDENT_AMBULATORY_CARE_PROVIDER_SITE_OTHER): Payer: Commercial Managed Care - PPO

## 2019-11-25 DIAGNOSIS — E1140 Type 2 diabetes mellitus with diabetic neuropathy, unspecified: Secondary | ICD-10-CM

## 2019-11-25 LAB — COMPREHENSIVE METABOLIC PANEL
ALT: 26 U/L (ref 0–53)
AST: 14 U/L (ref 0–37)
Albumin: 4 g/dL (ref 3.5–5.2)
Alkaline Phosphatase: 82 U/L (ref 39–117)
BUN: 11 mg/dL (ref 6–23)
CO2: 25 mEq/L (ref 19–32)
Calcium: 8.7 mg/dL (ref 8.4–10.5)
Chloride: 101 mEq/L (ref 96–112)
Creatinine, Ser: 0.81 mg/dL (ref 0.40–1.50)
GFR: 102.12 mL/min (ref >60.00)
Glucose, Bld: 239 mg/dL — ABNORMAL HIGH (ref 70–99)
Potassium: 3.6 mEq/L (ref 3.5–5.1)
Sodium: 135 mEq/L (ref 135–145)
Total Bilirubin: 0.3 mg/dL (ref 0.2–1.2)
Total Protein: 6.6 g/dL (ref 6.0–8.3)

## 2019-11-25 LAB — LIPID PANEL
Cholesterol: 172 mg/dL (ref 0–200)
HDL: 25.1 mg/dL — ABNORMAL LOW (ref >39.00)
NonHDL: 146.7
Total CHOL/HDL Ratio: 7
Triglycerides: 219 mg/dL — ABNORMAL HIGH (ref 0.0–149.0)
VLDL: 43.8 mg/dL — ABNORMAL HIGH (ref 0.0–40.0)

## 2019-11-25 LAB — MICROALBUMIN / CREATININE URINE RATIO
Creatinine,U: 173.6 mg/dL
Microalb Creat Ratio: 0.5 mg/g (ref 0.0–30.0)
Microalb, Ur: 0.8 mg/dL (ref 0.0–1.9)

## 2019-11-25 LAB — LDL CHOLESTEROL, DIRECT: Direct LDL: 123 mg/dL

## 2019-11-28 ENCOUNTER — Encounter: Payer: Self-pay | Admitting: Family

## 2019-11-30 ENCOUNTER — Other Ambulatory Visit: Payer: Self-pay | Admitting: Family

## 2019-11-30 DIAGNOSIS — E785 Hyperlipidemia, unspecified: Secondary | ICD-10-CM

## 2019-11-30 MED ORDER — EZETIMIBE 10 MG PO TABS: 10.0000 mg | ORAL_TABLET | Freq: Every day | ORAL | 3 refills | Status: DC

## 2019-12-05 ENCOUNTER — Other Ambulatory Visit: Payer: Self-pay

## 2019-12-05 MED ORDER — INSULIN GLARGINE 100 UNITS/ML SOLOSTAR PEN
PEN_INJECTOR | SUBCUTANEOUS | 11 refills | Status: DC
Start: 2019-12-05 — End: 2019-12-05

## 2019-12-05 MED ORDER — INSULIN GLARGINE 100 UNITS/ML SOLOSTAR PEN
PEN_INJECTOR | SUBCUTANEOUS | 11 refills | Status: DC
Start: 2019-12-05 — End: 2019-12-09

## 2019-12-05 MED ORDER — INSULIN GLARGINE 100 UNITS/ML SOLOSTAR PEN: PEN_INJECTOR | SUBCUTANEOUS | 11 refills | Status: DC

## 2019-12-06 ENCOUNTER — Telehealth: Payer: Self-pay | Admitting: Family

## 2019-12-06 DIAGNOSIS — E1140 Type 2 diabetes mellitus with diabetic neuropathy, unspecified: Secondary | ICD-10-CM

## 2019-12-06 NOTE — Telephone Encounter (Signed)
LMTCB

## 2019-12-06 NOTE — Telephone Encounter (Signed)
Also sent patient mychart message, since he is easier to reach this way.

## 2019-12-06 NOTE — Telephone Encounter (Signed)
Ronald Pruitt   Call pt  Ask how is BP is now that he has been on losartan 25mg  for week ? If not < 120/80, we will need to increase losartan to 50mg  and repeat BMP in one week.  Please advise that Im going to place another referral back to catie pharm D as she can be extremely helpful here. I signed lantus order.   Please ask patient if he can speak with insurance and see if another drug in same class - Trulicity, Victoza would be approved?  Catie, how soon can he be seen? We are having trouble getting Ozempic approved?

## 2019-12-06 NOTE — Progress Notes (Signed)
He is scheduled to talk to me on 11/16.   We have Ozempic samples if you would like to give him 1 to tide him over.

## 2019-12-09 ENCOUNTER — Other Ambulatory Visit: Payer: Self-pay

## 2019-12-09 DIAGNOSIS — E1140 Type 2 diabetes mellitus with diabetic neuropathy, unspecified: Secondary | ICD-10-CM

## 2019-12-09 DIAGNOSIS — I10 Essential (primary) hypertension: Secondary | ICD-10-CM

## 2019-12-09 MED ORDER — INSULIN GLARGINE 100 UNITS/ML SOLOSTAR PEN: 48.0000 [IU] | PEN_INJECTOR | Freq: Every day | SUBCUTANEOUS | 11 refills | Status: DC

## 2019-12-09 MED ORDER — INSULIN GLARGINE 100 UNITS/ML SOLOSTAR PEN
PEN_INJECTOR | SUBCUTANEOUS | 11 refills | Status: DC
Start: 2019-12-09 — End: 2019-12-09

## 2019-12-09 MED ORDER — OZEMPIC (0.25 OR 0.5 MG/DOSE) 2 MG/1.5ML ~~LOC~~ SOPN: 0.5000 mg | PEN_INJECTOR | SUBCUTANEOUS | 3 refills | Status: DC

## 2019-12-09 MED ORDER — INSULIN GLARGINE 100 UNIT/ML SOLOSTAR PEN: 48.0000 [IU] | PEN_INJECTOR | Freq: Every day | SUBCUTANEOUS | 11 refills | Status: DC

## 2019-12-13 ENCOUNTER — Telehealth: Payer: Commercial Managed Care - PPO

## 2019-12-13 ENCOUNTER — Telehealth: Payer: Self-pay | Admitting: Pharmacist

## 2019-12-13 NOTE — Progress Notes (Signed)
°  Chronic Care Management   Note  12/13/2019 Name: Ronald Pruitt. MRN: [id number] DOB: 30-Jun-1970   Attempted to contact patient for scheduled appointment for medication management support. Left HIPAA compliant message for patient to return my call at their convenience.    Plan: - If I do not hear back from the patient by end of business today, will collaborate with Care Guide to outreach to schedule follow up with me   Catie Darnelle Maffucci, PharmD, [place], [place] Pharmacist [place] [phone]

## 2019-12-14 ENCOUNTER — Telehealth: Payer: Self-pay

## 2019-12-14 NOTE — Chronic Care Management (AMB) (Signed)
  Care Management   Note  12/14/2019 Name: Ronald Pruitt. MRN: [id number] DOB: 12-Jan-1971  [name]. is a 49 y.o. year old male who is a primary care patient of Burnard Hawthorne, FNP and is actively engaged with the care management team. I reached out to [name]. by phone today to assist with re-scheduling an initial visit with the Pharmacist  Follow up plan: Unsuccessful telephone outreach attempt made. A HIPAA compliant phone message was left for the patient providing contact information and requesting a return call.  The care management team will reach out to the patient again over the next 7 days.  If patient returns call to provider office, please advise to call [facility] at [place], [place], [place], [place] [address] Direct Dial: [phone] [email or web address]: [email or web address]

## 2019-12-16 ENCOUNTER — Other Ambulatory Visit: Payer: Self-pay

## 2019-12-16 DIAGNOSIS — K219 Gastro-esophageal reflux disease without esophagitis: Secondary | ICD-10-CM

## 2019-12-16 DIAGNOSIS — I10 Essential (primary) hypertension: Secondary | ICD-10-CM

## 2019-12-16 DIAGNOSIS — E1140 Type 2 diabetes mellitus with diabetic neuropathy, unspecified: Secondary | ICD-10-CM

## 2019-12-16 MED ORDER — OZEMPIC (0.25 OR 0.5 MG/DOSE) 2 MG/1.5ML ~~LOC~~ SOPN: 0.5000 mg | PEN_INJECTOR | SUBCUTANEOUS | 3 refills | Status: DC

## 2019-12-16 MED ORDER — OMEPRAZOLE 20 MG PO CPDR: 20.0000 mg | DELAYED_RELEASE_CAPSULE | Freq: Every day | ORAL | 3 refills | Status: DC

## 2019-12-16 MED ORDER — LOSARTAN POTASSIUM 50 MG PO TABS: 25.0000 mg | ORAL_TABLET | Freq: Every day | ORAL | 3 refills | Status: DC

## 2019-12-16 MED ORDER — METFORMIN HCL ER 500 MG PO TB24: 2000.0000 mg | ORAL_TABLET | Freq: Every evening | ORAL | 3 refills | Status: DC

## 2019-12-16 MED ORDER — AMLODIPINE BESYLATE 10 MG PO TABS: 10.0000 mg | ORAL_TABLET | Freq: Every day | ORAL | 3 refills | Status: DC

## 2019-12-16 MED ORDER — INSULIN GLARGINE 100 UNIT/ML SOLOSTAR PEN: 48.0000 [IU] | PEN_INJECTOR | Freq: Every day | SUBCUTANEOUS | 3 refills | Status: DC

## 2019-12-19 NOTE — Chronic Care Management (AMB) (Signed)
°  Care Management   Note  12/19/2019 Name: Ronald Pruitt. MRN: [id number] DOB: 28-Dec-1970  Ronald Pruitt. is a 49 y.o. year old male who is a primary care patient of Burnard Hawthorne, FNP and is actively engaged with the care management team. I reached out to [name]. by phone today to assist with re-scheduling an initial visit with the Pharmacist  Follow up plan: Unsuccessful telephone outreach attempt made. A HIPAA compliant phone message was left for the patient providing contact information and requesting a return call.  The care management team will reach out to the patient again over the next 7 days.  If patient returns call to provider office, please advise to call [facility]  at [place], [place], [place], [place] [address] Direct Dial: [phone] [email or web address]: [email or web address]

## 2019-12-20 ENCOUNTER — Other Ambulatory Visit: Payer: Self-pay

## 2019-12-20 ENCOUNTER — Other Ambulatory Visit (INDEPENDENT_AMBULATORY_CARE_PROVIDER_SITE_OTHER): Payer: Commercial Managed Care - PPO

## 2019-12-20 DIAGNOSIS — I10 Essential (primary) hypertension: Secondary | ICD-10-CM | POA: Diagnosis not present

## 2019-12-20 LAB — BASIC METABOLIC PANEL
BUN: 9 mg/dL (ref 6–23)
CO2: 27 mEq/L (ref 19–32)
Calcium: 9.4 mg/dL (ref 8.4–10.5)
Chloride: 98 mEq/L (ref 96–112)
Creatinine, Ser: 0.8 mg/dL (ref 0.40–1.50)
GFR: 102.45 mL/min (ref >60.00)
Glucose, Bld: 243 mg/dL — ABNORMAL HIGH (ref 70–99)
Potassium: 4 mEq/L (ref 3.5–5.1)
Sodium: 136 mEq/L (ref 135–145)

## 2019-12-26 NOTE — Telephone Encounter (Signed)
3rd unsuccessful outreach to r/s missed initial

## 2019-12-26 NOTE — Chronic Care Management (AMB) (Signed)
  Care Management   Note  12/26/2019 Name: Ronald Pruitt. MRN: [id number] DOB: 1970-02-14  Okey Regal Carlo Guevarra. is a 49 y.o. year old male who is a primary care patient of Burnard Hawthorne, FNP and is actively engaged with the care management team. I reached out to [employer]. by phone today to assist with re-scheduling an initial visit with the Pharmacist  Follow up plan: Unable to make contact on outreach attempts x 3. PCP Mable Paris, FNP  notified via routed documentation in medical record.   Noreene Larsson, [place], [place], [place] [address] Direct Dial: [phone] [email or web address] Website: [email or web address]

## 2020-01-24 ENCOUNTER — Other Ambulatory Visit: Payer: Self-pay

## 2020-01-24 ENCOUNTER — Encounter: Payer: Self-pay | Admitting: Family

## 2020-01-24 DIAGNOSIS — N529 Male erectile dysfunction, unspecified: Secondary | ICD-10-CM

## 2020-01-25 ENCOUNTER — Other Ambulatory Visit: Payer: Self-pay

## 2020-01-25 DIAGNOSIS — N529 Male erectile dysfunction, unspecified: Secondary | ICD-10-CM

## 2020-01-25 MED ORDER — TADALAFIL 20 MG PO TABS: ORAL_TABLET | ORAL | 0 refills | Status: DC

## 2020-02-20 ENCOUNTER — Other Ambulatory Visit: Payer: Self-pay

## 2020-02-20 ENCOUNTER — Encounter: Payer: Self-pay | Admitting: Family

## 2020-02-20 ENCOUNTER — Ambulatory Visit: Payer: Commercial Managed Care - PPO | Admitting: Family

## 2020-02-20 VITALS — BP 140/100 | HR 98 | Temp 98.2°F | Ht 72.0 in | Wt 235.0 lb

## 2020-02-20 DIAGNOSIS — E785 Hyperlipidemia, unspecified: Secondary | ICD-10-CM

## 2020-02-20 DIAGNOSIS — E1140 Type 2 diabetes mellitus with diabetic neuropathy, unspecified: Secondary | ICD-10-CM

## 2020-02-20 DIAGNOSIS — Z122 Encounter for screening for malignant neoplasm of respiratory organs: Secondary | ICD-10-CM

## 2020-02-20 DIAGNOSIS — Z794 Long term (current) use of insulin: Secondary | ICD-10-CM

## 2020-02-20 DIAGNOSIS — I10 Essential (primary) hypertension: Secondary | ICD-10-CM

## 2020-02-20 LAB — POCT GLYCOSYLATED HEMOGLOBIN (HGB A1C): Hemoglobin A1C: 7.9 % — AB (ref 4.0–5.6)

## 2020-02-20 MED ORDER — OZEMPIC (1 MG/DOSE) 2 MG/1.5ML ~~LOC~~ SOPN: 1.0000 mg | PEN_INJECTOR | SUBCUTANEOUS | 3 refills | Status: DC

## 2020-02-20 MED ORDER — LOSARTAN POTASSIUM 100 MG PO TABS: 100.0000 mg | ORAL_TABLET | Freq: Every day | ORAL | 1 refills | Status: DC

## 2020-02-20 MED ORDER — INSULIN GLARGINE-YFGN 100 UNIT/ML ~~LOC~~ SOLN: 48.0000 [IU] | Freq: Every day | SUBCUTANEOUS | 3 refills | Status: DC

## 2020-02-20 MED ORDER — EZETIMIBE 10 MG PO TABS: 10.0000 mg | ORAL_TABLET | Freq: Every day | ORAL | 3 refills | Status: DC

## 2020-02-20 NOTE — Addendum Note (Signed)
Addended by: Earlyne Iba on: 02/20/2020 09:23 AM   Modules accepted: Orders

## 2020-02-20 NOTE — Patient Instructions (Addendum)
Increase losartan to 100mg  Increase ozempic to 1mg  once per week Labs in ONE week Lantus has been changed to Semglee 48 units.  Referral for CT lung cancer screen; Let us know if you dont hear back within a week in regards to an appointment being scheduled.

## 2020-02-20 NOTE — Assessment & Plan Note (Signed)
Uncontrolled. Patient never started zetia 10mg  in addition to lipitor 80mg ,I have sent today. If LDL not approaching goal, we may consider PCSK9 inhibitor, and will consult with Catie Darnelle Maffucci, pharmD.

## 2020-02-20 NOTE — Assessment & Plan Note (Signed)
Uncontrolled. Increase losartan to 100mg . Continue amlodipine 10mg . BMP one week.

## 2020-02-20 NOTE — Progress Notes (Signed)
Subjective:    Patient ID: Ronald Pruitt., male    DOB: February 19, 1970, 50 y.o.   MRN: [id number]  CC: Ashley Montminy. is a 50 y.o. male who presents today for follow up.   HPI: Feels well today No complalints  HTN- compliant with losartan 66m, amlodipine 173m No CP, sob. He smoked a cigarette prior to coming in today.   DM- compliant with lantus 48 units,metformin 200019mhs. He had been taking ozempic 0.5mg52mwever as ran out last weke. Hasnt checked blood sugar. No hypoglycemic episodes.  Would like lantus changed to semglee due to [employer] [facility].   HLD-compliant with lipitor 80mg65me has not been taking zetia 10mg 31m for eye exam HISTORY:  Past Medical History:  Diagnosis Date  . Arthritis   . BPH (benign prostatic hyperplasia)   . Diabetes ([facility])  [place]uria   . GERD (gastroesophageal reflux disease)   . History of kidney stones   . HTN (hypertension)   . Hyperlipidemia   . Impotence   . Nocturia   . Obesity   . Pneumonia age 74's  67'seep apnea    CPAP - severe (sleep study 01/11/16)  . Testicular hypofunction   . Wears dentures    full upper and lower   Past Surgical History:  Procedure Laterality Date  . CHOLECYSTECTOMY    . COLONOSCOPY WITH PROPOFOL N/A 10/12/2018   Procedure: COLONOSCOPY WITH PROPOFOL;  Surgeon: Wohl, Lucilla Lame [facility];  Service: Endoscopy;  Laterality: N/A;  . EXTRACORPOREAL SHOCK WAVE LITHOTRIPSY Left 09/29/2019   Procedure: EXTRACORPOREAL SHOCK WAVE LITHOTRIPSY (ESWL);  Surgeon: BrandoHollice Espy Location: [facility] ORS;  Service: Urology;  Laterality: Left;  . NASAL SEPTOPLASTY W/ TURBINOPLASTY N/A 05/20/2017   Procedure: NASAL SEPTOPLASTY WITH SUBMUCOUS RESECTION;  Surgeon: BennetClyde Canterbury Location: [facility] ORS;  Service: ENT;  Laterality: N/A;  . VASECTOMY     Family History  Problem Relation Age of Onset  . Hyperlipidemia Mother   . Diabetes Mellitus II Mother   . Seizures Mother   . Diabetes Mother    . Mental illness Mother   . Arthritis Mother   . Diabetes Father   . Cirrhosis Father        non alcoholic  . Hyperlipidemia Father   . Arthritis Father   . Liver cancer Father   . Diabetes Maternal Grandmother   . Hyperlipidemia Maternal Grandmother   . Arthritis Maternal Grandmother   . Diabetes Maternal Grandfather   . Hyperlipidemia Maternal Grandfather   . Arthritis Maternal Grandfather   . Diabetes Paternal Grandmother   . Hyperlipidemia Paternal Grandmother   . Arthritis Paternal Grandmother   . Diabetes Paternal Grandfather   . Hyperlipidemia Paternal Grandfather   . Arthritis Paternal Grandfather   . Kidney disease Neg Hx   . Prostate cancer Neg Hx   . Kidney cancer Neg Hx   . Bladder Cancer Neg Hx   . Colon cancer Neg Hx   . Thyroid cancer Neg Hx     Allergies: Patient has no known allergies. Current Outpatient Medications on File Prior to Visit  Medication Sig Dispense Refill  . amLODipine (NORVASC) 10 MG tablet Take 1 tablet (10 mg total) by mouth daily. 90 tablet 3  . atorvastatin (LIPITOR) 80 MG tablet Take 1 tablet (80 mg total) by mouth daily at 6 PM. 90 tablet 1  . Blood Glucose Monitoring Suppl (ONETOUCH VERIO) w/Device KIT 1 Device  by Does not apply route daily. 1 kit 0  . folic acid (FOLVITE) 1 MG tablet Take 1 tablet (1 mg total) by mouth daily. 90 tablet 3  . glucose blood test strip Use to check blood sugar up to 3 times daily 300 each 3  . metFORMIN (GLUCOPHAGE-XR) 500 MG 24 hr tablet Take 4 tablets (2,000 mg total) by mouth every evening. 360 tablet 3  . omeprazole (PRILOSEC) 20 MG capsule Take 1 capsule (20 mg total) by mouth daily. 90 capsule 3  . tadalafil (CIALIS) 20 MG tablet TAKE 1 TABLET BY MOUTH ONCE DAILY AS NEEDED FOR  ERECTILE  DYSFUNCTION.  EFFECTS  MAY  LAST  UP  TO  36  HOURS 30 tablet 0  . tadalafil (CIALIS) 5 MG tablet Take 1 tablet (5 mg total) by mouth daily as needed for erectile dysfunction. 30 tablet 11  . gabapentin (NEURONTIN)  100 MG capsule Take 1 capsule (100 mg total) by mouth 3 (three) times daily. (Patient not taking: Reported on 02/20/2020) 90 capsule 3   No current facility-administered medications on file prior to visit.    Social History   Tobacco Use  . Smoking status: Current Every Day Smoker    Packs/day: 0.50    Years: 20.00    Pack years: 10.00    Types: Cigarettes  . Smokeless tobacco: Never Used  Vaping Use  . Vaping Use: Former  Substance Use Topics  . Alcohol use: No    Alcohol/week: 0.0 standard drinks  . Drug use: Never    Review of Systems  Constitutional: Negative for chills and fever.  Respiratory: Negative for cough.   Cardiovascular: Negative for chest pain and palpitations.  Gastrointestinal: Negative for nausea and vomiting.      Objective:    BP (!) 140/100   Pulse 98   Temp 98.2 F (36.8 C) (Oral)   Ht 6' (1.829 m)   Wt 235 lb (106.6 kg)   SpO2 97%   BMI 31.87 kg/m  BP Readings from Last 3 Encounters:  02/20/20 (!) 140/100  11/18/19 (!) 152/110  10/21/19 (!) 135/92   Wt Readings from Last 3 Encounters:  02/20/20 235 lb (106.6 kg)  11/18/19 231 lb 12.8 oz (105.1 kg)  10/21/19 235 lb (106.6 kg)    Physical Exam Vitals reviewed.  Constitutional:      Appearance: He is well-developed and well-nourished.  Cardiovascular:     Rate and Rhythm: Regular rhythm.     Heart sounds: Normal heart sounds.  Pulmonary:     Effort: Pulmonary effort is normal. No respiratory distress.     Breath sounds: Normal breath sounds. No wheezing, rhonchi or rales.  Skin:    General: Skin is warm and dry.  Neurological:     Mental Status: He is alert.  Psychiatric:        Mood and Affect: Mood and affect normal.        Speech: Speech normal.        Behavior: Behavior normal.        Assessment & Plan:   Problem List Items Addressed This Visit      Cardiovascular and Mediastinum   HTN (hypertension) - Primary    Uncontrolled. Increase losartan to 133m. Continue  amlodipine 121m BMP one week.      Relevant Medications   ezetimibe (ZETIA) 10 MG tablet   losartan (COZAAR) 100 MG tablet   Other Relevant Orders   Basic metabolic panel     Endocrine  DM (diabetes mellitus) (HCC)   Relevant Medications   losartan (COZAAR) 100 MG tablet   Insulin Glargine-yfgn (SEMGLEE, YFGN,) 100 UNIT/ML SOLN   Semaglutide, 1 MG/DOSE, (OZEMPIC, 1 MG/DOSE,) 2 MG/1.5ML SOPN   Type 2 diabetes mellitus with diabetic neuropathy, unspecified ([place])    . Lab Results  Component Value Date   HGBA1C 8.1 (A) 11/18/2019   Today a1c is 7.9. Uncontrolled. Increase ozempic to 66m. Continue lantus 48 units ( changed to equivalent Semglee) ,metformin 20064mqhs      Relevant Medications   losartan (COZAAR) 100 MG tablet   Insulin Glargine-yfgn (SEMGLEE, YFGN,) 100 UNIT/ML SOLN   Semaglutide, 1 MG/DOSE, (OZEMPIC, 1 MG/DOSE,) 2 MG/1.5ML SOPN     Other   HLD (hyperlipidemia)    Uncontrolled. Patient never started zetia 1072mn addition to lipitor 68m61mhave sent today. If LDL not approaching goal, we may consider PCSK9 inhibitor, and will consult with Catie TravDarnelle MaffucciarmD.      Relevant Medications   ezetimibe (ZETIA) 10 MG tablet   losartan (COZAAR) 100 MG tablet    Other Visit Diagnoses    Screening for lung cancer       Relevant Orders   CT CHEST LUNG CA SCREEN LOW DOSE W/O CM       I have discontinued RobeOkey Regalewart Jr.'s Ozempic (0.25 or 0.5 MG/DOSE) and insulin glargine. I have also changed his losartan. Additionally, I am having him start on Insulin Glargine-yfgn and Ozempic (1 MG/DOSE). Lastly, I am having him maintain his OneTouch Verio, glucose blood, folic acid, atorvastatin, tadalafil, gabapentin, omeprazole, metFORMIN, amLODipine, tadalafil, and ezetimibe.   Meds ordered this encounter  Medications  . ezetimibe (ZETIA) 10 MG tablet    Sig: Take 1 tablet (10 mg total) by mouth daily.    Dispense:  90 tablet    Refill:  3    Order Specific  Question:   Supervising Provider    Answer:   TULLDeborra [id number]]  . losartan (COZAAR) 100 MG tablet    Sig: Take 1 tablet (100 mg total) by mouth daily.    Dispense:  90 tablet    Refill:  1    Order Specific Question:   Supervising Provider    Answer:   TULLDeborra [id number]]  . Insulin Glargine-yfgn (SEMGLEE, YFGN,) 100 UNIT/ML SOLN    Sig: Inject 48 Units into the skin daily.    Dispense:  10 mL    Refill:  3    Order Specific Question:   Supervising Provider    Answer:   TULLDeborra [id number]]  . Semaglutide, 1 MG/DOSE, (OZEMPIC, 1 MG/DOSE,) 2 MG/1.5ML SOPN    Sig: Inject 1 mg into the skin once a week.    Dispense:  1.5 mL    Refill:  3    Order Specific Question:   Supervising Provider    Answer:   TULLCrecencio Mc95]    Return precautions given.   Risks, benefits, and alternatives of the medications and treatment plan prescribed today were discussed, and patient expressed understanding.   Education regarding symptom management and diagnosis given to patient on AVS.  Continue to follow with ArneBurnard HawthorneP for routine health maintenance.   RobeBenjie Karvonend I agreed with plan.   MargMable ParisP

## 2020-02-20 NOTE — Assessment & Plan Note (Addendum)
.   Lab Results  Component Value Date   HGBA1C 8.1 (A) 11/18/2019   Today a1c is 7.9. Uncontrolled. Increase ozempic to 1mg . Continue lantus 48 units ( changed to equivalent Semglee) ,metformin 2000mg  qhs

## 2020-02-24 ENCOUNTER — Telehealth: Payer: Self-pay | Admitting: *Deleted

## 2020-02-24 NOTE — Telephone Encounter (Signed)
Received referral for low dose lung cancer screening CT scan. Message left at phone number listed in EMR for patient to call me back to facilitate scheduling scan.  

## 2020-02-27 ENCOUNTER — Other Ambulatory Visit (INDEPENDENT_AMBULATORY_CARE_PROVIDER_SITE_OTHER): Payer: Commercial Managed Care - PPO

## 2020-02-27 ENCOUNTER — Other Ambulatory Visit: Payer: Self-pay

## 2020-02-27 DIAGNOSIS — I10 Essential (primary) hypertension: Secondary | ICD-10-CM | POA: Diagnosis not present

## 2020-02-28 LAB — BASIC METABOLIC PANEL
BUN: 9 mg/dL (ref 6–23)
CO2: 27 mEq/L (ref 19–32)
Calcium: 9.8 mg/dL (ref 8.4–10.5)
Chloride: 100 mEq/L (ref 96–112)
Creatinine, Ser: 0.97 mg/dL (ref 0.40–1.50)
GFR: 90.25 mL/min (ref >60.00)
Glucose, Bld: 290 mg/dL — ABNORMAL HIGH (ref 70–99)
Potassium: 4.3 mEq/L (ref 3.5–5.1)
Sodium: 134 mEq/L — ABNORMAL LOW (ref 135–145)

## 2020-02-29 ENCOUNTER — Encounter: Payer: Self-pay | Admitting: Family

## 2020-04-12 ENCOUNTER — Encounter: Payer: Self-pay | Admitting: *Deleted

## 2020-05-07 ENCOUNTER — Encounter: Payer: Self-pay | Admitting: Family

## 2020-05-07 ENCOUNTER — Other Ambulatory Visit: Payer: Self-pay | Admitting: Family

## 2020-05-07 DIAGNOSIS — N529 Male erectile dysfunction, unspecified: Secondary | ICD-10-CM

## 2020-05-10 ENCOUNTER — Encounter: Payer: Self-pay | Admitting: *Deleted

## 2020-05-16 ENCOUNTER — Encounter: Payer: Self-pay | Admitting: *Deleted

## 2020-05-21 ENCOUNTER — Ambulatory Visit (INDEPENDENT_AMBULATORY_CARE_PROVIDER_SITE_OTHER): Payer: Commercial Managed Care - PPO | Admitting: Family

## 2020-05-21 ENCOUNTER — Other Ambulatory Visit: Payer: Self-pay

## 2020-05-21 ENCOUNTER — Telehealth: Payer: Self-pay | Admitting: Family

## 2020-05-21 ENCOUNTER — Encounter: Payer: Self-pay | Admitting: Family

## 2020-05-21 VITALS — BP 142/100 | HR 92 | Temp 97.6°F | Ht 72.0 in | Wt 237.4 lb

## 2020-05-21 DIAGNOSIS — G4733 Obstructive sleep apnea (adult) (pediatric): Secondary | ICD-10-CM

## 2020-05-21 DIAGNOSIS — E1140 Type 2 diabetes mellitus with diabetic neuropathy, unspecified: Secondary | ICD-10-CM

## 2020-05-21 DIAGNOSIS — I10 Essential (primary) hypertension: Secondary | ICD-10-CM | POA: Diagnosis not present

## 2020-05-21 DIAGNOSIS — E785 Hyperlipidemia, unspecified: Secondary | ICD-10-CM

## 2020-05-21 LAB — POCT GLYCOSYLATED HEMOGLOBIN (HGB A1C): Hemoglobin A1C: 7.7 % — AB (ref 4.0–5.6)

## 2020-05-21 MED ORDER — EZETIMIBE 10 MG PO TABS: 10.0000 mg | ORAL_TABLET | Freq: Every day | ORAL | 3 refills | Status: DC

## 2020-05-21 MED ORDER — HYDROCHLOROTHIAZIDE 12.5 MG PO CAPS: 12.5000 mg | ORAL_CAPSULE | Freq: Every day | ORAL | 0 refills | Status: DC

## 2020-05-21 MED ORDER — GABAPENTIN 100 MG PO CAPS: 100.0000 mg | ORAL_CAPSULE | Freq: Three times a day (TID) | ORAL | 3 refills | Status: DC

## 2020-05-21 MED ORDER — OZEMPIC (1 MG/DOSE) 2 MG/1.5ML ~~LOC~~ SOPN: 1.0000 mg | PEN_INJECTOR | SUBCUTANEOUS | 3 refills | Status: DC

## 2020-05-21 NOTE — Assessment & Plan Note (Signed)
Pending re-evaluation for better compliance.

## 2020-05-21 NOTE — Assessment & Plan Note (Signed)
Uncontrolled. Continue losartan 100mg , amlodipine 10mg . Start hctz 12.5mg  with labs to be scheduled one week after he receives through mail order.

## 2020-05-21 NOTE — Patient Instructions (Addendum)
Call to make an appointment for Annual Lung cancer screen  With CT Chest: [phone].  Let me know if any issues in doing so.  Increase ozempic to 1mg  once per week  Start HCTZ 12.5mg  and schedule labs for ONE week after starting. Goal of blood pressure is less than 120/80   Referral to pulmonology  Let us know if you dont hear back within a week in regards to an appointment being scheduled.

## 2020-05-21 NOTE — Telephone Encounter (Signed)
LMTCB

## 2020-05-21 NOTE — Telephone Encounter (Signed)
Call pt I meant to advise him to Decrease semglee to 38 units since we have increased ozempic to 1mg .   I want to avoid hypoglycemia

## 2020-05-21 NOTE — Assessment & Plan Note (Signed)
Lab Results  Component Value Date   HGBA1C 7.7 (A) 05/21/2020   Uncontrolled. Ozempic was not increased as advised at last visit. Advised to increase to 1mg  and new rx sent. Decrease semglee to 38 units, continue metformin 2000mg  qhs.

## 2020-05-21 NOTE — Assessment & Plan Note (Signed)
Anticipate uncontrolled as he has not been on zetia 10mg , this has been refilled today. Continue lipitor 80mg 

## 2020-05-21 NOTE — Progress Notes (Signed)
Subjective:    Patient ID: Ronald Pruitt., male    DOB: 11-Feb-1970, 50 y.o.   MRN: [id number]  CC: Ronald Pruitt. is a 50 y.o. male who presents today for follow up.   HPI: Feels well today.  No new complaints.   HLD- compliant lipitor 74m. He was not taking zetia 176mas wasn't received by mail order pharmacy.   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   OSA- he is wearing cipap however he instinctively pulls it off so he doesn't wear throughout the night.  Has seen Dr BennRichardson Landry2019.   DM- compliant with ozempic 0.5mg,24m never increased to 1mg d26mto insurance. He changed to semglee 48 units last week. Compliant with metformin 2000mg q71m   He never did CT lung cancer screen  HISTORY:  Past Medical History:  Diagnosis Date  . Arthritis   . BPH (benign prostatic hyperplasia)   . Diabetes (HCC)   Sedaliaysuria   . GERD (gastroesophageal reflux disease)   . History of kidney stones   . HTN (hypertension)   . Hyperlipidemia   . Impotence   . Nocturia   . Obesity   . Pneumonia age 20's  .50'sep apnea    CPAP - severe (sleep study 01/11/16)  . Testicular hypofunction   . Wears dentures    full upper and lower   Past Surgical History:  Procedure Laterality Date  . CHOLECYSTECTOMY    . COLONOSCOPY WITH PROPOFOL N/A 10/12/2018   Procedure: COLONOSCOPY WITH PROPOFOL;  Surgeon: Wohl, DLucilla Lame[facility];  Service: Endoscopy;  Laterality: N/A;  . EXTRACORPOREAL SHOCK WAVE LITHOTRIPSY Left 09/29/2019   Procedure: EXTRACORPOREAL SHOCK WAVE LITHOTRIPSY (ESWL);  Surgeon: BrandonHollice EspyLocation: [facility];  Service: Urology;  Laterality: Left;  . NASAL SEPTOPLASTY W/ TURBINOPLASTY N/A 05/20/2017   Procedure: NASAL SEPTOPLASTY WITH SUBMUCOUS RESECTION;  Surgeon: BennettClyde CanterburyLocation: [facility];  Service: ENT;   Laterality: N/A;  . VASECTOMY     Family History  Problem Relation Age of Onset  . Hyperlipidemia Mother   . Diabetes Mellitus II Mother   . Seizures Mother   . Diabetes Mother   . Mental illness Mother   . Arthritis Mother   . Diabetes Father   . Cirrhosis Father        non alcoholic  . Hyperlipidemia Father   . Arthritis Father   . Liver cancer Father   . Diabetes Maternal Grandmother   . Hyperlipidemia Maternal Grandmother   . Arthritis Maternal Grandmother   . Diabetes Maternal Grandfather   . Hyperlipidemia Maternal Grandfather   . Arthritis Maternal Grandfather   . Diabetes Paternal Grandmother   . Hyperlipidemia Paternal Grandmother   . Arthritis Paternal Grandmother   . Diabetes Paternal Grandfather   . Hyperlipidemia Paternal Grandfather   . Arthritis Paternal Grandfather   . Kidney disease Neg Hx   . Prostate cancer Neg Hx   . Kidney cancer Neg Hx   . Bladder Cancer Neg Hx   . Colon cancer Neg Hx   . Thyroid cancer Neg Hx     Allergies: Patient has no known allergies. Current Outpatient Medications on File Prior to Visit  Medication Sig Dispense Refill  . amLODipine (NORVASC) 10 MG tablet Take 1 tablet (10 mg total)  by mouth daily. 90 tablet 3  . atorvastatin (LIPITOR) 80 MG tablet Take 1 tablet (80 mg total) by mouth daily at 6 PM. 90 tablet 1  . Blood Glucose Monitoring Suppl (ONETOUCH VERIO) w/Device KIT 1 Device by Does not apply route daily. 1 kit 0  . folic acid (FOLVITE) 1 MG tablet Take 1 tablet (1 mg total) by mouth daily. 90 tablet 3  . glucose blood test strip Use to check blood sugar up to 3 times daily 300 each 3  . Insulin Glargine-yfgn (SEMGLEE, YFGN,) 100 UNIT/ML SOLN Inject 48 Units into the skin daily. 10 mL 3  . losartan (COZAAR) 100 MG tablet Take 1 tablet (100 mg total) by mouth daily. 90 tablet 1  . metFORMIN (GLUCOPHAGE-XR) 500 MG 24 hr tablet Take 4 tablets (2,000 mg total) by mouth every evening. 360 tablet 3  . omeprazole (PRILOSEC)  20 MG capsule Take 1 capsule (20 mg total) by mouth daily. 90 capsule 3  . tadalafil (CIALIS) 20 MG tablet TAKE 1 TABLET BY MOUTH ONCE DAILY AS NEEDED FOR  ERECTILE  DYSFUNCTION  -  EFFECTS  MAY  LAST  UP  TO  36  HRS 30 tablet 0  . tadalafil (CIALIS) 5 MG tablet Take 1 tablet (5 mg total) by mouth daily as needed for erectile dysfunction. 30 tablet 11   No current facility-administered medications on file prior to visit.    Social History   Tobacco Use  . Smoking status: Current Every Day Smoker    Packs/day: 0.50    Years: 20.00    Pack years: 10.00    Types: Cigarettes  . Smokeless tobacco: Never Used  Vaping Use  . Vaping Use: Former  Substance Use Topics  . Alcohol use: No    Alcohol/week: 0.0 standard drinks  . Drug use: Never    Review of Systems  Constitutional: Negative for chills and fever.  Respiratory: Negative for cough.   Cardiovascular: Negative for chest pain and palpitations.  Gastrointestinal: Negative for nausea and vomiting.  Neurological: Positive for numbness (bilateral feet).      Objective:    BP (!) 142/100   Pulse 92   Temp 97.6 F (36.4 C)   Ht 6' (1.829 m)   Wt 237 lb 6.4 oz (107.7 kg)   SpO2 99%   BMI 32.20 kg/m  BP Readings from Last 3 Encounters:  05/21/20 (!) 142/100  02/20/20 (!) 140/100  11/18/19 (!) 152/110   Wt Readings from Last 3 Encounters:  05/21/20 237 lb 6.4 oz (107.7 kg)  02/20/20 235 lb (106.6 kg)  11/18/19 231 lb 12.8 oz (105.1 kg)    Physical Exam Vitals reviewed.  Constitutional:      Appearance: He is well-developed.  Cardiovascular:     Rate and Rhythm: Regular rhythm.     Heart sounds: Normal heart sounds.  Pulmonary:     Effort: Pulmonary effort is normal. No respiratory distress.     Breath sounds: Normal breath sounds. No wheezing, rhonchi or rales.  Skin:    General: Skin is warm and dry.  Neurological:     Mental Status: He is alert.  Psychiatric:        Speech: Speech normal.        Behavior:  Behavior normal.        Assessment & Plan:   Problem List Items Addressed This Visit      Cardiovascular and Mediastinum   HTN (hypertension)    Uncontrolled. Continue losartan  12m, amlodipine 124m Start hctz 12.18m74mith labs to be scheduled one week after he receives through mail order.       Relevant Medications   hydrochlorothiazide (MICROZIDE) 12.5 MG capsule   Other Relevant Orders   Comprehensive metabolic panel     Respiratory   OSA (obstructive sleep apnea)    Pending re-evaluation for better compliance.       Relevant Orders   Ambulatory referral to Pulmonology     Endocrine   [facility]diabetes mellitus) ([facility]    Lab Results  Component Value Date   HGBA1C 7.7 (A) 05/21/2020   Uncontrolled. Ozempic was not increased as advised at last visit. Advised to increase to 1mg89md new rx sent. Decrease semglee to 38 units, continue metformin 2000mg59m.       Relevant Medications   Semaglutide, 1 MG/DOSE, (OZEMPIC, 1 MG/DOSE,) 2 MG/1.5ML SOPN   Other Relevant Orders   POCT HgB A1C (Completed)   Lipid panel     Other   HLD (hyperlipidemia)    Anticipate uncontrolled as he has not been on zetia 10mg,57ms has been refilled today. Continue lipitor 80mg  718mRelevant Medications   hydrochlorothiazide (MICROZIDE) 12.5 MG capsule     Advised to call and schedule CT lung cancer screen.    I am having Ronald Pruitt on hydrochlorothiazide. I am also having him maintain his OneTouch Verio, glucose blood, folic acid, atorvastatin, tadalafil, omeprazole, metFORMIN, amLODipine, losartan, Insulin Glargine-yfgn, tadalafil, and Ozempic (1 MG/DOSE).   Meds ordered this encounter  Medications  . hydrochlorothiazide (MICROZIDE) 12.5 MG capsule    Sig: Take 1 capsule (12.5 mg total) by mouth daily.    Dispense:  90 capsule    Refill:  0    Order Specific Question:   Supervising Provider    Answer:   TULLO, Deborra Medina5]  . Semaglutide, 1 MG/DOSE,  (OZEMPIC, 1 MG/DOSE,) 2 MG/1.5ML SOPN    Sig: Inject 1 mg into the skin once a week.    Dispense:  3 mL    Refill:  3    Order Specific Question:   Supervising Provider    Answer:   TULLO, Crecencio Mc    Return precautions given.   Risks, benefits, and alternatives of the medications and treatment plan prescribed today were discussed, and patient expressed understanding.   Education regarding symptom management and diagnosis given to patient on AVS.  Continue to follow with Fina Heizer,Burnard Hawthorneor routine health maintenance.   Kelvin Ronald Pruitt agreed with plan.   MargareMable Paris

## 2020-05-22 NOTE — Telephone Encounter (Signed)
LMTCB

## 2020-06-08 ENCOUNTER — Telehealth: Payer: Self-pay | Admitting: Family

## 2020-06-08 NOTE — Telephone Encounter (Signed)
Mail letter  Mr Fleischer,   I ordered CT chest to screen for lung cancer. The Cancer center who leads this program has tried to reach you to schedule.  Please call to make an appointment for Annual Lung cancer screen   [phone]   Catalina Antigua

## 2020-06-08 NOTE — Telephone Encounter (Signed)
Margaret ordered pt to have a CT pt has been reached out to and has not responded. Pt has appt in June can you please give pt [place] so he can schedule.   See below. Thank you!  ---- Message ----- From: Lieutenant Diego, RN Sent: 06/05/2020  10:55 AM EDT To: Ashley Jacobs Subject: RE: CT                                         That's great, it is [phone] or my email is [email or web address].  Please feel free to give that to any patients. thanks

## 2020-06-08 NOTE — Telephone Encounter (Signed)
Latter mailed to patient.

## 2020-06-21 ENCOUNTER — Telehealth: Payer: Self-pay | Admitting: *Deleted

## 2020-06-21 NOTE — Telephone Encounter (Signed)
Received referral for low dose lung cancer screening CT scan. Message left at phone number listed in EMR for patient to call me back to facilitate scheduling scan.  

## 2020-07-02 ENCOUNTER — Encounter: Payer: Self-pay | Admitting: Family

## 2020-07-02 ENCOUNTER — Ambulatory Visit: Payer: Commercial Managed Care - PPO | Admitting: Family

## 2020-07-02 ENCOUNTER — Other Ambulatory Visit: Payer: Self-pay

## 2020-07-02 DIAGNOSIS — N529 Male erectile dysfunction, unspecified: Secondary | ICD-10-CM | POA: Diagnosis not present

## 2020-07-02 DIAGNOSIS — Z794 Long term (current) use of insulin: Secondary | ICD-10-CM

## 2020-07-02 DIAGNOSIS — I10 Essential (primary) hypertension: Secondary | ICD-10-CM | POA: Diagnosis not present

## 2020-07-02 DIAGNOSIS — E785 Hyperlipidemia, unspecified: Secondary | ICD-10-CM

## 2020-07-02 DIAGNOSIS — E1140 Type 2 diabetes mellitus with diabetic neuropathy, unspecified: Secondary | ICD-10-CM

## 2020-07-02 MED ORDER — TADALAFIL 20 MG PO TABS: ORAL_TABLET | ORAL | 0 refills | Status: DC

## 2020-07-02 NOTE — Progress Notes (Signed)
Subjective:    Patient ID: Ronald Pruitt., male    DOB: 1970/06/10, 50 y.o.   MRN: [id number]  CC: Ronald Pruitt. is a 50 y.o. male who presents today for follow up.   HPI: Feels well today No new complaints  He would like refill of cialis and plans to make follow up with urology for ongoing refills.   He is not fasting today.  Plans to call pulmonology to schedule an appointment as they left a vm.  HTN- Compliant with losartan $RemoveBefor'100mg'RqfMuAtDkiAd$ , amlodipine $RemoveBefo'10mg'LjRtulUnnFU$ . He is not sure if he on hctz 12.$RemoveBe'5mg'bFhFGqzgT$  . No cp, sob.   DM- Compliant with ozempic $RemoveBefo'1mg'TZLbnoYbHbN$  ,semglee 38 units, metformin $RemoveBeforeDE'2000mg'OyKWBqurhJgtiKD$  qhs. Hasnt checked blood sugar. He feels blood sugar has improved as polydipsia has improved. No vision changes.   No hypoglycemic episodes HLD- compliant with zetia $RemoveBe'10mg'qHrlYeSIV$ , lipitor $RemoveB'80mg'xNUtfMfX$      HISTORY:  Past Medical History:  Diagnosis Date  . Arthritis   . BPH (benign prostatic hyperplasia)   . Diabetes ([place])   . Dysuria   . GERD (gastroesophageal reflux disease)   . History of kidney stones   . HTN (hypertension)   . Hyperlipidemia   . Impotence   . Nocturia   . Obesity   . Pneumonia age 50's  . Sleep apnea    CPAP - severe (sleep study 01/11/16)  . Testicular hypofunction   . Wears dentures    full upper and lower   Past Surgical History:  Procedure Laterality Date  . CHOLECYSTECTOMY    . COLONOSCOPY WITH PROPOFOL N/A 10/12/2018   Procedure: COLONOSCOPY WITH PROPOFOL;  Surgeon: Lucilla Lame, MD;  [facility];  Service: Endoscopy;  Laterality: N/A;  . EXTRACORPOREAL SHOCK WAVE LITHOTRIPSY Left 09/29/2019   Procedure: EXTRACORPOREAL SHOCK WAVE LITHOTRIPSY (ESWL);  Surgeon: Hollice Espy, MD;  Location: [facility];  Service: Urology;  Laterality: Left;  . NASAL SEPTOPLASTY W/ TURBINOPLASTY N/A 05/20/2017   Procedure: NASAL SEPTOPLASTY WITH SUBMUCOUS RESECTION;  Surgeon: Clyde Canterbury, MD;  Location: [facility];  Service: ENT;  Laterality: N/A;  . VASECTOMY     Family History   Problem Relation Age of Onset  . Hyperlipidemia Mother   . Diabetes Mellitus II Mother   . Seizures Mother   . Diabetes Mother   . Mental illness Mother   . Arthritis Mother   . Diabetes Father   . Cirrhosis Father        non alcoholic  . Hyperlipidemia Father   . Arthritis Father   . Liver cancer Father   . Diabetes Maternal Grandmother   . Hyperlipidemia Maternal Grandmother   . Arthritis Maternal Grandmother   . Diabetes Maternal Grandfather   . Hyperlipidemia Maternal Grandfather   . Arthritis Maternal Grandfather   . Diabetes Paternal Grandmother   . Hyperlipidemia Paternal Grandmother   . Arthritis Paternal Grandmother   . Diabetes Paternal Grandfather   . Hyperlipidemia Paternal Grandfather   . Arthritis Paternal Grandfather   . Kidney disease Neg Hx   . Prostate cancer Neg Hx   . Kidney cancer Neg Hx   . Bladder Cancer Neg Hx   . Colon cancer Neg Hx   . Thyroid cancer Neg Hx     Allergies: Patient has no known allergies. Current Outpatient Medications on File Prior to Visit  Medication Sig Dispense Refill  . amLODipine (NORVASC) 10 MG tablet Take 1 tablet (10 mg total) by mouth daily. 90 tablet 3  . atorvastatin (LIPITOR) 80 MG  tablet Take 1 tablet (80 mg total) by mouth daily at 6 PM. 90 tablet 1  . Blood Glucose Monitoring Suppl (ONETOUCH VERIO) w/Device KIT 1 Device by Does not apply route daily. 1 kit 0  . ezetimibe (ZETIA) 10 MG tablet Take 1 tablet (10 mg total) by mouth daily. 90 tablet 3  . folic acid (FOLVITE) 1 MG tablet Take 1 tablet (1 mg total) by mouth daily. 90 tablet 3  . gabapentin (NEURONTIN) 100 MG capsule Take 1 capsule (100 mg total) by mouth 3 (three) times daily. 90 capsule 3  . glucose blood test strip Use to check blood sugar up to 3 times daily 300 each 3  . hydrochlorothiazide (MICROZIDE) 12.5 MG capsule Take 1 capsule (12.5 mg total) by mouth daily. 90 capsule 0  . Insulin Glargine-yfgn (SEMGLEE, YFGN,) 100 UNIT/ML SOLN Inject 48 Units  into the skin daily. 10 mL 3  . losartan (COZAAR) 100 MG tablet Take 1 tablet (100 mg total) by mouth daily. 90 tablet 1  . metFORMIN (GLUCOPHAGE-XR) 500 MG 24 hr tablet Take 4 tablets (2,000 mg total) by mouth every evening. 360 tablet 3  . omeprazole (PRILOSEC) 20 MG capsule Take 1 capsule (20 mg total) by mouth daily. 90 capsule 3  . Semaglutide, 1 MG/DOSE, (OZEMPIC, 1 MG/DOSE,) 2 MG/1.5ML SOPN Inject 1 mg into the skin once a week. 3 mL 3  . tadalafil (CIALIS) 5 MG tablet Take 1 tablet (5 mg total) by mouth daily as needed for erectile dysfunction. 30 tablet 11   No current facility-administered medications on file prior to visit.    Social History   Tobacco Use  . Smoking status: Current Every Day Smoker    Packs/day: 0.50    Years: 20.00    Pack years: 10.00    Types: Cigarettes  . Smokeless tobacco: Never Used  Vaping Use  . Vaping Use: Former  Substance Use Topics  . Alcohol use: No    Alcohol/week: 0.0 standard drinks  . Drug use: Never    Review of Systems  Constitutional: Negative for chills and fever.  Eyes: Negative for visual disturbance.  Respiratory: Negative for cough.   Cardiovascular: Negative for chest pain and palpitations.  Gastrointestinal: Negative for nausea and vomiting.  Endocrine: Negative for polydipsia.      Objective:    BP (!) 138/92 (BP Location: Left Arm, Patient Position: Sitting, Cuff Size: Large)   Pulse (!) 101   Temp 98 F (36.7 C) (Oral)   Ht 6' (1.829 m)   Wt 233 lb (105.7 kg)   SpO2 99%   BMI 31.60 kg/m  BP Readings from Last 3 Encounters:  07/02/20 (!) 138/92  05/21/20 (!) 142/100  02/20/20 (!) 140/100   Wt Readings from Last 3 Encounters:  07/02/20 233 lb (105.7 kg)  05/21/20 237 lb 6.4 oz (107.7 kg)  02/20/20 235 lb (106.6 kg)    Physical Exam Vitals reviewed.  Constitutional:      Appearance: He is well-developed.  Cardiovascular:     Rate and Rhythm: Regular rhythm.     Heart sounds: Normal heart sounds.   Pulmonary:     Effort: Pulmonary effort is normal. No respiratory distress.     Breath sounds: Normal breath sounds. No wheezing, rhonchi or rales.  Skin:    General: Skin is warm and dry.  Neurological:     Mental Status: He is alert.  Psychiatric:        Speech: Speech normal.  Behavior: Behavior normal.        Assessment & Plan:   Problem List Items Addressed This Visit      Cardiovascular and Mediastinum   HTN (hypertension)    Slightly elevated. Continue  losartan 178m, amlodipine 140mand patient will confirm that he is on hctz 12.25m225mnce he gets home.       Relevant Medications   tadalafil (CIALIS) 20 MG tablet     Endocrine   Type 2 diabetes mellitus with diabetic neuropathy, unspecified ([facility]  Anticipate improved however patient hasnt checked blood glucose. Reiterated the importance of checking glucose while on insulin for safety and to titrate medication. Continue ozempic 1mg61memglee 38 units, metformin 2000mg7m. He will call with fasting blood glucose.         Other   Erectile dysfunction of organic origin   Relevant Medications   tadalafil (CIALIS) 20 MG tablet   HLD (hyperlipidemia)    Overdue for lipid panel. Continue zetia 10mg,725mitor 80mg. 23md panel ordered.       Relevant Medications   tadalafil (CIALIS) 20 MG tablet       I am having Ronald Okey RegalrtPatsy Baltimoreain his OneTouch Verio, glucose blood, folic acid, atorvastatin, tadalafil, omeprazole, metFORMIN, amLODipine, losartan, Insulin Glargine-yfgn, ezetimibe, gabapentin, hydrochlorothiazide, Ozempic (1 MG/DOSE), and tadalafil.   Meds ordered this encounter  Medications  . tadalafil (CIALIS) 20 MG tablet    Sig: TAKE 1 TABLET BY MOUTH ONCE DAILY AS NEEDED FOR  ERECTILE  DYSFUNCTION  -  EFFECTS  MAY  LAST  UP  TO  36  HRS    Dispense:  30 tablet    Refill:  0    Order Specific Question:   Supervising Provider    Answer:   TULLO, Crecencio Mc    Return precautions  given.   Risks, benefits, and alternatives of the medications and treatment plan prescribed today were discussed, and patient expressed understanding.   Education regarding symptom management and diagnosis given to patient on AVS.  Continue to follow with Ronald Pruitt,Ronald Hawthorneor routine health maintenance.   Ronald Pruitt agreed with plan.   MargareMable Paris

## 2020-07-02 NOTE — Assessment & Plan Note (Signed)
Slightly elevated. Continue  losartan 100mg , amlodipine 10mg  and patient will confirm that he is on hctz 12.5mg  once he gets home.

## 2020-07-02 NOTE — Assessment & Plan Note (Signed)
Overdue for lipid panel. Continue zetia 10mg , lipitor 80mg . Lipid panel ordered.

## 2020-07-02 NOTE — Patient Instructions (Addendum)
Please call and confirm that you ARE taking hctz 12.5mg . If you are taking, I would increase dose.   It is imperative that you are seen AT least twice per year for labs and monitoring. Monitor blood pressure at home and me 5-6 reading on separate days. Goal is less than 120/80, based on newest guidelines, however we certainly want to be less than 130/80;  if persistently higher, please make sooner follow up appointment so we can recheck you blood pressure and manage/ adjust medications.  Call me with 5-6 fasting blood sugars. Goal is less than 120.

## 2020-07-02 NOTE — Assessment & Plan Note (Addendum)
Anticipate improved however patient hasnt checked blood glucose. Reiterated the importance of checking glucose while on insulin for safety and to titrate medication. Continue ozempic 1mg  ,semglee 38 units, metformin 2000mg  qhs. He will call with fasting blood glucose.

## 2020-07-09 ENCOUNTER — Ambulatory Visit (INDEPENDENT_AMBULATORY_CARE_PROVIDER_SITE_OTHER): Payer: Commercial Managed Care - PPO

## 2020-07-09 ENCOUNTER — Other Ambulatory Visit: Payer: Self-pay

## 2020-07-09 DIAGNOSIS — E1140 Type 2 diabetes mellitus with diabetic neuropathy, unspecified: Secondary | ICD-10-CM

## 2020-07-09 DIAGNOSIS — I10 Essential (primary) hypertension: Secondary | ICD-10-CM | POA: Diagnosis not present

## 2020-07-09 NOTE — Progress Notes (Signed)
Patient presented in office for a Ozempic teaching. Patient brought in his Ozempic and Semglee insulin but admits to knowing how to do his injections. Pt was on both medications for 6 months. Pt is complaining about a burning sensation when giving his injections. Neel states that both injections leave a red and swollen welt after injecting and only the Insulin burns when injecting. Pt admits to prematurely removing the pen from the injection site due to the burning sensation. Pt declines any welts lasting and came into the office with no welts on his skin. Pt states that the welts and redness do not travel and are only found at the site of injection. Pt states that he is in no distress after taking the medication. Pt reports no SOB after taking the injection and states that it does not last for long. Spoke with Kerin Salen, RN and recommended to the patient to change the injection site to top of the thigh or back of the arm. Also recommended that patient leave their medication out for an hour before injection and not taking their medicine immediately after removing from the fridge. Patient gave an injection in office to show injection skills and be sure it is not being injected incorrectly. Redness did appear at injection site with no welt. Pt states that the injection went better than what he done on his own and was not that bad. Pt states that his previous insulin also burned during injection. Pt voiced no other concern and stated that he will try the other injection sites and leaving his medication out and see if that causes same issues. Pt went to lab for blood draw.

## 2020-07-10 ENCOUNTER — Telehealth: Payer: Self-pay

## 2020-07-10 LAB — LDL CHOLESTEROL, DIRECT: Direct LDL: 137 mg/dL

## 2020-07-10 LAB — LIPID PANEL
Cholesterol: 177 mg/dL (ref 0–200)
HDL: 26 mg/dL — ABNORMAL LOW (ref >39.00)
NonHDL: 151.23
Total CHOL/HDL Ratio: 7
Triglycerides: 203 mg/dL — ABNORMAL HIGH (ref 0.0–149.0)
VLDL: 40.6 mg/dL — ABNORMAL HIGH (ref 0.0–40.0)

## 2020-07-10 LAB — COMPREHENSIVE METABOLIC PANEL
ALT: 25 U/L (ref 0–53)
AST: 17 U/L (ref 0–37)
Albumin: 4.2 g/dL (ref 3.5–5.2)
Alkaline Phosphatase: 73 U/L (ref 39–117)
BUN: 11 mg/dL (ref 6–23)
CO2: 24 mEq/L (ref 19–32)
Calcium: 9.5 mg/dL (ref 8.4–10.5)
Chloride: 102 mEq/L (ref 96–112)
Creatinine, Ser: 0.93 mg/dL (ref 0.40–1.50)
GFR: 94.69 mL/min (ref >60.00)
Glucose, Bld: 234 mg/dL — ABNORMAL HIGH (ref 70–99)
Potassium: 4.4 mEq/L (ref 3.5–5.1)
Sodium: 137 mEq/L (ref 135–145)
Total Bilirubin: 0.4 mg/dL (ref 0.2–1.2)
Total Protein: 7.2 g/dL (ref 6.0–8.3)

## 2020-07-10 NOTE — Telephone Encounter (Signed)
LMTCB for lab results.  

## 2020-07-27 ENCOUNTER — Other Ambulatory Visit: Payer: Self-pay | Admitting: Family

## 2020-07-27 DIAGNOSIS — N529 Male erectile dysfunction, unspecified: Secondary | ICD-10-CM

## 2020-08-01 NOTE — Telephone Encounter (Signed)
Call pt I provided courtesy refill of cialis last month however this is managed by urology , Ronald Pruitt Please ask him to call urology for refill going forward  I declined refill

## 2020-08-01 NOTE — Telephone Encounter (Signed)
LMTCB

## 2020-08-20 ENCOUNTER — Other Ambulatory Visit: Payer: Self-pay | Admitting: Family

## 2020-08-20 DIAGNOSIS — I10 Essential (primary) hypertension: Secondary | ICD-10-CM

## 2020-08-20 DIAGNOSIS — E1140 Type 2 diabetes mellitus with diabetic neuropathy, unspecified: Secondary | ICD-10-CM

## 2020-08-21 LAB — HM DIABETES EYE EXAM

## 2020-08-29 NOTE — Progress Notes (Signed)
08/30/20 12:49 PM   Ronald Pruitt. [birth year] [id number]  Referring provider:  Burnard Hawthorne, FNP [address] [place],  [place] [address]  Urological history: 1. Testosterone deficiency -contributing factors of age, diabetes and obesity -Testosterone level normalized without treatment  2. ED -contributing factors of age, BPH, testosterone deficiency, DM, HTN, HLD, sleep apnea, and stress -Managed with tadalafil 20 mg, on-demand dosing  3. BPH with LU TS -PSA pending  4.  Nephrolithiasis -Stone composition 80% calcium oxalate monohydrate and 20% calcium oxalate dihydrate -ESWL left renal stone 2021  Chief Complaint  Patient presents with   Nephrolithiasis     HPI: Ronald Pruitt. is a 50 y.o.male with testosterone deficiency, erectile dysfunction and BPH with LUTS who presents today for patient requested 1 year follow-up.   Testosterone deficiency  -contributing factors of age, diabetes and obesity - testosterone pending   Erectile Dysfunction  08/31/2019 SHIM score was 10, which was moderate ED. Today's score 11   His risk factors for ED are age, BPH, testosterone deficiency, DM, HTN, HLD, sleep apnea, and stress.   He states he starts off with good erection but it goes down prematurely.   He occasionally gets spontaneous erections.    SHIM     Row Name 08/30/20 1138         SHIM: Over the last 6 months:   How do you rate your confidence that you could get and keep an erection? Very Low     When you had erections with sexual stimulation, how often were your erections hard enough for penetration (entering your partner)? A Few Times (much less than half the time)     During sexual intercourse, how often were you able to maintain your erection after you had penetrated (entered) your partner? Sometimes (about half the time)     During sexual intercourse, how difficult was it to maintain your erection to completion of intercourse? Very  Difficult     When you attempted sexual intercourse, how often was it satisfactory for you? Sometimes (about half the time)           SHIM Total Score     SHIM 11             Score: 1-7 Severe ED 8-11 Moderate ED 12-16 Mild-Moderate ED 17-21 Mild ED 22-25 No ED   BPH WITH LUTS (prostate and/or bladder) 08/31/2019 IPSS score 13/3, PVR was 93m   IPSS today 11  He still experiences urgency, he states some days are better than others. 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   Patient has a family history of PCa.   Patient denies any modifying or aggravating factors.  Patient denies any gross hematuria, dysuria or suprapubic/flank pain.  Patient denies any fevers, chills, nausea or vomiting.      IPSS     Row Name 08/30/20 1100         International Prostate Symptom Score   How often have you had the sensation of not emptying your bladder? Less than half the time     How often have you had to urinate less than every two hours? About half the time     How often have you found you stopped and started again several times when you urinated? Not at All     How often have you found it difficult to postpone urination? Less than 1 in 5 times     How often have  you had a weak urinary stream? Less than 1 in 5 times     How often have you had to strain to start urination? Less than 1 in 5 times     How many times did you typically get up at night to urinate? 3 Times     Total IPSS Score 11           Quality of Life due to urinary symptoms     If you were to spend the rest of your life with your urinary condition just the way it is now how would you feel about that? Unhappy             Score:  1-7 Mild 8-19 Moderate 20-35 Severe     Nephrolithiasis  Incidental finding of a 13 mm left renal stone on 2017 CT.  KUB (04/22/2017) noted a coarse approximately 10 x 12 mm calcification projecting over the upper pole of the left kidney may reflect a parenchymal  calcification or a stone.  He does not have any flank pain or gross hematuria at this time, but he would like definitive treatment for his stones as he is concerned that the stones may migrate or cause issues in the future.   09/29/2019 patient patient underwent ESWL for left renal stone with Dr. Erlene Quan. Their postprocedural course was as expected and uneventful.  They have passed fragments.  They bring in fragments for analysis.   KUB the left 12 mm calculus appears unchanged     PMH: Past Medical History:  Diagnosis Date   Arthritis    BPH (benign prostatic hyperplasia)    Diabetes (HCC)    Dysuria    GERD (gastroesophageal reflux disease)    History of kidney stones    HTN (hypertension)    Hyperlipidemia    Impotence    Nocturia    Obesity    Pneumonia age 40's   Sleep apnea    CPAP - severe (sleep study 01/11/16)   Testicular hypofunction    Wears dentures    full upper and lower    Surgical History: Past Surgical History:  Procedure Laterality Date   CHOLECYSTECTOMY     COLONOSCOPY WITH PROPOFOL N/A 10/12/2018   Procedure: COLONOSCOPY WITH PROPOFOL;  Surgeon: Lucilla Lame, MD;  Location: [facility];  Service: Endoscopy;  Laterality: N/A;   EXTRACORPOREAL SHOCK WAVE LITHOTRIPSY Left 09/29/2019   Procedure: EXTRACORPOREAL SHOCK WAVE LITHOTRIPSY (ESWL);  Surgeon: Hollice Espy, MD;  Location: [facility] OR[facility];  Service: Urology;  Laterality: Left;   NASAL SEPTOPLASTY W/ TURBINOPLASTY N/A 05/20/2017   Procedure: NASAL SEPTOPLASTY WITH SUBMUCOUS RESECTION;  Surgeon: Clyde Canterbury, MD;  Location: [facility] OR[facility];  Service: ENT;  Laterality: N/A;   VASECTOMY      Home Medications:  Allergies as of 08/30/2020   No Known Allergies      Medication List        Accurate as of August 30, 2020 12:49 PM. If you have any questions, ask your nurse or doctor.          alprostadil 1000 MCG pellet Commonly known as: Muse 1 each (1,000 mcg total) by Transurethral route as needed for  erectile dysfunction. use no more than 3 times per week Started by: SHANNON MCGOWAN, PA-C   amLODipine 10 MG tablet Commonly known as: NORVASC Take 1 tablet (10 mg total) by mouth daily.   atorvastatin 80 MG tablet Commonly known as: LIPITOR Take 1 tablet (80 mg total) by mouth daily at 6 PM.  ezetimibe 10 MG tablet Commonly known as: ZETIA Take 1 tablet (10 mg total) by mouth daily.   folic acid 1 MG tablet Commonly known as: FOLVITE Take 1 tablet (1 mg total) by mouth daily.   gabapentin 100 MG capsule Commonly known as: NEURONTIN TAKE 1 CAPSULE THREE TIMES A DAY   glucose blood test strip Use to check blood sugar up to 3 times daily   hydrochlorothiazide 12.5 MG capsule Commonly known as: MICROZIDE TAKE 1 CAPSULE DAILY   insulin glargine-yfgn 100 UNIT/ML injection Commonly known as: Semglee (yfgn) Inject 48 Units into the skin daily.   losartan 100 MG tablet Commonly known as: COZAAR Take 1 tablet (100 mg total) by mouth daily.   metFORMIN 500 MG 24 hr tablet Commonly known as: GLUCOPHAGE-XR Take 4 tablets (2,000 mg total) by mouth every evening.   mirabegron ER 25 MG Tb24 tablet Commonly known as: MYRBETRIQ Take 1 tablet (25 mg total) by mouth daily. Started by: Zara Council, PA-C   omeprazole 20 MG capsule Commonly known as: PRILOSEC Take 1 capsule (20 mg total) by mouth daily.   OneTouch Verio w/Device Kit 1 Device by Does not apply route daily.   Ozempic (1 MG/DOSE) 2 MG/1.5ML Sopn Generic drug: Semaglutide (1 MG/DOSE) Inject 1 mg into the skin once a week.   tadalafil 20 MG tablet Commonly known as: CIALIS TAKE 1 TABLET BY MOUTH ONCE DAILY AS NEEDED FOR  ERECTILE  DYSFUNCTION  -  EFFECTS  MAY  LAST  UP  TO  36  HRS   tadalafil 5 MG tablet Commonly known as: CIALIS Take 1 tablet (5 mg total) by mouth daily as needed for erectile dysfunction.        Allergies: No Known Allergies  Family History: Family History  Problem Relation Age  of Onset   Hyperlipidemia Mother    Diabetes Mellitus II Mother    Seizures Mother    Diabetes Mother    Mental illness Mother    Arthritis Mother    Diabetes Father    Cirrhosis Father        non alcoholic   Hyperlipidemia Father    Arthritis Father    Liver cancer Father    Diabetes Maternal Grandmother    Hyperlipidemia Maternal Grandmother    Arthritis Maternal Grandmother    Diabetes Maternal Grandfather    Hyperlipidemia Maternal Grandfather    Arthritis Maternal Grandfather    Diabetes Paternal Grandmother    Hyperlipidemia Paternal Grandmother    Arthritis Paternal Grandmother    Diabetes Paternal Grandfather    Hyperlipidemia Paternal Grandfather    Arthritis Paternal Grandfather    Kidney disease Neg Hx    Prostate cancer Neg Hx    Kidney cancer Neg Hx    Bladder Cancer Neg Hx    Colon cancer Neg Hx    Thyroid cancer Neg Hx     Social History:  reports that he has been smoking cigarettes. He has a 10.00 pack-year smoking history. He has never used smokeless tobacco. He reports that he does not drink alcohol and does not use drugs.   Physical Exam: BP 127/84   Pulse 98   Ht 6' (1.829 m)   Wt 238 lb (108 kg)   BMI 32.28 kg/m   Constitutional:  Well nourished. Alert and oriented, No acute distress. HEENT:  AT, mask in place  Trachea midline Cardiovascular: No clubbing, cyanosis, or edema. Respiratory: Normal respiratory effort, no increased work of breathing. GU: No CVA tenderness.  No bladder fullness or masses.  Patient with circumcised phallus.  Urethral meatus is patent.  No penile discharge. No penile lesions or rashes. Scrotum without lesions, cysts, rashes and/or edema.  Testicles are located scrotally bilaterally. No masses are appreciated in the testicles. Left and right epididymis are normal.  Bilateral varicoceles noted.  The Left varicoceles with firmness.   Rectal: Patient with  normal sphincter tone. Anus and perineum without scarring or rashes.  No rectal masses are appreciated. Prostate is approximately 45 grams, no nodules are appreciated. Seminal vesicles could not be palpated Neurologic: Grossly intact, no focal deficits, moving all 4 extremities. Psychiatric: Normal mood and affect.   Laboratory Data: Component     Latest Ref Rng & Units 07/09/2020  Direct LDL     mg/dL 137.0   Component     Latest Ref Rng & Units 07/09/2020  Sodium     135 - 145 mEq/L 137  Potassium     3.5 - 5.1 mEq/L 4.4  Chloride     96 - 112 mEq/L 102  CO2     19 - 32 mEq/L 24  Glucose     70 - 99 mg/dL 234 (H)  BUN     6 - 23 mg/dL 11  Creatinine     0.40 - 1.50 mg/dL 0.93  Total Bilirubin     0.2 - 1.2 mg/dL 0.4  Alkaline Phosphatase     39 - 117 U/L 73  AST     0 - 37 U/L 17  ALT     0 - 53 U/L 25  Total Protein     6.0 - 8.3 g/dL 7.2  Albumin     3.5 - 5.2 g/dL 4.2  Calcium     8.4 - 10.5 mg/dL 9.5  GFR     >60.00 mL/min 94.69   Component     Latest Ref Rng & Units 07/09/2020  Cholesterol     0 - 200 mg/dL 177  Triglycerides     0.0 - 149.0 mg/dL 203.0 (H)  HDL Cholesterol     >39.00 mg/dL 26.00 (L)  VLDL     0.0 - 40.0 mg/dL 40.6 (H)  Total CHOL/HDL Ratio      7  NonHDL      151.23   Lab Results  Component Value Date   HGBA1C 7.7 (A) 05/21/2020  I have reviewed the labs.   Pertinent Imaging: CLINICAL DATA:  Left-sided kidney stone.  Lithotripsy 1 year ago.   EXAM: ABDOMEN - 1 VIEW   COMPARISON:  Radiograph 10/21/2019.  CT 09/12/2019   FINDINGS: 12 mm stone or adjacent stones project over the upper left renal shadow, not significantly changed from prior exam. No visualized right renal calculi. No stones along the course of the ureters or in the pelvis. Normal bowel gas pattern with small volume of colonic stool. Cholecystectomy clips in the right upper quadrant. No acute osseous abnormalities are seen.   IMPRESSION: Unchanged 12 mm stone or adjacent stones in the upper left kidney.      Electronically Signed   By: Keith Rake M.D.   On: 08/31/2020 20:28 I have personally reviewed the images.  See HPI.     Assessment & Plan:    1. Left renal stone - patient with persistent fragments on KUB - he would like a repeat ESWL on his remaining fragments -Explained to the patient that with ESWL, shock waves are focused on the stone using X-rays to pinpoint the  stone.  The shock waves are fired repeatedly which usually causes the stone to break into small pieces which pass out in the urine over the next few weeks.  They will go home that day and may be able to resume normal activities in three days.  They should be given a strainer and they need to collect any fragments/sediments that they pass for analysis.   Risks involved with the procedure consist of bruising of the skin and kidney, possible long-term kidney damage, development of HTN, damage to the bowel, spleen, liver, pancreas, male organs or lungs, hematuria, hematuria serious enough to require transfusion or surgical repair, formation of a hematoma, infection or sepsis.  If the stone is too large or dense, it may not break apart or break apart in large pieces and cause "Steinstrasse."  If this happens, it would result in the need for another procedure (likely URS/LL/stent placement).  IV sedation is typically used, but in rare instances general anesthesia is used with the risk of irregular heart beat, irregular BP, stroke, MI, CVA, paralysis, coma and/or death.  -schedule left ESWL  2. BPH with LU TS -PSA pending -DRE benign  -continue tamsulosin 0.4 mg daily  3. 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 ED -continue tadalafil 20 mg on demand  dosing -dicussed ICI, but patient hesitant with injections -? Insurance would cover MUSE, so prescription send to pharmacy -reassess at ESWL follow up   5. Bilateral varicoceles.  -Scrotal ultrasound for further evaluation of the indurated left varicocele which I suspect are thrombosed veins  -call patient with results   Follow-up for Left ESWL.  [facility] [address], [place], [place] [address] [phone]

## 2020-08-29 NOTE — H&P (View-Only) (Signed)
08/30/20 12:49 PM   Ronald Pruitt. [birth year] [id number]  Referring provider:  Burnard Hawthorne, FNP [address] [place],  [place] [address]  Urological history: 1. Testosterone deficiency -contributing factors of age, diabetes and obesity -Testosterone level normalized without treatment  2. ED -contributing factors of age, BPH, testosterone deficiency, DM, HTN, HLD, sleep apnea, and stress -Managed with tadalafil 20 mg, on-demand dosing  3. BPH with LU TS -PSA pending  4.  Nephrolithiasis -Stone composition 80% calcium oxalate monohydrate and 20% calcium oxalate dihydrate -ESWL left renal stone 2021  Chief Complaint  Patient presents with   Nephrolithiasis     HPI: Ronald Pruitt. is a 50 y.o.male with testosterone deficiency, erectile dysfunction and BPH with LUTS who presents today for patient requested 1 year follow-up.   Testosterone deficiency  -contributing factors of age, diabetes and obesity - testosterone pending   Erectile Dysfunction  08/31/2019 SHIM score was 10, which was moderate ED. Today's score 11   His risk factors for ED are age, BPH, testosterone deficiency, DM, HTN, HLD, sleep apnea, and stress.   He states he starts off with good erection but it goes down prematurely.   He occasionally gets spontaneous erections.    SHIM     Row Name 08/30/20 1138         SHIM: Over the last 6 months:   How do you rate your confidence that you could get and keep an erection? Very Low     When you had erections with sexual stimulation, how often were your erections hard enough for penetration (entering your partner)? A Few Times (much less than half the time)     During sexual intercourse, how often were you able to maintain your erection after you had penetrated (entered) your partner? Sometimes (about half the time)     During sexual intercourse, how difficult was it to maintain your erection to completion of intercourse? Very  Difficult     When you attempted sexual intercourse, how often was it satisfactory for you? Sometimes (about half the time)           SHIM Total Score     SHIM 11             Score: 1-7 Severe ED 8-11 Moderate ED 12-16 Mild-Moderate ED 17-21 Mild ED 22-25 No ED   BPH WITH LUTS (prostate and/or bladder) 08/31/2019 IPSS score 13/3, PVR was 93m   IPSS today 11  He still experiences urgency, he states some days are better than others. 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   Patient has a family history of PCa.   Patient denies any modifying or aggravating factors.  Patient denies any gross hematuria, dysuria or suprapubic/flank pain.  Patient denies any fevers, chills, nausea or vomiting.      IPSS     Row Name 08/30/20 1100         International Prostate Symptom Score   How often have you had the sensation of not emptying your bladder? Less than half the time     How often have you had to urinate less than every two hours? About half the time     How often have you found you stopped and started again several times when you urinated? Not at All     How often have you found it difficult to postpone urination? Less than 1 in 5 times     How often have  you had a weak urinary stream? Less than 1 in 5 times     How often have you had to strain to start urination? Less than 1 in 5 times     How many times did you typically get up at night to urinate? 3 Times     Total IPSS Score 11           Quality of Life due to urinary symptoms     If you were to spend the rest of your life with your urinary condition just the way it is now how would you feel about that? Unhappy             Score:  1-7 Mild 8-19 Moderate 20-35 Severe     Nephrolithiasis  Incidental finding of a 13 mm left renal stone on 2017 CT.  KUB (04/22/2017) noted a coarse approximately 10 x 12 mm calcification projecting over the upper pole of the left kidney may reflect a parenchymal  calcification or a stone.  He does not have any flank pain or gross hematuria at this time, but he would like definitive treatment for his stones as he is concerned that the stones may migrate or cause issues in the future.   09/29/2019 patient patient underwent ESWL for left renal stone with Dr. Brandon. Their postprocedural course was as expected and uneventful.  They have passed fragments.  They bring in fragments for analysis.   KUB the left 12 mm calculus appears unchanged     PMH: Past Medical History:  Diagnosis Date   Arthritis    BPH (benign prostatic hyperplasia)    Diabetes (HCC)    Dysuria    GERD (gastroesophageal reflux disease)    History of kidney stones    HTN (hypertension)    Hyperlipidemia    Impotence    Nocturia    Obesity    Pneumonia age 20's   Sleep apnea    CPAP - severe (sleep study 01/11/16)   Testicular hypofunction    Wears dentures    full upper and lower    Surgical History: Past Surgical History:  Procedure Laterality Date   CHOLECYSTECTOMY     COLONOSCOPY WITH PROPOFOL N/A 10/12/2018   Procedure: COLONOSCOPY WITH PROPOFOL;  Surgeon: Wohl, Darren, MD;  Location: [facility];  Service: Endoscopy;  Laterality: N/A;   EXTRACORPOREAL SHOCK WAVE LITHOTRIPSY Left 09/29/2019   Procedure: EXTRACORPOREAL SHOCK WAVE LITHOTRIPSY (ESWL);  Surgeon: Brandon, Ashley, MD;  Location: [facility] ORS;  Service: Urology;  Laterality: Left;   NASAL SEPTOPLASTY W/ TURBINOPLASTY N/A 05/20/2017   Procedure: NASAL SEPTOPLASTY WITH SUBMUCOUS RESECTION;  Surgeon: Bennett, Paul, MD;  Location: [facility] ORS;  Service: ENT;  Laterality: N/A;   VASECTOMY      Home Medications:  Allergies as of 08/30/2020   No Known Allergies      Medication List        Accurate as of August 30, 2020 12:49 PM. If you have any questions, ask your nurse or doctor.          alprostadil 1000 MCG pellet Commonly known as: Muse 1 each (1,000 mcg total) by Transurethral route as needed for  erectile dysfunction. use no more than 3 times per week Started by: Marcy Sookdeo, PA-C   amLODipine 10 MG tablet Commonly known as: NORVASC Take 1 tablet (10 mg total) by mouth daily.   atorvastatin 80 MG tablet Commonly known as: LIPITOR Take 1 tablet (80 mg total) by mouth daily at 6 PM.     ezetimibe 10 MG tablet Commonly known as: ZETIA Take 1 tablet (10 mg total) by mouth daily.   folic acid 1 MG tablet Commonly known as: FOLVITE Take 1 tablet (1 mg total) by mouth daily.   gabapentin 100 MG capsule Commonly known as: NEURONTIN TAKE 1 CAPSULE THREE TIMES A DAY   glucose blood test strip Use to check blood sugar up to 3 times daily   hydrochlorothiazide 12.5 MG capsule Commonly known as: MICROZIDE TAKE 1 CAPSULE DAILY   insulin glargine-yfgn 100 UNIT/ML injection Commonly known as: Semglee (yfgn) Inject 48 Units into the skin daily.   losartan 100 MG tablet Commonly known as: COZAAR Take 1 tablet (100 mg total) by mouth daily.   metFORMIN 500 MG 24 hr tablet Commonly known as: GLUCOPHAGE-XR Take 4 tablets (2,000 mg total) by mouth every evening.   mirabegron ER 25 MG Tb24 tablet Commonly known as: MYRBETRIQ Take 1 tablet (25 mg total) by mouth daily. Started by: Santiana Glidden, PA-C   omeprazole 20 MG capsule Commonly known as: PRILOSEC Take 1 capsule (20 mg total) by mouth daily.   OneTouch Verio w/Device Kit 1 Device by Does not apply route daily.   Ozempic (1 MG/DOSE) 2 MG/1.5ML Sopn Generic drug: Semaglutide (1 MG/DOSE) Inject 1 mg into the skin once a week.   tadalafil 20 MG tablet Commonly known as: CIALIS TAKE 1 TABLET BY MOUTH ONCE DAILY AS NEEDED FOR  ERECTILE  DYSFUNCTION  -  EFFECTS  MAY  LAST  UP  TO  36  HRS   tadalafil 5 MG tablet Commonly known as: CIALIS Take 1 tablet (5 mg total) by mouth daily as needed for erectile dysfunction.        Allergies: No Known Allergies  Family History: Family History  Problem Relation Age  of Onset   Hyperlipidemia Mother    Diabetes Mellitus II Mother    Seizures Mother    Diabetes Mother    Mental illness Mother    Arthritis Mother    Diabetes Father    Cirrhosis Father        non alcoholic   Hyperlipidemia Father    Arthritis Father    Liver cancer Father    Diabetes Maternal Grandmother    Hyperlipidemia Maternal Grandmother    Arthritis Maternal Grandmother    Diabetes Maternal Grandfather    Hyperlipidemia Maternal Grandfather    Arthritis Maternal Grandfather    Diabetes Paternal Grandmother    Hyperlipidemia Paternal Grandmother    Arthritis Paternal Grandmother    Diabetes Paternal Grandfather    Hyperlipidemia Paternal Grandfather    Arthritis Paternal Grandfather    Kidney disease Neg Hx    Prostate cancer Neg Hx    Kidney cancer Neg Hx    Bladder Cancer Neg Hx    Colon cancer Neg Hx    Thyroid cancer Neg Hx     Social History:  reports that he has been smoking cigarettes. He has a 10.00 pack-year smoking history. He has never used smokeless tobacco. He reports that he does not drink alcohol and does not use drugs.   Physical Exam: BP 127/84   Pulse 98   Ht 6' (1.829 m)   Wt 238 lb (108 kg)   BMI 32.28 kg/m   Constitutional:  Well nourished. Alert and oriented, No acute distress. HEENT: [place] AT, mask in place  Trachea midline Cardiovascular: No clubbing, cyanosis, or edema. Respiratory: Normal respiratory effort, no increased work of breathing. GU: No CVA tenderness.    No bladder fullness or masses.  Patient with circumcised phallus.  Urethral meatus is patent.  No penile discharge. No penile lesions or rashes. Scrotum without lesions, cysts, rashes and/or edema.  Testicles are located scrotally bilaterally. No masses are appreciated in the testicles. Left and right epididymis are normal.  Bilateral varicoceles noted.  The Left varicoceles with firmness.   Rectal: Patient with  normal sphincter tone. Anus and perineum without scarring or rashes.  No rectal masses are appreciated. Prostate is approximately 45 grams, no nodules are appreciated. Seminal vesicles could not be palpated Neurologic: Grossly intact, no focal deficits, moving all 4 extremities. Psychiatric: Normal mood and affect.   Laboratory Data: Component     Latest Ref Rng & Units 07/09/2020  Direct LDL     mg/dL 137.0   Component     Latest Ref Rng & Units 07/09/2020  Sodium     135 - 145 mEq/L 137  Potassium     3.5 - 5.1 mEq/L 4.4  Chloride     96 - 112 mEq/L 102  CO2     19 - 32 mEq/L 24  Glucose     70 - 99 mg/dL 234 (H)  BUN     6 - 23 mg/dL 11  Creatinine     0.40 - 1.50 mg/dL 0.93  Total Bilirubin     0.2 - 1.2 mg/dL 0.4  Alkaline Phosphatase     39 - 117 U/L 73  AST     0 - 37 U/L 17  ALT     0 - 53 U/L 25  Total Protein     6.0 - 8.3 g/dL 7.2  Albumin     3.5 - 5.2 g/dL 4.2  Calcium     8.4 - 10.5 mg/dL 9.5  GFR     >60.00 mL/min 94.69   Component     Latest Ref Rng & Units 07/09/2020  Cholesterol     0 - 200 mg/dL 177  Triglycerides     0.0 - 149.0 mg/dL 203.0 (H)  HDL Cholesterol     >39.00 mg/dL 26.00 (L)  VLDL     0.0 - 40.0 mg/dL 40.6 (H)  Total CHOL/HDL Ratio      7  NonHDL      151.23   Lab Results  Component Value Date   HGBA1C 7.7 (A) 05/21/2020  I have reviewed the labs.   Pertinent Imaging: CLINICAL DATA:  Left-sided kidney stone.  Lithotripsy 1 year ago.   EXAM: ABDOMEN - 1 VIEW   COMPARISON:  Radiograph 10/21/2019.  CT 09/12/2019   FINDINGS: 12 mm stone or adjacent stones project over the upper left renal shadow, not significantly changed from prior exam. No visualized right renal calculi. No stones along the course of the ureters or in the pelvis. Normal bowel gas pattern with small volume of colonic stool. Cholecystectomy clips in the right upper quadrant. No acute osseous abnormalities are seen.   IMPRESSION: Unchanged 12 mm stone or adjacent stones in the upper left kidney.      Electronically Signed   By: Keith Rake M.D.   On: 08/31/2020 20:28 I have personally reviewed the images.  See HPI.     Assessment & Plan:    1. Left renal stone - patient with persistent fragments on KUB - he would like a repeat ESWL on his remaining fragments -Explained to the patient that with ESWL, shock waves are focused on the stone using X-rays to pinpoint the  stone.  The shock waves are fired repeatedly which usually causes the stone to break into small pieces which pass out in the urine over the next few weeks.  They will go home that day and may be able to resume normal activities in three days.  They should be given a strainer and they need to collect any fragments/sediments that they pass for analysis.   Risks involved with the procedure consist of bruising of the skin and kidney, possible long-term kidney damage, development of HTN, damage to the bowel, spleen, liver, pancreas, male organs or lungs, hematuria, hematuria serious enough to require transfusion or surgical repair, formation of a hematoma, infection or sepsis.  If the stone is too large or dense, it may not break apart or break apart in large pieces and cause "Steinstrasse."  If this happens, it would result in the need for another procedure (likely URS/LL/stent placement).  IV sedation is typically used, but in rare instances general anesthesia is used with the risk of irregular heart beat, irregular BP, stroke, MI, CVA, paralysis, coma and/or death.  -schedule left ESWL  2. BPH with LU TS -PSA pending -DRE benign  -continue tamsulosin 0.4 mg daily  3. 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 ED -continue tadalafil 20 mg on demand  dosing -dicussed ICI, but patient hesitant with injections -? Insurance would cover MUSE, so prescription send to pharmacy -reassess at ESWL follow up   5. Bilateral varicoceles.  -Scrotal ultrasound for further evaluation of the indurated left varicocele which I suspect are thrombosed veins  -call patient with results   Follow-up for Left ESWL.  [facility] [address] [place],  [address] [phone]

## 2020-08-30 ENCOUNTER — Ambulatory Visit
Admission: RE | Admit: 2020-08-30 | Discharge: 2020-08-30 | Disposition: A | Discharge status: Home / Self Care | Payer: Commercial Managed Care - PPO | Source: Ambulatory Visit | Attending: Urology | Admitting: Urology

## 2020-08-30 ENCOUNTER — Ambulatory Visit: Payer: Commercial Managed Care - PPO | Admitting: Urology

## 2020-08-30 ENCOUNTER — Ambulatory Visit
Admission: RE | Admit: 2020-08-30 | Discharge: 2020-08-30 | Disposition: A | Discharge status: Home / Self Care | Payer: Commercial Managed Care - PPO | Attending: Urology | Admitting: Urology

## 2020-08-30 ENCOUNTER — Other Ambulatory Visit: Payer: Self-pay

## 2020-08-30 ENCOUNTER — Encounter: Payer: Self-pay | Admitting: Urology

## 2020-08-30 VITALS — BP 127/84 | HR 98 | Ht 72.0 in | Wt 238.0 lb

## 2020-08-30 DIAGNOSIS — N401 Enlarged prostate with lower urinary tract symptoms: Secondary | ICD-10-CM | POA: Diagnosis not present

## 2020-08-30 DIAGNOSIS — N529 Male erectile dysfunction, unspecified: Secondary | ICD-10-CM | POA: Diagnosis not present

## 2020-08-30 DIAGNOSIS — N200 Calculus of kidney: Secondary | ICD-10-CM | POA: Diagnosis not present

## 2020-08-30 DIAGNOSIS — I861 Scrotal varices: Secondary | ICD-10-CM

## 2020-08-30 DIAGNOSIS — R351 Nocturia: Secondary | ICD-10-CM

## 2020-08-30 DIAGNOSIS — N138 Other obstructive and reflux uropathy: Secondary | ICD-10-CM

## 2020-08-30 MED ORDER — MIRABEGRON ER 25 MG PO TB24: 25.0000 mg | ORAL_TABLET | Freq: Every day | ORAL | 0 refills | Status: DC

## 2020-08-30 MED ORDER — ALPROSTADIL (VASODILATOR) 1000 MCG UR PLLT: 1000.0000 ug | PELLET | URETHRAL | 12 refills | Status: DC | PRN

## 2020-08-30 MED ORDER — TADALAFIL 5 MG PO TABS
5.0000 mg | ORAL_TABLET | Freq: Every day | ORAL | 3 refills | Status: DC | PRN
Start: 2020-08-30 — End: 2021-08-20

## 2020-08-31 LAB — PSA: Prostate Specific Ag, Serum: 0.3 ng/mL (ref 0.0–4.0)

## 2020-08-31 LAB — TESTOSTERONE: Testosterone: 302 ng/dL (ref 264–916)

## 2020-09-04 IMAGING — CT CT RENAL STONE PROTOCOL
2 of 4 series · 16 of 46 positions shown, 18 images · non-contrast
Comparison: CT the chest, abdomen and pelvis 11/02/2015.

CLINICAL DATA: 49-year-old male with history of flank pain.

EXAM:
CT ABDOMEN AND PELVIS WITHOUT CONTRAST
TECHNIQUE: Multidetector CT imaging of the abdomen and pelvis was performed
following the standard protocol without IV contrast.

[Series 2: renal stone 5.00 · axial · 0.98mm/px · z∈[-1558,-1058]mm · 13 of 110 slices shown, 15 images]
[im 5/110  soft-tissue]
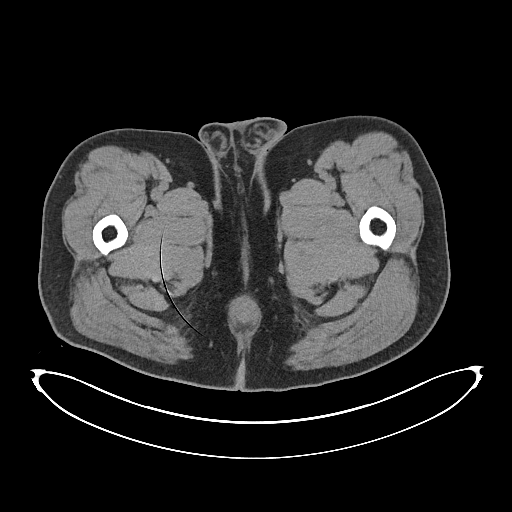
[im 5/110  bone]
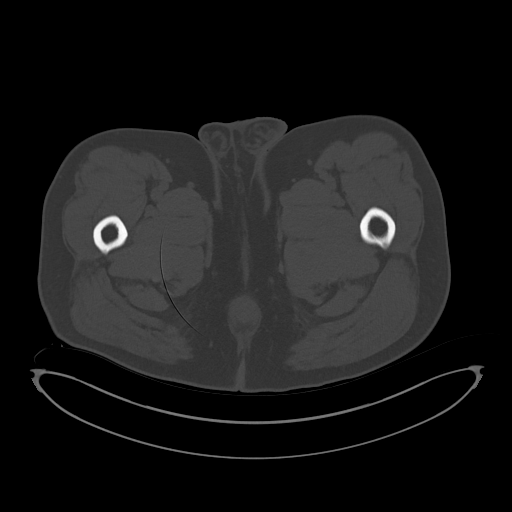
[im 14/110  soft-tissue]
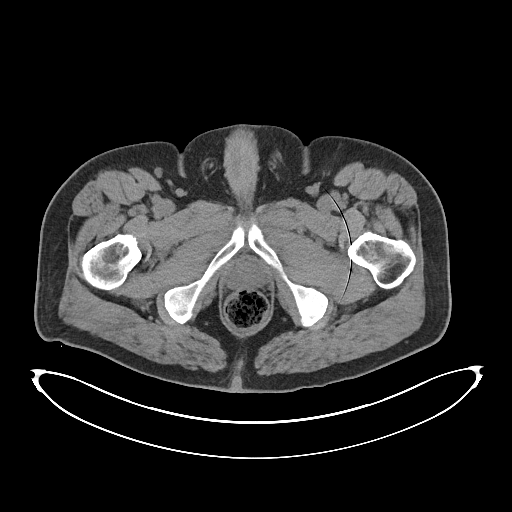
[im 23/110  soft-tissue]
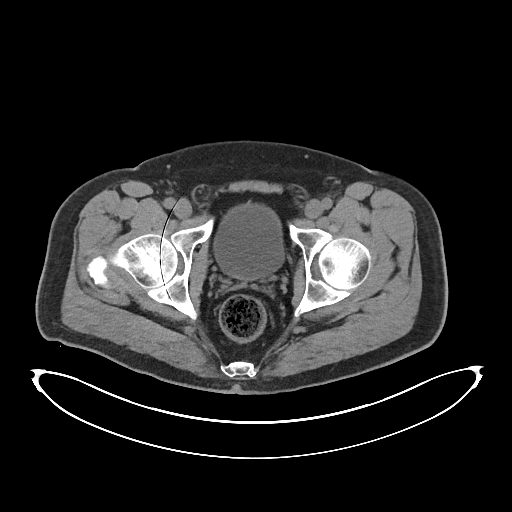
[im 32/110  soft-tissue]
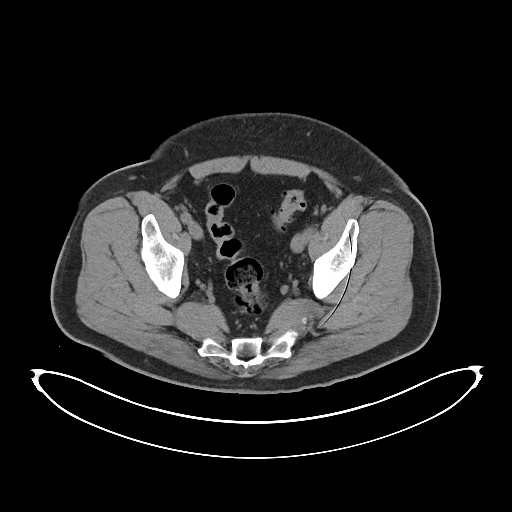
[im 37/110  soft-tissue]
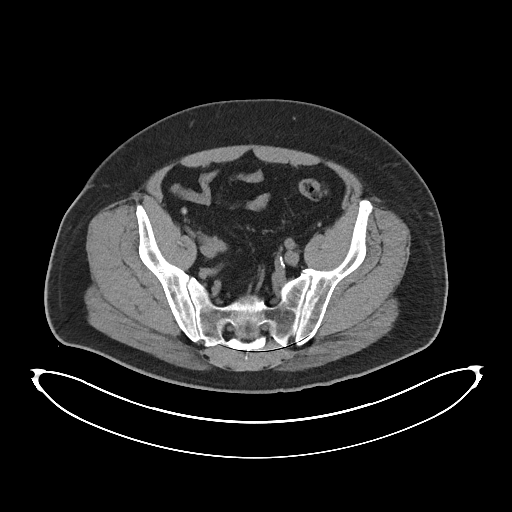
[im 46/110  soft-tissue]
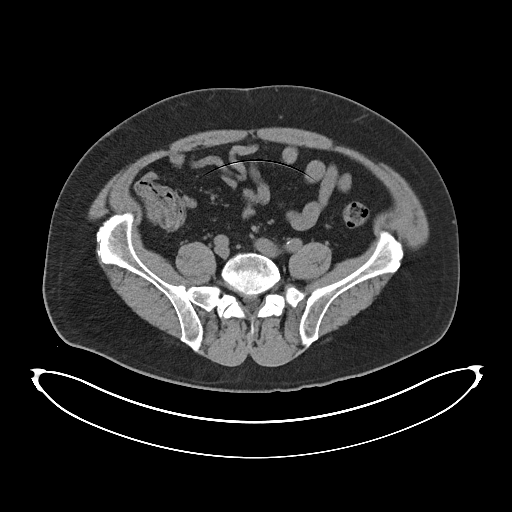
[im 55/110  soft-tissue]
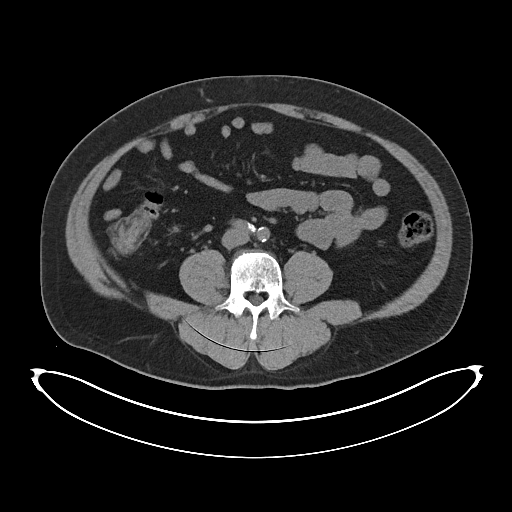
[im 64/110  soft-tissue]
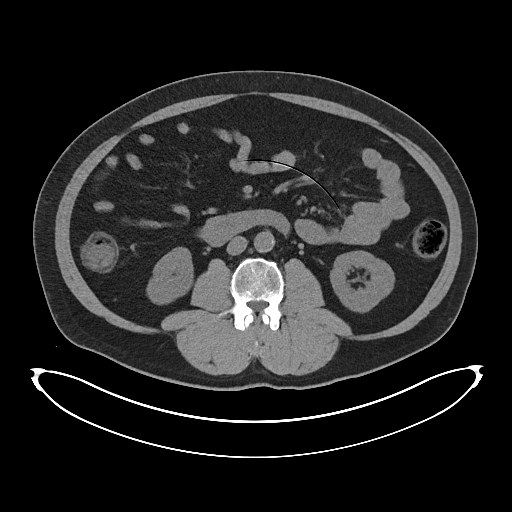
[im 73/110  soft-tissue]
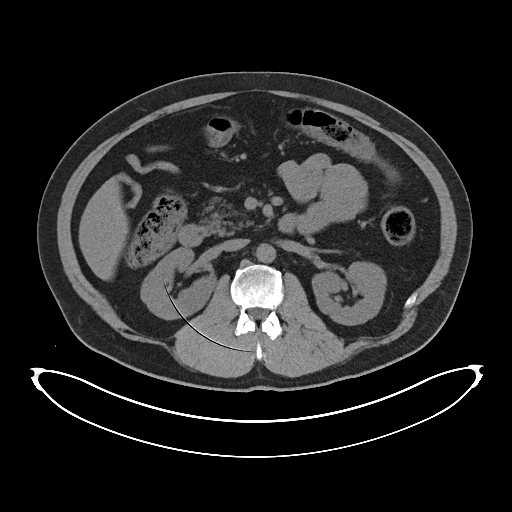
[im 73/110  bone]
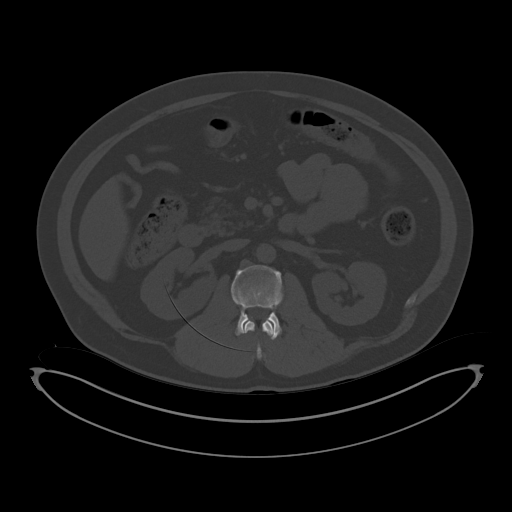
[im 78/110  soft-tissue]
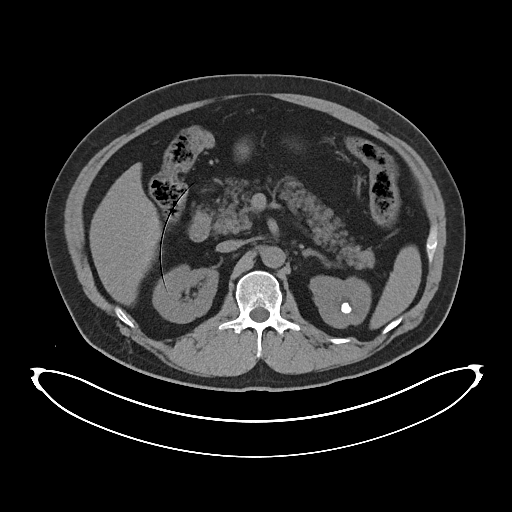
[im 87/110  soft-tissue]
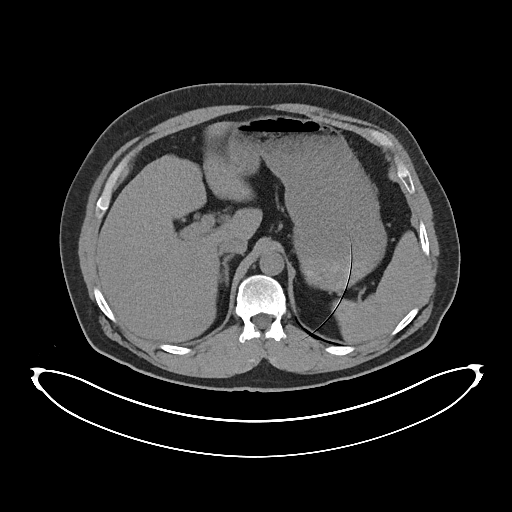
[im 96/110  soft-tissue]
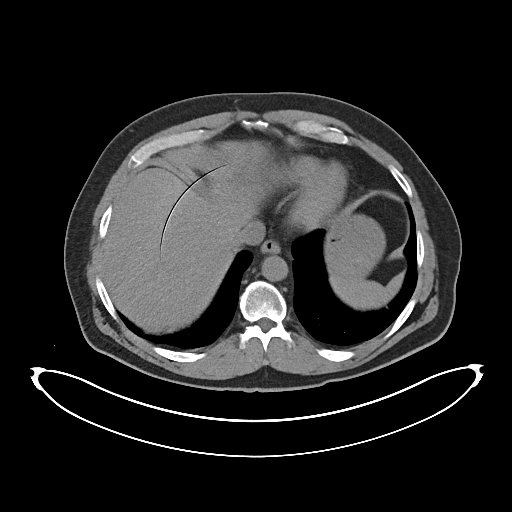
[im 105/110  soft-tissue]
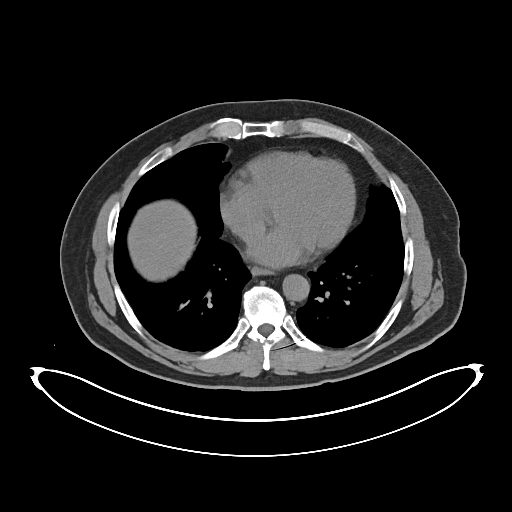

[Series 4: renal stone 2.00 cor · coronal · 0.86mm/px · 3 of 160 slices shown]
[im 54/160  soft-tissue]
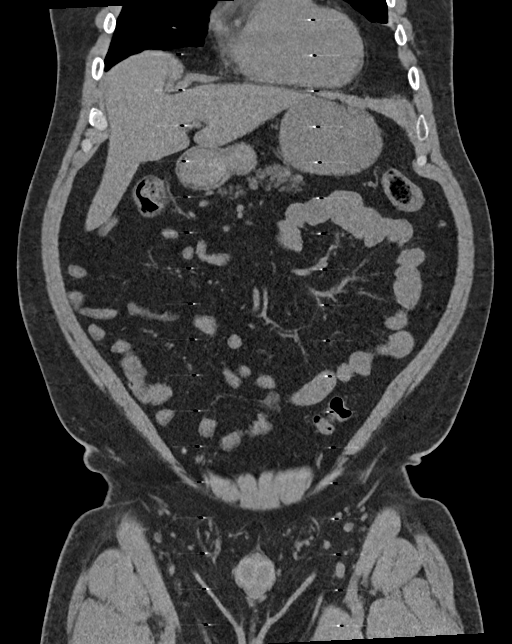
[im 71/160  soft-tissue]
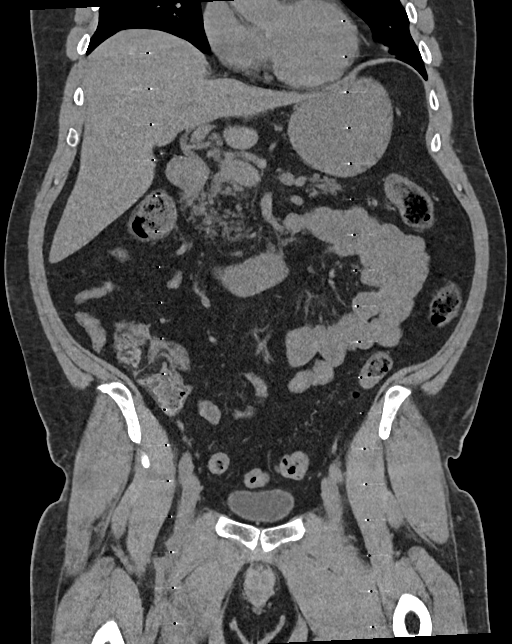
[im 89/160  soft-tissue]
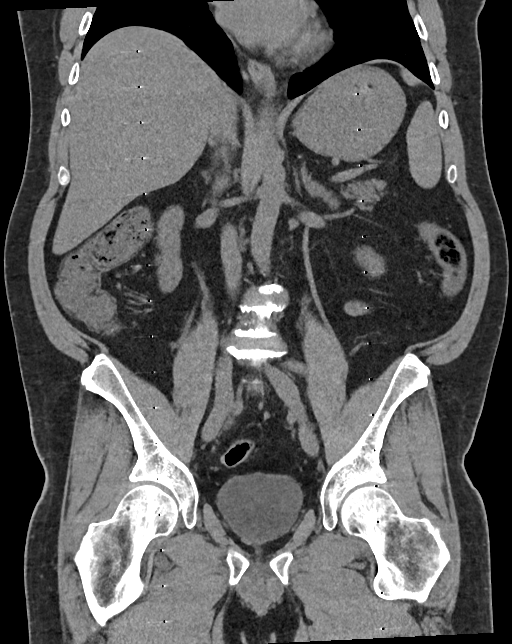

[16 of 46 positions shown; findings below may reference images not displayed]

FINDINGS: Lower chest: Unremarkable.

Hepatobiliary: No suspicious cystic or solid hepatic lesions are
confidently identified on today's noncontrast CT examination. Status
post cholecystectomy.

Pancreas: No definite pancreatic mass or peripancreatic fluid
collections or inflammatory changes are noted on today's noncontrast
CT examination.

Spleen: Unremarkable.

Adrenals/Urinary Tract: 11 mm nonobstructive calculus in the upper
pole collecting system of the left kidney. No additional calculi are
noted within the collecting system of the right kidney, along the
course of either ureter or within the lumen of the urinary bladder.
No hydroureteronephrosis. Unenhanced appearance of the kidneys and
bilateral adrenal glands is otherwise normal. Unenhanced appearance
of the urinary bladder is normal.

Stomach/Bowel: Unenhanced appearance of the stomach is normal. No
pathologic dilatation of small bowel or colon. Normal appendix.

Vascular/Lymphatic: Aortic atherosclerosis. No lymphadenopathy noted
in the abdomen or pelvis.

Reproductive: Prostate gland and seminal vesicles are unremarkable
in appearance.

Other: No significant volume of ascites.  No pneumoperitoneum.

Musculoskeletal: There are no aggressive appearing lytic or blastic
lesions noted in the visualized portions of the skeleton.
IMPRESSION: 1. 11 mm nonobstructive calculus in the upper pole collecting system
of left kidney. No ureteral stones or findings of urinary tract
obstruction are noted at this time.
2. Aortic atherosclerosis.

## 2020-09-06 ENCOUNTER — Telehealth: Payer: Self-pay | Admitting: Urology

## 2020-09-06 NOTE — Telephone Encounter (Signed)
LMOM for pt. To return my call to schedule Lithotripsy for 09/20/20

## 2020-09-17 ENCOUNTER — Telehealth: Payer: Self-pay

## 2020-09-17 NOTE — Telephone Encounter (Signed)
Pt has had sleep study , patient currently has a cpap machine that he doesn't use. Nothing further needed.

## 2020-09-18 ENCOUNTER — Ambulatory Visit: Payer: Commercial Managed Care - PPO | Admitting: Adult Health

## 2020-09-18 ENCOUNTER — Encounter: Payer: Self-pay | Admitting: Adult Health

## 2020-09-18 ENCOUNTER — Other Ambulatory Visit: Payer: Self-pay

## 2020-09-18 ENCOUNTER — Telehealth: Payer: Self-pay

## 2020-09-18 VITALS — BP 140/98 | HR 95 | Temp 97.9°F | Ht 72.0 in | Wt 241.0 lb

## 2020-09-18 DIAGNOSIS — Z720 Tobacco use: Secondary | ICD-10-CM | POA: Diagnosis not present

## 2020-09-18 DIAGNOSIS — G4733 Obstructive sleep apnea (adult) (pediatric): Secondary | ICD-10-CM

## 2020-09-18 DIAGNOSIS — G4719 Other hypersomnia: Secondary | ICD-10-CM | POA: Diagnosis not present

## 2020-09-18 NOTE — Telephone Encounter (Signed)
Called sleep med and waiting for them to fax over patients sleep study results.

## 2020-09-18 NOTE — Progress Notes (Signed)
_0  ID: Ronald Pruitt., male    DOB: [birth year], 50 y.o.   MRN: [id number]  Chief Complaint  Patient presents with   Consult    Referring provider: Burnard Hawthorne, FNP  HPI: 50 year old male active smoker seen for sleep consult September 18, 2020 to establish for sleep apnea Medical history significant for DM , Hypertension , Hyperlipidemia ,   TEST/EVENTS :   09/18/2020 Sleep Consult  Patient presents for a sleep consult to establish for obstructive sleep apnea.  Diagnosed with OSA 8 yrs ago, but did not start CPAP . Continued to have ongoing symptoms, referred to ENT . Sleep study showed severe OSA , started on CPAP in 2018. Sleep study results requested.  Patient says he has not been able to tolerate his CPAP very well.  He tries to wear it but then wakes up multiple times during the night and pulls off his mask.  He has restless sleep wakes up, gasping for air, has daytime sleepiness,  wakes up unrefreshed.  Gets up several times throughout the night. Snoring .  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 Does not operate heavy machinery,  Epworth score 15 .  Works  3 rd shift -11 pm to 7 am . Goes to bed 9 am , gets up 3 pm . Falls asleep quickly . Weight is up 10 lbs over last 2 yrs . Current weight is 241 lb, BMI 32 .  Drinks 1 cup of coffee each night at work.  No energy drinks or decongestants .  No TV at bedtime.  No cataplexy , sleep walking or sleep paralysis . No Teeth grinding .  Smokes 1 PPD. Has LDCT Chest coming up . No known history of COPD or Asthma  Discussed smoking cessation .  Previously tried nasal and full face but did not tolerate either.  Has not used CPAP in last 2 yrs. Machine is only 52 yrs old . Wants to wear so he can feel better.      No Known Allergies  Immunization History  Administered Date(s) Administered   Influenza,inj,Quad PF,6+ Mos 09/21/2017, 09/23/2017   Influenza-Unspecified 10/18/2016   Moderna SARS-COV2 Booster  Vaccination 01/26/2020   Moderna Sars-Covid-2 Vaccination 05/24/2019, 06/21/2019   Pneumococcal Polysaccharide-23 04/01/2019   Tdap 04/22/2017    Past Medical History:  Diagnosis Date   Arthritis    BPH (benign prostatic hyperplasia)    Diabetes ([place])    Dysuria    GERD (gastroesophageal reflux disease)    History of kidney stones    HTN (hypertension)    Hyperlipidemia    Impotence    Nocturia    Obesity    Pneumonia age 1's   Sleep apnea    CPAP - severe (sleep study 01/11/16)   Testicular hypofunction    Wears dentures    full upper and lower    Tobacco History: Social History   Tobacco Use  Smoking Status Every Day   Packs/day: 0.50   Years: 20.00   Pack years: 10.00   Types: Cigarettes  Smokeless Tobacco Never   Ready to quit: No Counseling given: Yes  Engaged. Lives with Fiance , Mother and Step son . Smokes 1 PPD .   FH : + COPD and Emphysema , Heart Disease   SH : Gallbladder surgery 2015    Outpatient Medications Prior to Visit  Medication Sig Dispense Refill   alprostadil (MUSE) 1000 MCG pellet 1 each (1,000 mcg total) by  Transurethral route as needed for erectile dysfunction. use no more than 3 times per week 6 each 12   amLODipine (NORVASC) 10 MG tablet Take 1 tablet (10 mg total) by mouth daily. 90 tablet 3   atorvastatin (LIPITOR) 80 MG tablet Take 1 tablet (80 mg total) by mouth daily at 6 PM. 90 tablet 1   Blood Glucose Monitoring Suppl (ONETOUCH VERIO) w/Device KIT 1 Device by Does not apply route daily. 1 kit 0   ezetimibe (ZETIA) 10 MG tablet Take 1 tablet (10 mg total) by mouth daily. 90 tablet 3   folic acid (FOLVITE) 1 MG tablet Take 1 tablet (1 mg total) by mouth daily. 90 tablet 3   gabapentin (NEURONTIN) 100 MG capsule TAKE 1 CAPSULE THREE TIMES A DAY 270 capsule 3   glucose blood test strip Use to check blood sugar up to 3 times daily 300 each 3   hydrochlorothiazide (MICROZIDE) 12.5 MG capsule TAKE 1 CAPSULE DAILY 90 capsule 3    Insulin Glargine-yfgn (SEMGLEE, YFGN,) 100 UNIT/ML SOLN Inject 48 Units into the skin daily. 10 mL 3   losartan (COZAAR) 100 MG tablet Take 1 tablet (100 mg total) by mouth daily. 90 tablet 1   metFORMIN (GLUCOPHAGE-XR) 500 MG 24 hr tablet Take 4 tablets (2,000 mg total) by mouth every evening. 360 tablet 3   mirabegron ER (MYRBETRIQ) 25 MG TB24 tablet Take 1 tablet (25 mg total) by mouth daily. 28 tablet 0   omeprazole (PRILOSEC) 20 MG capsule Take 1 capsule (20 mg total) by mouth daily. 90 capsule 3   Semaglutide, 1 MG/DOSE, (OZEMPIC, 1 MG/DOSE,) 2 MG/1.5ML SOPN Inject 1 mg into the skin once a week. 3 mL 3   tadalafil (CIALIS) 20 MG tablet TAKE 1 TABLET BY MOUTH ONCE DAILY AS NEEDED FOR  ERECTILE  DYSFUNCTION  -  EFFECTS  MAY  LAST  UP  TO  36  HRS 30 tablet 0   tadalafil (CIALIS) 5 MG tablet Take 1 tablet (5 mg total) by mouth daily as needed for erectile dysfunction. 90 tablet 3   No facility-administered medications prior to visit.     Review of Systems:   Constitutional:   No  weight loss, night sweats,  Fevers, chills,  +fatigue, or  lassitude.  HEENT:   No headaches,  Difficulty swallowing,  Tooth/dental problems, or  Sore throat,                No sneezing, itching, ear ache, nasal congestion, post nasal drip,   CV:  No chest pain,  Orthopnea, PND, swelling in lower extremities, anasarca, dizziness, palpitations, syncope.   GI  No heartburn, indigestion, abdominal pain, nausea, vomiting, diarrhea, change in bowel habits, loss of appetite, bloody stools.   Resp: No shortness of breath with exertion or at rest.  No excess mucus, no productive cough,  No non-productive cough,  No coughing up of blood.  No change in color of mucus.  No wheezing.  No chest wall deformity  Skin: no rash or lesions.  GU: no dysuria, change in color of urine, no urgency or frequency.  No flank pain, no hematuria   MS:  No joint pain or swelling.  No decreased range of motion.  No back  pain.    Physical Exam  BP (!) 140/98 (BP Location: Left Arm, Patient Position: Sitting, Cuff Size: Normal)   Pulse 95   Temp 97.9 F (36.6 C) (Oral)   Ht 6' (1.829 m)   [facility]  241 lb (109.3 kg)   SpO2 97%   BMI 32.69 kg/m   GEN: A/Ox3; pleasant , NAD, BMI 32    HEENT:  [place]/AT,  EACs-clear, TMs-wnl, NOSE-clear, THROAT-clear, no lesions, no postnasal drip or exudate noted. Class 3 MP airway , upper and lower dentures   NECK:  Supple w/ fair ROM; no JVD; normal carotid impulses w/o bruits; no thyromegaly or nodules palpated; no lymphadenopathy.    RESP  Clear  P & A; w/o, wheezes/ rales/ or rhonchi. no accessory muscle use, no dullness to percussion  CARD:  RRR, no m/r/g, no peripheral edema, pulses intact, no cyanosis or clubbing.  GI:   Soft & nt; nml bowel sounds; no organomegaly or masses detected.   Musco: Warm bil, no deformities or joint swelling noted.   Neuro: alert, no focal deficits noted.    Skin: Warm, no lesions or rashes    Lab Results:    BMET   BNP No results found for: BNP  ProBNP No results found for: PROBNP  Imaging:     No flowsheet data found.  No results found for: NITRICOXIDE      Assessment & Plan:   OSA (obstructive sleep apnea) Known obstructive sleep apnea-sleep study results from 2018 have been requested.  Patient reports that they were severe.  Patient has multiple comorbidities including diabetes, hypertension, hyperlipidemia.  We discussed the cardiovascular risk factors of obstructive sleep apnea and importance of treatment.  We discussed treatment options including weight loss, CPAP.  If patient is unable to tolerate CPAP could consider a referral for the inspire device. For nail we will need to set patient up for a CPAP titration study.  He has been unable to tolerate CPAP and tried multiple times over the last 4 years. Patient education on healthy sleep.  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  We discussed helpful hints to to combat shift work.  Plan  Patient Instructions  Sleep study results .  Healthy sleep regimen .  Set up for CPAP titration study .  Activity as tolerated.  Do not drive if sleepy .  Work on not smoking  LDCT chest program as planned.  Follow up in 2-3 months with Dr. Mortimer Fries or Aunisty Reali NP      Tobacco abuse Smoking cessation discussed    I spent   45 minutes dedicated to the care of this patient on the date of this encounter to include pre-visit review of records, face-to-face time with the patient discussing conditions above, post visit ordering of testing, clinical documentation with the electronic health record, making appropriate referrals as documented, and communicating necessary findings to members of the patients care team.    Rexene Edison, NP 09/18/2020

## 2020-09-18 NOTE — Assessment & Plan Note (Signed)
Smoking cessation discussed 

## 2020-09-18 NOTE — Assessment & Plan Note (Addendum)
Known obstructive sleep apnea-sleep study results from 2018 have been requested.  Patient reports that they were severe.  Patient has multiple comorbidities including diabetes, hypertension, hyperlipidemia.  We discussed the cardiovascular risk factors of obstructive sleep apnea and importance of treatment.  We discussed treatment options including weight loss, CPAP.  If patient is unable to tolerate CPAP could consider a referral for the inspire device. For nail we will need to set patient up for a CPAP titration study.  He has been unable to tolerate CPAP and tried multiple times over the last 4 years. Patient education on healthy sleep.  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  We discussed helpful hints to to combat shift work.  Late add Sleep study December 07, 2015 showed severe obstructive sleep apnea with AHI at 64.5/h.  Optimal control at CPAP at 10 cm H2O.  SPO2 low at 78%.  Plan  Patient Instructions  Sleep study results .  Healthy sleep regimen .  Set up for CPAP titration study .  Activity as tolerated.  Do not drive if sleepy .  Work on not smoking  LDCT chest program as planned.  Follow up in 2-3 months with Dr. Mortimer Fries or Sophiah Rolin NP

## 2020-09-18 NOTE — Progress Notes (Signed)
Reviewed and agree with assessment/plan.   Chesley Mires, MD [facility] Pulmonary/Critical Care 09/18/2020, 12:54 PM Pager:  [phone]

## 2020-09-18 NOTE — Patient Instructions (Signed)
Sleep study results .  Healthy sleep regimen .  Set up for CPAP titration study .  Activity as tolerated.  Do not drive if sleepy .  Work on not smoking  LDCT chest program as planned.  Follow up in 2-3 months with Dr. Mortimer Fries or Emile Ringgenberg NP

## 2020-09-18 NOTE — Telephone Encounter (Signed)
Sleep study has been received and given to TP for review. Nothing further needed at this time.

## 2020-09-20 ENCOUNTER — Ambulatory Visit
Admission: RE | Admit: 2020-09-20 | Discharge: 2020-09-20 | Disposition: A | Discharge status: Home / Self Care | Payer: Commercial Managed Care - PPO | Attending: Urology | Admitting: Urology

## 2020-09-20 ENCOUNTER — Encounter
Admission: RE | Disposition: A | Discharge status: Home / Self Care | Payer: Self-pay | Source: Home / Self Care | Attending: Urology

## 2020-09-20 ENCOUNTER — Other Ambulatory Visit: Payer: Self-pay | Admitting: Urology

## 2020-09-20 ENCOUNTER — Encounter: Payer: Self-pay | Admitting: Urology

## 2020-09-20 DIAGNOSIS — F17210 Nicotine dependence, cigarettes, uncomplicated: Secondary | ICD-10-CM | POA: Insufficient documentation

## 2020-09-20 DIAGNOSIS — Z87442 Personal history of urinary calculi: Secondary | ICD-10-CM | POA: Diagnosis not present

## 2020-09-20 DIAGNOSIS — Z800 Family history of malignant neoplasm of digestive organs: Secondary | ICD-10-CM | POA: Insufficient documentation

## 2020-09-20 DIAGNOSIS — E669 Obesity, unspecified: Secondary | ICD-10-CM | POA: Diagnosis not present

## 2020-09-20 DIAGNOSIS — Z6832 Body mass index (BMI) 32.0-32.9, adult: Secondary | ICD-10-CM | POA: Diagnosis not present

## 2020-09-20 DIAGNOSIS — Z7984 Long term (current) use of oral hypoglycemic drugs: Secondary | ICD-10-CM | POA: Diagnosis not present

## 2020-09-20 DIAGNOSIS — Z8379 Family history of other diseases of the digestive system: Secondary | ICD-10-CM | POA: Insufficient documentation

## 2020-09-20 DIAGNOSIS — E291 Testicular hypofunction: Secondary | ICD-10-CM | POA: Insufficient documentation

## 2020-09-20 DIAGNOSIS — N401 Enlarged prostate with lower urinary tract symptoms: Secondary | ICD-10-CM | POA: Diagnosis not present

## 2020-09-20 DIAGNOSIS — E785 Hyperlipidemia, unspecified: Secondary | ICD-10-CM | POA: Insufficient documentation

## 2020-09-20 DIAGNOSIS — N200 Calculus of kidney: Secondary | ICD-10-CM | POA: Insufficient documentation

## 2020-09-20 DIAGNOSIS — Z833 Family history of diabetes mellitus: Secondary | ICD-10-CM | POA: Insufficient documentation

## 2020-09-20 DIAGNOSIS — Z79899 Other long term (current) drug therapy: Secondary | ICD-10-CM | POA: Insufficient documentation

## 2020-09-20 DIAGNOSIS — K219 Gastro-esophageal reflux disease without esophagitis: Secondary | ICD-10-CM | POA: Diagnosis not present

## 2020-09-20 DIAGNOSIS — G4730 Sleep apnea, unspecified: Secondary | ICD-10-CM | POA: Diagnosis not present

## 2020-09-20 DIAGNOSIS — Z8261 Family history of arthritis: Secondary | ICD-10-CM | POA: Insufficient documentation

## 2020-09-20 DIAGNOSIS — E119 Type 2 diabetes mellitus without complications: Secondary | ICD-10-CM | POA: Diagnosis not present

## 2020-09-20 DIAGNOSIS — I10 Essential (primary) hypertension: Secondary | ICD-10-CM | POA: Insufficient documentation

## 2020-09-20 DIAGNOSIS — Z820 Family history of epilepsy and other diseases of the nervous system: Secondary | ICD-10-CM | POA: Insufficient documentation

## 2020-09-20 DIAGNOSIS — N529 Male erectile dysfunction, unspecified: Secondary | ICD-10-CM

## 2020-09-20 HISTORY — PX: EXTRACORPOREAL SHOCK WAVE LITHOTRIPSY: SHX1557

## 2020-09-20 SURGERY — LITHOTRIPSY, ESWL
Anesthesia: Moderate Sedation | Laterality: Left

## 2020-09-20 MED ORDER — HYDROCODONE-ACETAMINOPHEN 5-325 MG PO TABS: 1.0000 | ORAL_TABLET | Freq: Four times a day (QID) | ORAL | 0 refills | Status: DC | PRN

## 2020-09-20 MED ORDER — SODIUM CHLORIDE 0.9 % IV SOLN: INTRAVENOUS | Status: DC

## 2020-09-20 MED ORDER — TAMSULOSIN HCL 0.4 MG PO CAPS: 0.4000 mg | ORAL_CAPSULE | Freq: Every day | ORAL | 0 refills | Status: DC

## 2020-09-20 MED ORDER — CEPHALEXIN 250 MG PO CAPS: 500.0000 mg | ORAL_CAPSULE | ORAL | Status: AC

## 2020-09-20 NOTE — Interval H&P Note (Signed)
History and Physical Interval Note:  09/20/2020 8:49 AM  Ronald Pruitt.  has presented today for surgery, with the diagnosis of Left Renal stone.  The various methods of treatment have been discussed with the patient and family. After consideration of risks, benefits and other options for treatment, the patient has consented to  Procedure(s): EXTRACORPOREAL SHOCK WAVE LITHOTRIPSY (ESWL) (Left) as a surgical intervention.  The patient's history has been reviewed, patient examined, no change in status, stable for surgery.  I have reviewed the patient's chart and labs.  Questions were answered to the patient's satisfaction.     Hollice Espy   RRR CTAB

## 2020-09-20 NOTE — Discharge Instructions (Addendum)
See [facility] discharge instructions in chart. AMBULATORY SURGERY  DISCHARGE INSTRUCTIONS   The drugs that you were given will stay in your system until tomorrow so for the next 24 hours you should not:  Drive an automobile Make any legal decisions Drink any alcoholic beverage   You may resume regular meals tomorrow.  Today it is better to start with liquids and gradually work up to solid foods.  You may eat anything you prefer, but it is better to start with liquids, then soup and crackers, and gradually work up to solid foods.   Please notify your doctor immediately if you have any unusual bleeding, trouble breathing, redness and pain at the surgery site, drainage, fever, or pain not relieved by medication.    Additional Instructions:        Please contact your physician with any problems or Same Day Surgery at [phone], Monday through Friday 6 am to 4 pm, or [place] [facility] [place] Main number at [phone].  

## 2020-09-21 ENCOUNTER — Encounter: Payer: Self-pay | Admitting: Family

## 2020-09-21 IMAGING — CR DG ABDOMEN 1V
1 series · 2 of 2 positions shown · non-contrast
Comparison: April 22, 2017.

CLINICAL DATA: Left renal stone.  Pre lithotripsy.

EXAM:
ABDOMEN - 1 VIEW

[Series 1: dg abd 1 view · 0.14mm/px · 2 of 2 slices shown]
[im 1/2]
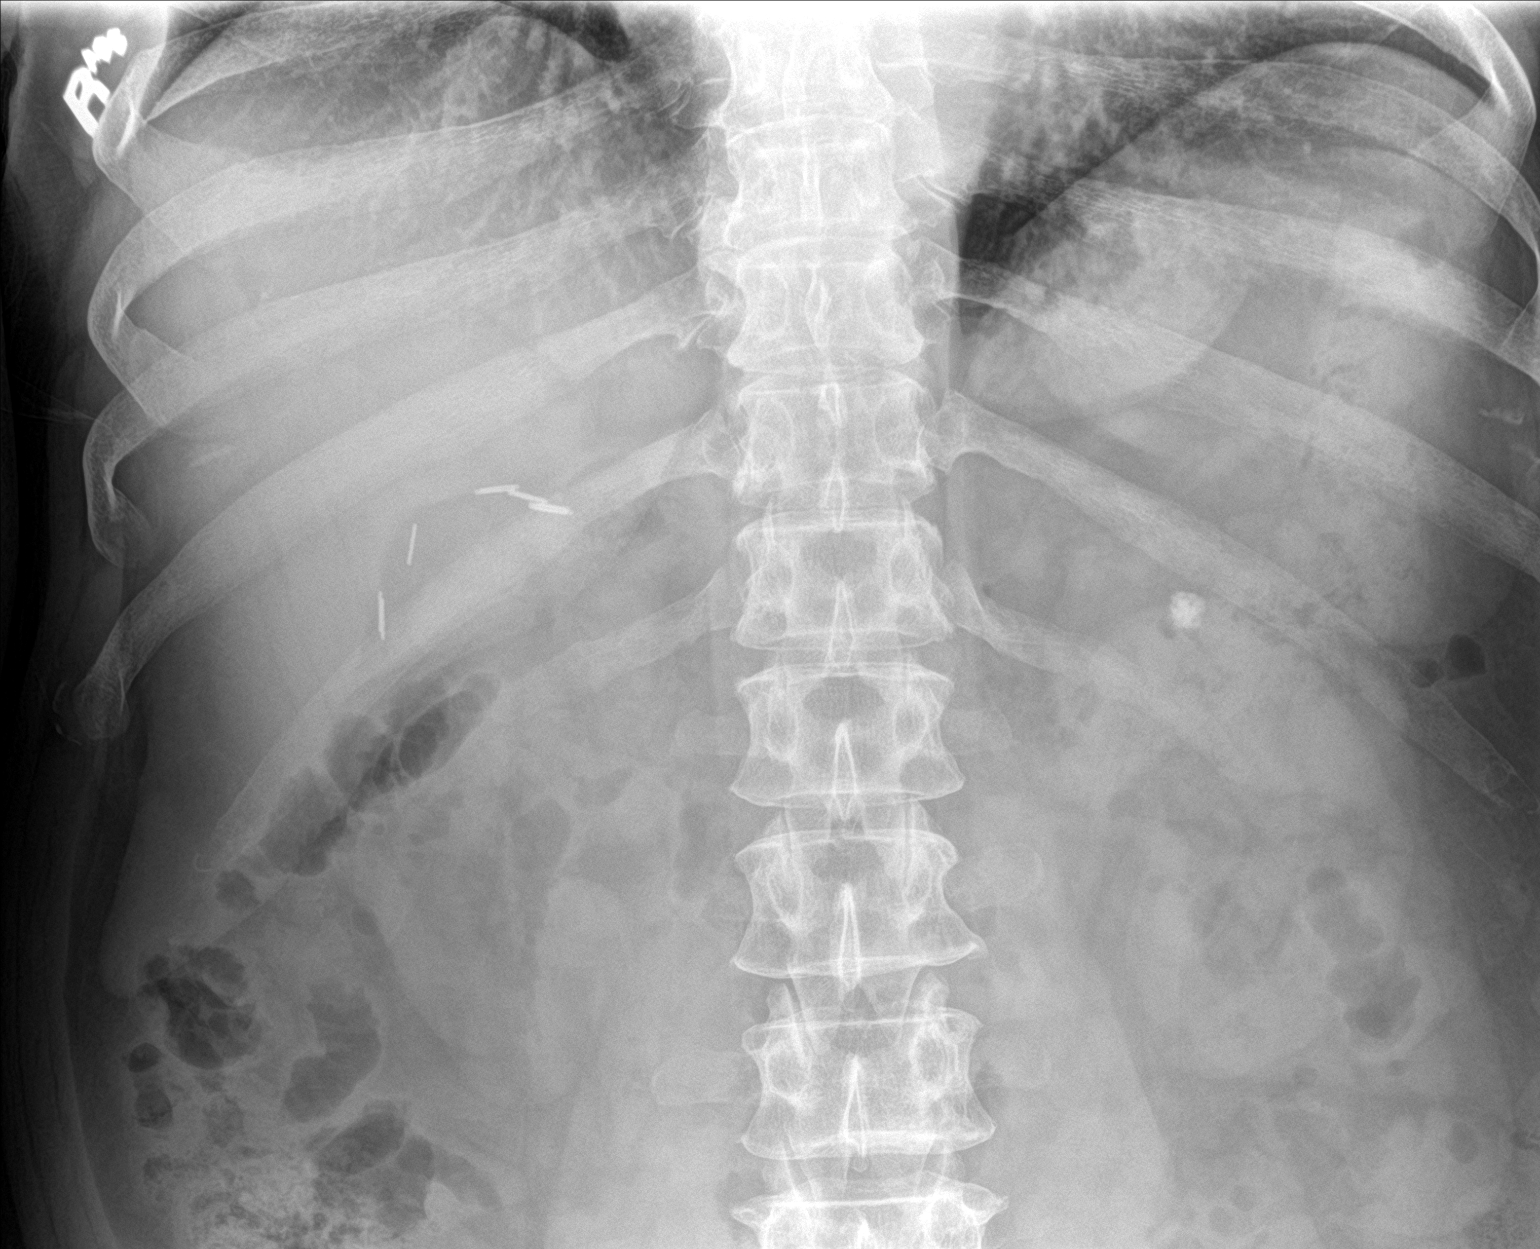
[im 2/2]
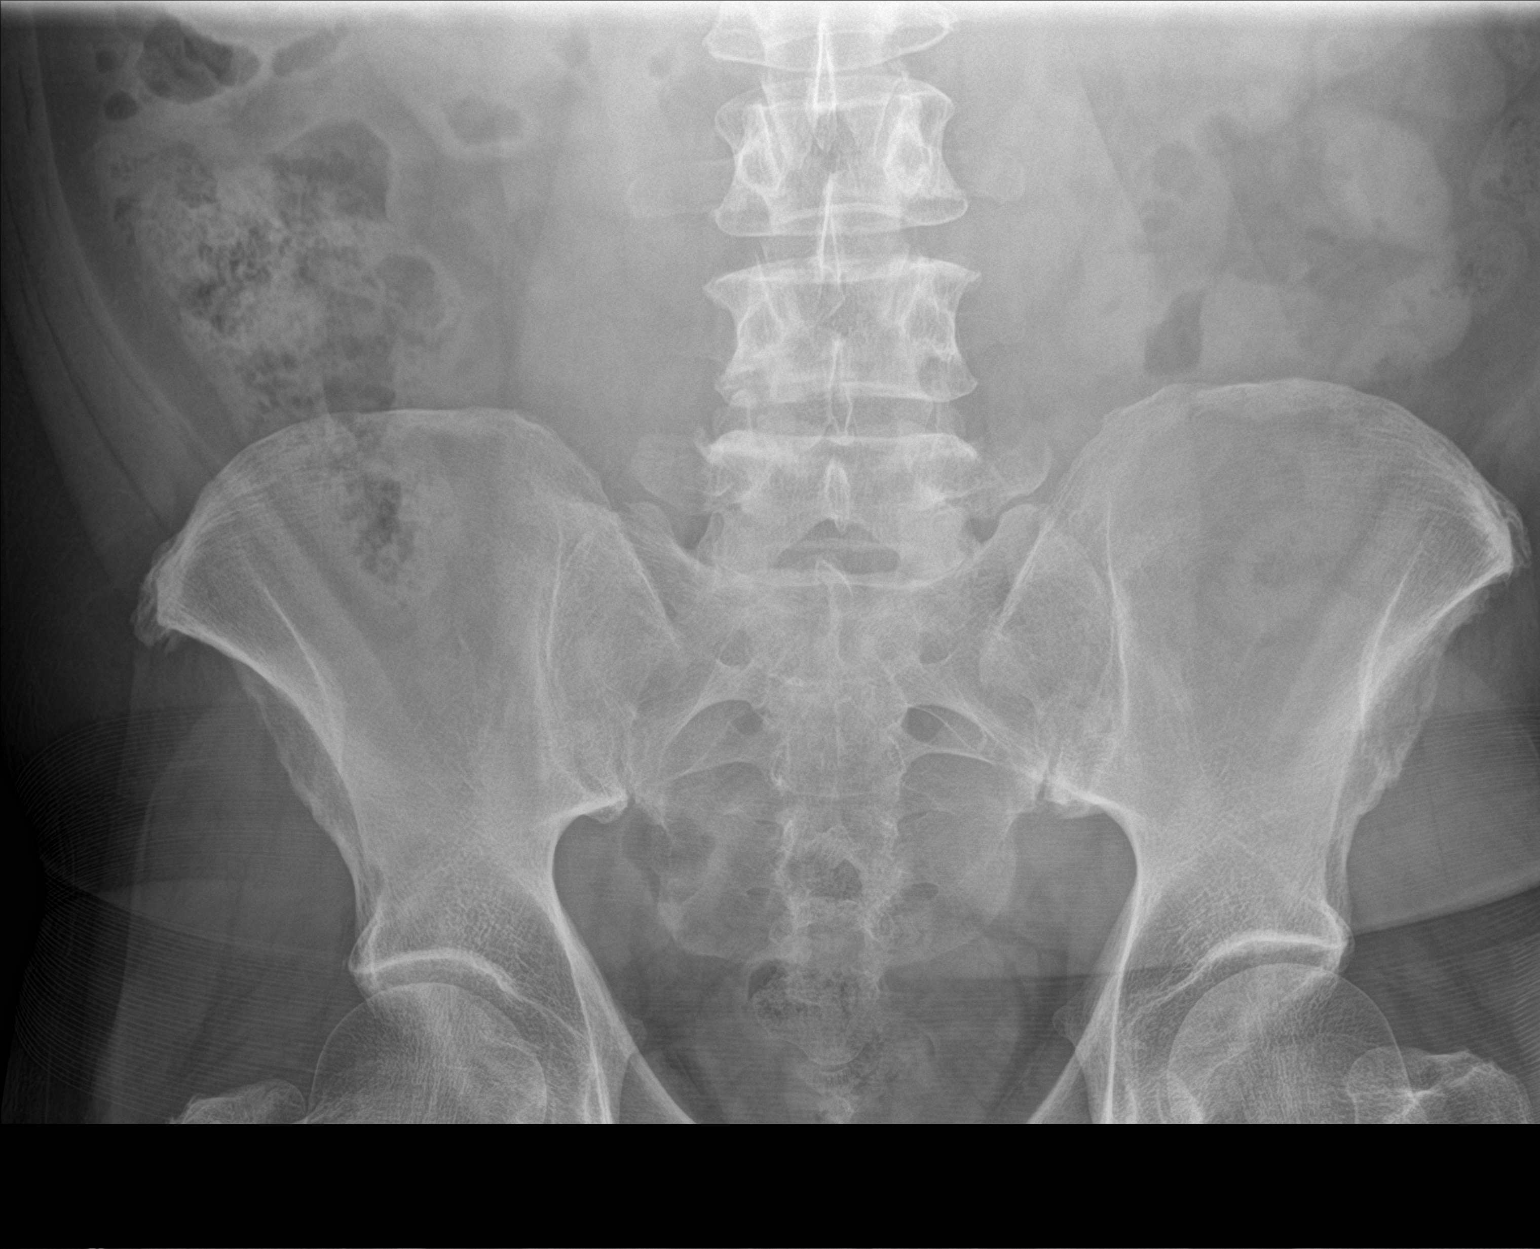

[2 of 2 positions shown; findings below may reference images not displayed]

FINDINGS: The bowel gas pattern is normal. Status post cholecystectomy. Stable
large calculus is seen projected over upper pole of left kidney.
IMPRESSION: Stable large left renal calculus.

## 2020-09-24 ENCOUNTER — Ambulatory Visit: Admission: RE | Admit: 2020-09-24 | Payer: Commercial Managed Care - PPO | Source: Ambulatory Visit

## 2020-09-29 ENCOUNTER — Other Ambulatory Visit: Payer: Self-pay | Admitting: Family

## 2020-09-29 DIAGNOSIS — E1140 Type 2 diabetes mellitus with diabetic neuropathy, unspecified: Secondary | ICD-10-CM

## 2020-10-09 ENCOUNTER — Other Ambulatory Visit: Payer: Self-pay | Admitting: Family

## 2020-10-09 DIAGNOSIS — N529 Male erectile dysfunction, unspecified: Secondary | ICD-10-CM

## 2020-10-13 IMAGING — CR DG ABDOMEN 1V
1 series · 3 of 3 positions shown · non-contrast
Comparison: 09/29/2019

CLINICAL DATA: Kidney stones. Recent stones. Patient cannot
remember which side. Pain in the LOWER back.

EXAM:
ABDOMEN - 1 VIEW

[Series 1: dg abd 1 view · 0.14mm/px · 3 of 3 slices shown]
[im 1/3]
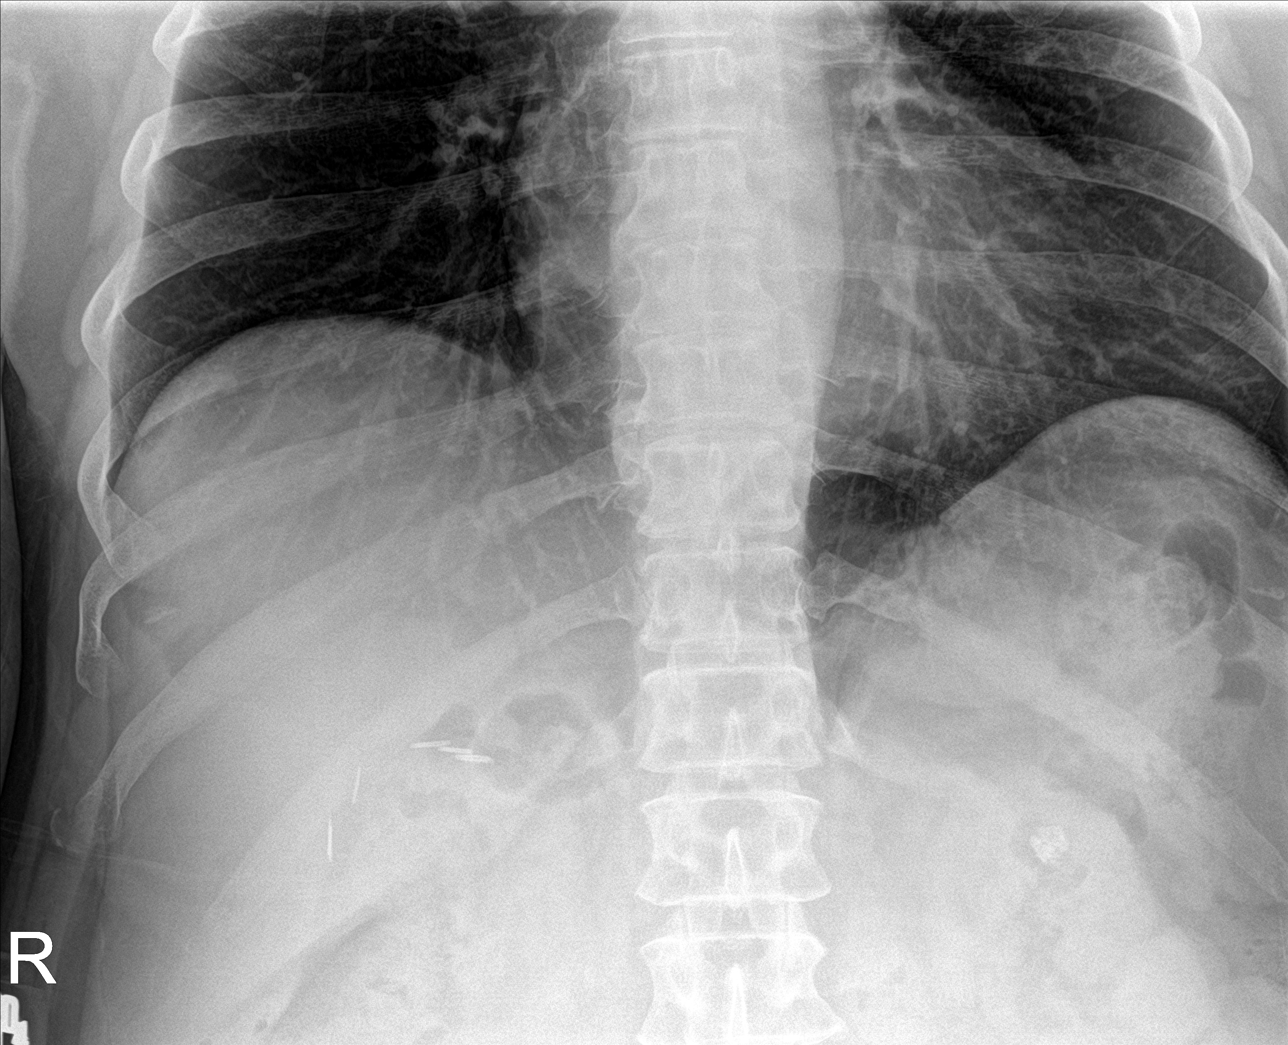
[im 2/3]
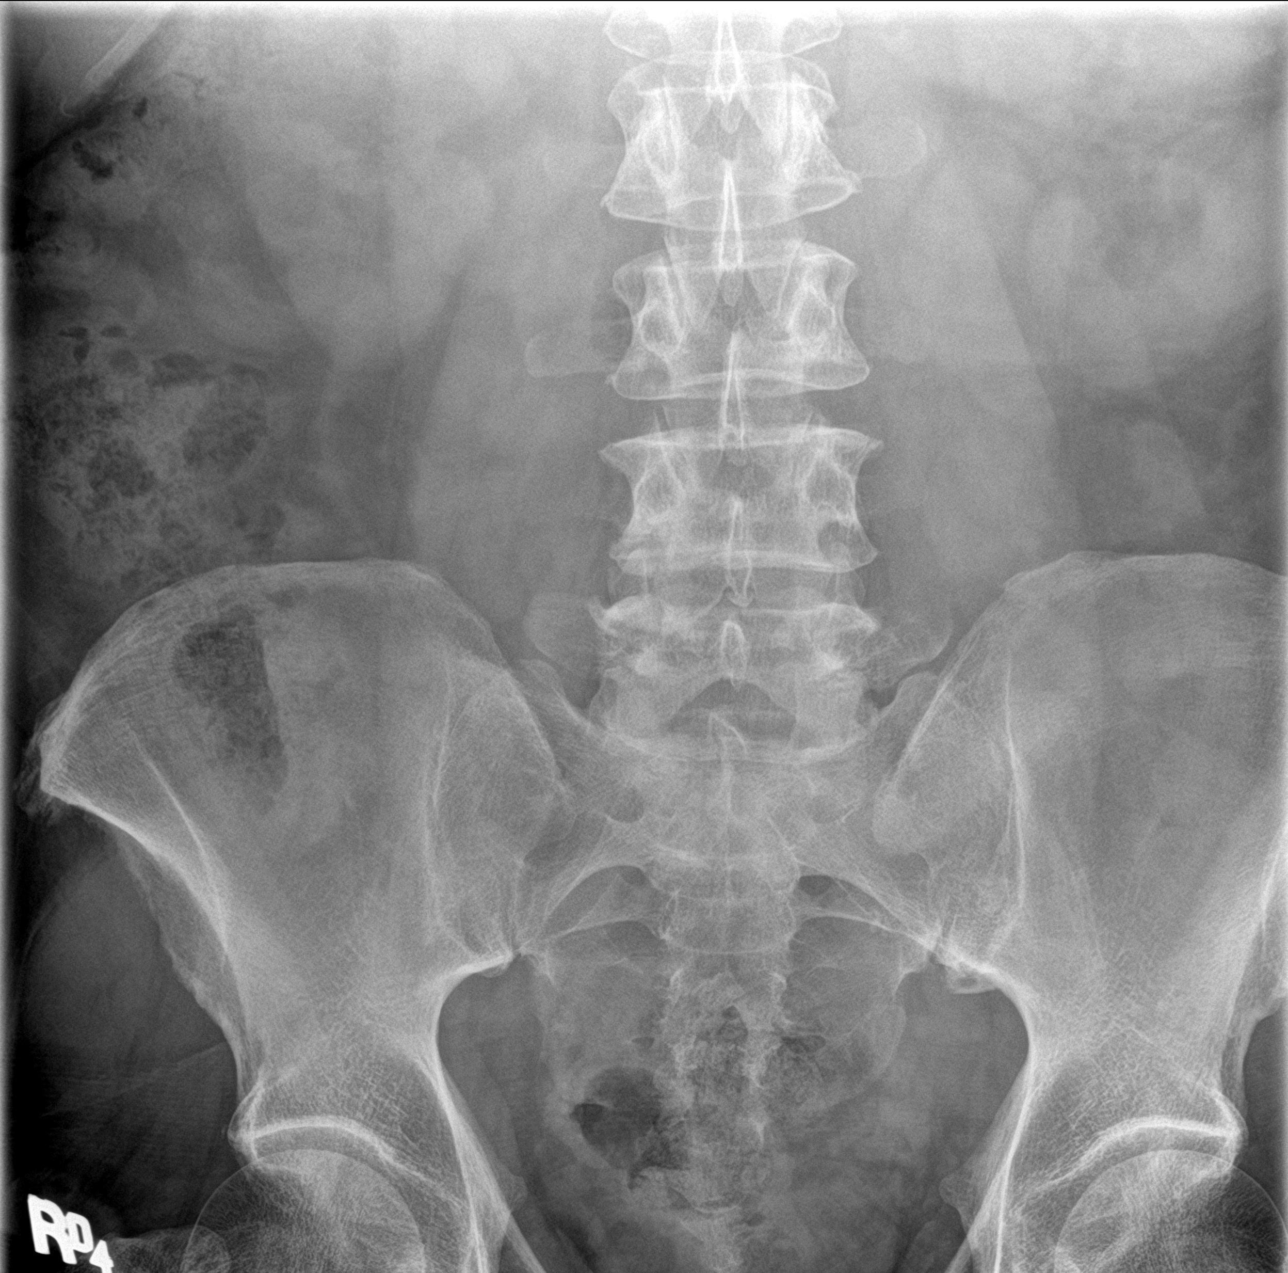
[im 3/3]
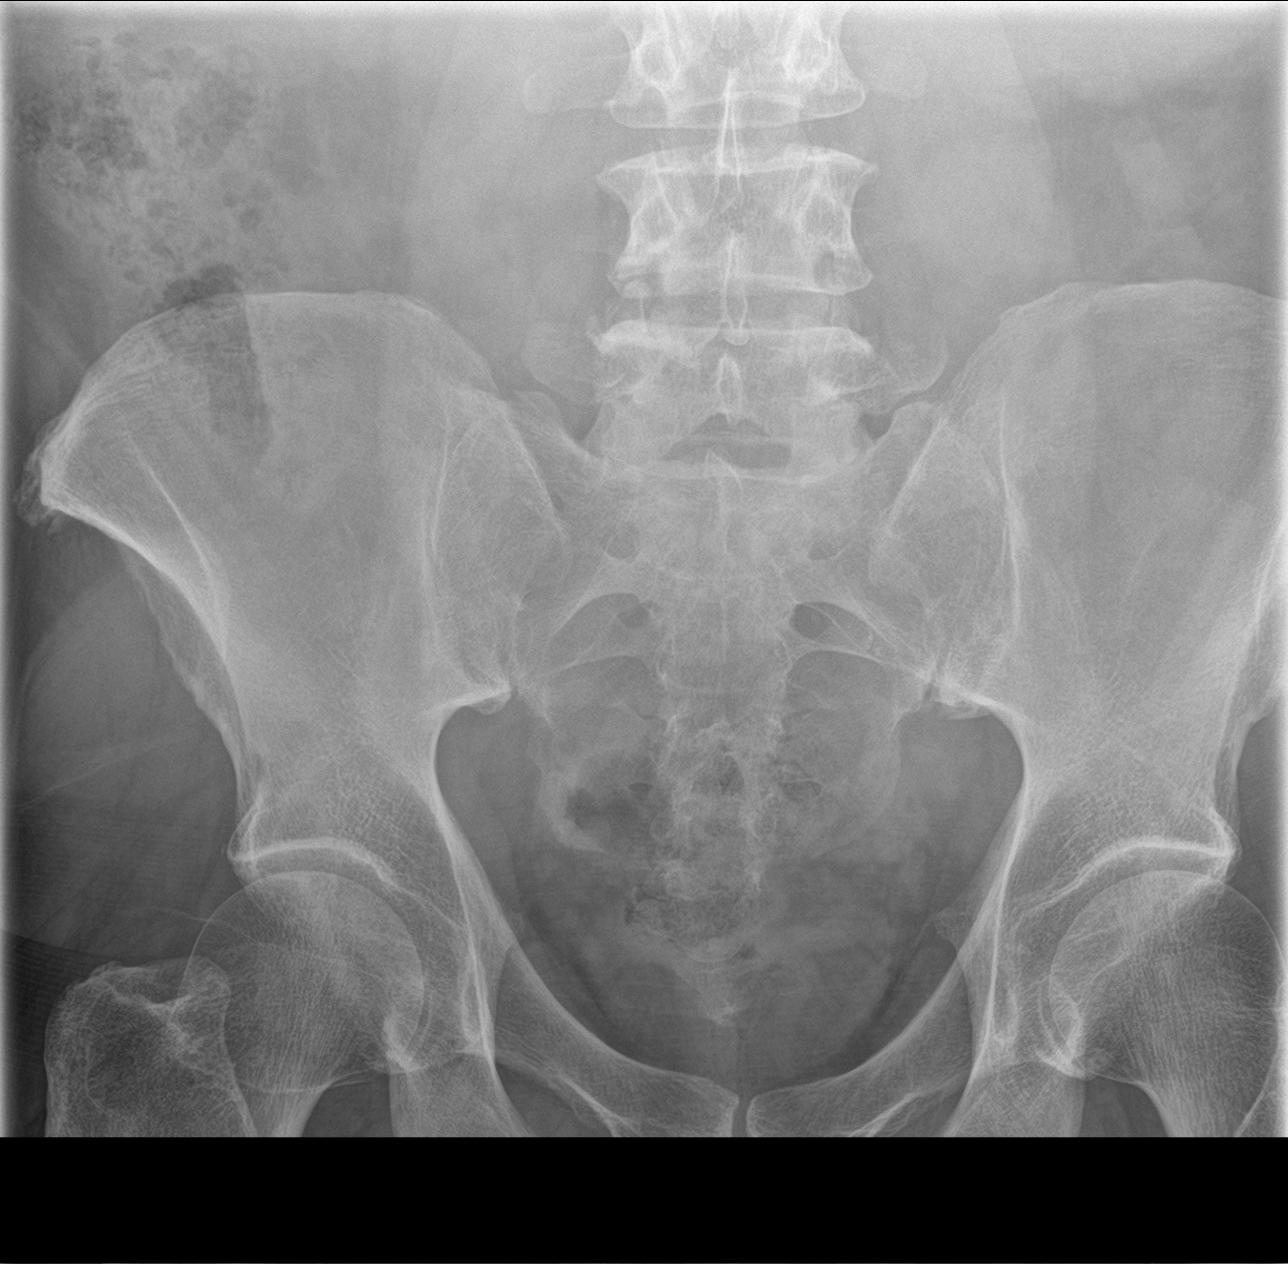

[3 of 3 positions shown; findings below may reference images not displayed]

FINDINGS: Surgical clips are seen in the RIGHT UPPER QUADRANT of the abdomen.
Calcification measuring 1.2 centimeter overlies the region of the
UPPER pole of the LEFT kidney and appear stable. Region of the RIGHT
kidney is unremarkable. No evidence for ureteral stones.
IMPRESSION: 1. Stable appearance of LEFT intrarenal calculus.
2. No evidence for ureteral stones.

## 2020-10-17 ENCOUNTER — Other Ambulatory Visit: Payer: Self-pay

## 2020-10-17 ENCOUNTER — Other Ambulatory Visit
Admission: RE | Admit: 2020-10-17 | Discharge: 2020-10-17 | Disposition: A | Discharge status: Home / Self Care | Payer: Commercial Managed Care - PPO | Source: Ambulatory Visit | Attending: Adult Health | Admitting: Adult Health

## 2020-10-17 DIAGNOSIS — Z01812 Encounter for preprocedural laboratory examination: Secondary | ICD-10-CM | POA: Diagnosis not present

## 2020-10-17 DIAGNOSIS — Z20822 Contact with and (suspected) exposure to covid-19: Secondary | ICD-10-CM | POA: Insufficient documentation

## 2020-10-17 LAB — SARS CORONAVIRUS 2 (TAT 6-24 HRS): SARS Coronavirus 2: NEGATIVE

## 2020-10-17 NOTE — Progress Notes (Signed)
10/22/20 4:47 PM   Ronald W Ferrari Jr. 06/23/1970 [id number]  Referring provider:  Arnett, Margaret G, FNP [address] [place],   [place] [address]  Chief Complaint  Patient presents with   Nephrolithiasis    Urological history  Testosterone deficiency  -contributing factors of age, diabetes and obesity -Testosterone level normalized without treatment   ED  - -contributing factors of age, BPH, testosterone deficiency, DM, HTN, HLD, sleep apnea, and stress -Managed with tadalafil 20 mg, on-demand dosing  3. BPH with LUTS  - PSA 0.3 in 08/2020 -managed with tadalafil 5 mg daily  4. Nephrolithiasis  -Stone composition 80% calcium oxalate monohydrate and 20% calcium oxalate dihydrate - S/p ESWL left renal stone 2021 - S/p ESWL left renal stone 09/20/2020  HPI: Ronald W Deangelo Jr. is a 50 y.o.male, who presents today for 4 week pos-op follow-up with KUB prior.    KUB today revealed that left renal stone is still present but it look well pulverized  because it is in several fragments   He reports today that he has not seemed to have passed any stones. He has passed what sounds like debris to which he says breaks apart when touched.   He is undergoing a sleep study tomorrow to see why he removes his CPAP at night.   He did not fill the MUSE that was prescribed for him at his previous visit.  He is wanting a refill on his tadalafil 20 mg, on-demand-dosing  PMH: Past Medical History:  Diagnosis Date   Arthritis    BPH (benign prostatic hyperplasia)    Diabetes ([facility])    Dysuria    GERD (gastroesophageal reflux disease)    History of kidney stones    HTN (hypertension)    Hyperlipidemia    Impotence    Nocturia    Obesity    Pneumonia age 18's   Sleep apnea    CPAP - severe (sleep study 01/11/16)   Testicular hypofunction    Wears dentures    full upper and lower    Surgical History: Past Surgical History:  Procedure Laterality Date    CHOLECYSTECTOMY     COLONOSCOPY WITH PROPOFOL N/A 10/12/2018   Procedure: COLONOSCOPY WITH PROPOFOL;  Surgeon: Wohl, Darren, MD;  Location: [facility] ENDOSCOPY;  Service: Endoscopy;  Laterality: N/A;   EXTRACORPOREAL SHOCK WAVE LITHOTRIPSY Left 09/29/2019   Procedure: EXTRACORPOREAL SHOCK WAVE LITHOTRIPSY (ESWL);  Surgeon: Brandon, Ashley, MD;  Location: [facility] ORS;  Service: Urology;  Laterality: Left;   EXTRACORPOREAL SHOCK WAVE LITHOTRIPSY Left 09/20/2020   Procedure: EXTRACORPOREAL SHOCK WAVE LITHOTRIPSY (ESWL);  Surgeon: Brandon, Ashley, MD;  Location: [facility] ORS;  Service: Urology;  Laterality: Left;   NASAL SEPTOPLASTY W/ TURBINOPLASTY N/A 05/20/2017   Procedure: NASAL SEPTOPLASTY WITH SUBMUCOUS RESECTION;  Surgeon: Bennett, Paul, MD;  Location: [facility] ORS;  Service: ENT;  Laterality: N/A;   VASECTOMY      Home Medications:  Allergies as of 10/18/2020   No Known Allergies      Medication List        Accurate as of October 18, 2020 11:59 PM. If you have any questions, ask your nurse or doctor.          STOP taking these medications    mirabegron ER 25 MG Tb24 tablet Commonly known as: MYRBETRIQ Stopped by: SHANNON MCGOWAN, PA-C   tamsulosin 0.4 MG Caps capsule Commonly known as: Flomax Stopped by: SHANNON MCGOWAN, PA-C       TAKE these medications      alprostadil 1000 MCG pellet Commonly known as: Muse 1 each (1,000 mcg total) by Transurethral route as needed for erectile dysfunction. use no more than 3 times per week   amLODipine 10 MG tablet Commonly known as: NORVASC Take 1 tablet (10 mg total) by mouth daily.   atorvastatin 80 MG tablet Commonly known as: LIPITOR Take 1 tablet (80 mg total) by mouth daily at 6 PM.   ezetimibe 10 MG tablet Commonly known as: ZETIA Take 1 tablet (10 mg total) by mouth daily.   folic acid 1 MG tablet Commonly known as: FOLVITE Take 1 tablet (1 mg total) by mouth daily.   gabapentin 100 MG capsule Commonly known as:  NEURONTIN TAKE 1 CAPSULE THREE TIMES A DAY   glucose blood test strip Use to check blood sugar up to 3 times daily   hydrochlorothiazide 12.5 MG capsule Commonly known as: MICROZIDE TAKE 1 CAPSULE DAILY   HYDROcodone-acetaminophen 5-325 MG tablet Commonly known as: NORCO/VICODIN Take 1-2 tablets by mouth every 6 (six) hours as needed for moderate pain.   insulin glargine-yfgn 100 UNIT/ML injection Commonly known as: Semglee (yfgn) Inject 48 Units into the skin daily.   losartan 100 MG tablet Commonly known as: COZAAR Take 1 tablet (100 mg total) by mouth daily.   metFORMIN 500 MG 24 hr tablet Commonly known as: GLUCOPHAGE-XR Take 4 tablets (2,000 mg total) by mouth every evening.   omeprazole 20 MG capsule Commonly known as: PRILOSEC Take 1 capsule (20 mg total) by mouth daily.   OneTouch Verio w/Device Kit 1 Device by Does not apply route daily.   Ozempic (1 MG/DOSE) 4 MG/3ML Sopn Generic drug: Semaglutide (1 MG/DOSE) INJECT 1 MG UNDER THE SKIN ONCE A WEEK   tadalafil 5 MG tablet Commonly known as: CIALIS Take 1 tablet (5 mg total) by mouth daily as needed for erectile dysfunction.   tadalafil 20 MG tablet Commonly known as: CIALIS TAKE 1 TABLET BY MOUTH ONCE DAILY AS NEEDED FOR  ERECTILE  DYSFUNCTION  -  EFFECTS  MAY  LAST  UP  TO  36  HRS        Allergies:  No Known Allergies  Family History: Family History  Problem Relation Age of Onset   Hyperlipidemia Mother    Diabetes Mellitus II Mother    Seizures Mother    Diabetes Mother    Mental illness Mother    Arthritis Mother    Diabetes Father    Cirrhosis Father        non alcoholic   Hyperlipidemia Father    Arthritis Father    Liver cancer Father    Diabetes Maternal Grandmother    Hyperlipidemia Maternal Grandmother    Arthritis Maternal Grandmother    Diabetes Maternal Grandfather    Hyperlipidemia Maternal Grandfather    Arthritis Maternal Grandfather    Diabetes Paternal Grandmother     Hyperlipidemia Paternal Grandmother    Arthritis Paternal Grandmother    Diabetes Paternal Grandfather    Hyperlipidemia Paternal Grandfather    Arthritis Paternal Grandfather    Kidney disease Neg Hx    Prostate cancer Neg Hx    Kidney cancer Neg Hx    Bladder Cancer Neg Hx    Colon cancer Neg Hx    Thyroid cancer Neg Hx     Social History:  reports that he has been smoking cigarettes. He has a 10.00 pack-year smoking history. He has never used smokeless tobacco. He reports that he does not drink alcohol and  does not use drugs.   Physical Exam: BP (!) 166/121   Pulse 85   Ht 6' (1.829 m)   Wt 241 lb (109.3 kg)   BMI 32.69 kg/m   Constitutional:  Alert and oriented, No acute distress. HEENT: [place] AT, mask in place.  Trachea midline Cardiovascular: No clubbing, cyanosis, or edema. Respiratory: Normal respiratory effort, no increased work of breathing. Neurologic: Grossly intact, no focal deficits, moving all 4 extremities. Psychiatric: Normal mood and affect.  Laboratory Data: N/A   Pertinent Imaging: CLINICAL DATA:  Kidney stones   EXAM: ABDOMEN - 1 VIEW   COMPARISON:  08/30/2020   FINDINGS: Cluster of small calculi overlying the left kidney appears unchanged. No ureteral calculi   No right renal calculi. Surgical clips right abdomen. Normal bowel gas pattern.   IMPRESSION: Cluster of small left renal calculi unchanged.     Electronically Signed   By: Charles  Clark M.D.   On: 10/20/2020 09:18 I have independently reviewed the films.  See HPI.      Assessment & Plan:    Left renal stone  - KUB showed stone is still present, but is fragmented - Increase fluid intake to facilitate stone passage  -follow up in one month for KUB  Nocturia/urgency  - He is undergoing a sleep study to determine best option for CPAP night shift worker, so sleep schedule is off -has daytime urgency (which is his night) affecting sleep   ED  - Continue tadalafil 20 mg  on-demand-dosing; [facility]   Return in about 1 month (around 11/17/2020) for KUB and UA .  [facility] [address], [address] [place], [place] [address] [phone]  I,SHANNON MCGOWAN,acting as a scribe for SHANNON MCGOWAN, PA-C.,have documented all relevant documentation on the behalf of SHANNON MCGOWAN, PA-C,as directed by  SHANNON MCGOWAN, PA-C while in the presence of SHANNON MCGOWAN, PA-C.  I have reviewed the above documentation for accuracy and completeness, and I agree with the above.    Shannon McGowan, PA-C   

## 2020-10-18 ENCOUNTER — Ambulatory Visit
Admission: RE | Admit: 2020-10-18 | Discharge: 2020-10-18 | Disposition: A | Discharge status: Home / Self Care | Payer: Commercial Managed Care - PPO | Source: Ambulatory Visit | Attending: Urology | Admitting: Urology

## 2020-10-18 ENCOUNTER — Encounter: Payer: Self-pay | Admitting: Urology

## 2020-10-18 ENCOUNTER — Other Ambulatory Visit: Payer: Self-pay | Admitting: *Deleted

## 2020-10-18 ENCOUNTER — Ambulatory Visit (INDEPENDENT_AMBULATORY_CARE_PROVIDER_SITE_OTHER): Payer: Commercial Managed Care - PPO | Admitting: Urology

## 2020-10-18 VITALS — BP 166/121 | HR 85 | Ht 72.0 in | Wt 241.0 lb

## 2020-10-18 DIAGNOSIS — N200 Calculus of kidney: Secondary | ICD-10-CM | POA: Diagnosis not present

## 2020-10-18 DIAGNOSIS — N529 Male erectile dysfunction, unspecified: Secondary | ICD-10-CM | POA: Diagnosis not present

## 2020-10-18 MED ORDER — TADALAFIL 20 MG PO TABS: ORAL_TABLET | ORAL | 0 refills | Status: DC

## 2020-10-19 ENCOUNTER — Ambulatory Visit: Payer: Commercial Managed Care - PPO | Attending: Pulmonary Disease

## 2020-10-19 DIAGNOSIS — G4733 Obstructive sleep apnea (adult) (pediatric): Secondary | ICD-10-CM | POA: Insufficient documentation

## 2020-10-19 DIAGNOSIS — F5104 Psychophysiologic insomnia: Secondary | ICD-10-CM | POA: Insufficient documentation

## 2020-10-22 ENCOUNTER — Other Ambulatory Visit: Payer: Self-pay

## 2020-10-29 ENCOUNTER — Telehealth: Payer: Self-pay | Admitting: Adult Health

## 2020-10-29 NOTE — Telephone Encounter (Signed)
Called and spoke with patient, provided results/recommendations per Rexene Edison NP.  Setup video visit per TP for tomorrow at 3:20 pm, advised patient to log in 15 minutes early.  He verbalized understanding.  Nothing further needed.

## 2020-10-29 NOTE — Telephone Encounter (Signed)
CPAP titration study completed on October 19, 2020 shows optimal control on CPAP 12 cm H2O with ResMed F 30 fullface mask size medium.  Please set up office visit or video visit to discuss CPAP titration results and to discuss treatment plan.

## 2020-10-30 ENCOUNTER — Telehealth (INDEPENDENT_AMBULATORY_CARE_PROVIDER_SITE_OTHER): Payer: Commercial Managed Care - PPO | Admitting: Adult Health

## 2020-10-30 ENCOUNTER — Encounter: Payer: Self-pay | Admitting: Adult Health

## 2020-10-30 DIAGNOSIS — G4733 Obstructive sleep apnea (adult) (pediatric): Secondary | ICD-10-CM | POA: Diagnosis not present

## 2020-10-30 NOTE — Patient Instructions (Signed)
Send order for supplies to  [facility] [employer]  Order to DME for download. May need SD card.  Wear CPAP At bedtime  all night  Work on healthy weight .  Do not drive if sleepy  Follow up with Dr. Mortimer Fries or Savas Elvin NP in [place] in 2 months and As needed

## 2020-10-30 NOTE — Progress Notes (Signed)
Virtual Visit via Video Note  I connected with Ronald W Yeagley Jr. on 10/30/20 at  3:30 PM EDT by a video enabled telemedicine application and verified that I am speaking with the correct person using two identifiers.  Location: Patient: Home  Provider:  Office    I discussed the limitations of evaluation and management by telemedicine and the availability of in person appointments. The patient expressed understanding and agreed to proceed.  History of Present Illness: 50-year-old male active smoker seen for for sleep consult September 18, 2020 to establish for sleep apnea Medical history is significant for diabetes, hypertension, hyperlipidemia  Today's video visit is a 6-week follow-up for sleep apnea.  Patient was seen last visit to establish for sleep apnea.  He was diagnosed with sleep apnea around 8 years ago but did not start CPAP.  He has been having ongoing symptoms with daytime sleepiness and restless sleep.  He was referred to ENT.  A sleep study in 2018 showed severe sleep apnea.  Patient was started on CPAP but had difficulties tolerating. Has Resmed machine . Wants to use his machine .  Patient had been having ongoing daytime sleepiness and restless sleep.  He would like to look into restarting CPAP. Patient was set up for a CPAP titration study that was done on October 19, 2020 that showed optimal control on CPAP 12 cm H2O.  With ResMed F 30 fullface mask size medium. We discussed his CPAP titration study results.  Patient has a CPAP machine and would like to use his old machine just needs supplies.  His machine is only 52 years old. Patient education given on sleep apnea and CPAP care.  Past Medical History:  Diagnosis Date   Arthritis    BPH (benign prostatic hyperplasia)    Diabetes (HCC)    Dysuria    GERD (gastroesophageal reflux disease)    History of kidney stones    HTN (hypertension)    Hyperlipidemia    Impotence    Nocturia    Obesity    Pneumonia age 18's    Sleep apnea    CPAP - severe (sleep study 01/11/16)   Testicular hypofunction    Wears dentures    full upper and lower   Current Outpatient Medications on File Prior to Visit  Medication Sig Dispense Refill   alprostadil (MUSE) 1000 MCG pellet 1 each (1,000 mcg total) by Transurethral route as needed for erectile dysfunction. use no more than 3 times per week 6 each 12   amLODipine (NORVASC) 10 MG tablet Take 1 tablet (10 mg total) by mouth daily. 90 tablet 3   atorvastatin (LIPITOR) 80 MG tablet Take 1 tablet (80 mg total) by mouth daily at 6 PM. 90 tablet 1   Blood Glucose Monitoring Suppl (ONETOUCH VERIO) w/Device KIT 1 Device by Does not apply route daily. 1 kit 0   ezetimibe (ZETIA) 10 MG tablet Take 1 tablet (10 mg total) by mouth daily. 90 tablet 3   folic acid (FOLVITE) 1 MG tablet Take 1 tablet (1 mg total) by mouth daily. 90 tablet 3   gabapentin (NEURONTIN) 100 MG capsule TAKE 1 CAPSULE THREE TIMES A DAY 270 capsule 3   glucose blood test strip Use to check blood sugar up to 3 times daily 300 each 3   hydrochlorothiazide (MICROZIDE) 12.5 MG capsule TAKE 1 CAPSULE DAILY 90 capsule 3   HYDROcodone-acetaminophen (NORCO/VICODIN) 5-325 MG tablet Take 1-2 tablets by mouth every 6 (six) hours as needed for   moderate pain. 10 tablet 0   Insulin Glargine-yfgn (SEMGLEE, YFGN,) 100 UNIT/ML SOLN Inject 48 Units into the skin daily. 10 mL 3   losartan (COZAAR) 100 MG tablet Take 1 tablet (100 mg total) by mouth daily. 90 tablet 1   metFORMIN (GLUCOPHAGE-XR) 500 MG 24 hr tablet Take 4 tablets (2,000 mg total) by mouth every evening. 360 tablet 3   OZEMPIC, 1 MG/DOSE, 4 MG/3ML SOPN INJECT 1 MG UNDER THE SKIN ONCE A WEEK 3 mL 12   tadalafil (CIALIS) 20 MG tablet TAKE 1 TABLET BY MOUTH ONCE DAILY AS NEEDED FOR  ERECTILE  DYSFUNCTION  -  EFFECTS  MAY  LAST  UP  TO  36  HRS 30 tablet 0   tadalafil (CIALIS) 5 MG tablet Take 1 tablet (5 mg total) by mouth daily as needed for erectile dysfunction. 90  tablet 3   No current facility-administered medications on file prior to visit.      Observations/Objective: CPAP titration study completed on October 19, 2020 shows optimal control on CPAP 12 cm H2O with ResMed F 30 fullface mask size medium.  Assessment and Plan: Sleep Apnea -CPAP titration study shows optimal control on CPAP 12 cm H2O Patient is to restart CPAP.  Patient education given.  Order for CPAP supplies sent to his DME.  Plan  Patient Instructions  Send order for supplies to  [employer] and [employer]  Order to DME for download. May need SD card.  Wear CPAP At bedtime  all night  Work on healthy weight .  Do not drive if sleepy  Follow up with Dr. Mortimer Fries or Samanthajo Payano NP in [place] in 2 months and As needed      Follow Up Instructions: Follow up in 2 months    I discussed the assessment and treatment plan with the patient. The patient was provided an opportunity to ask questions and all were answered. The patient agreed with the plan and demonstrated an understanding of the instructions.   The patient was advised to call back or seek an in-person evaluation if the symptoms worsen or if the condition fails to improve as anticipated.  I provided 22 minutes of non-face-to-face time during this encounter.   Rexene Edison, NP

## 2020-11-05 ENCOUNTER — Other Ambulatory Visit: Payer: Self-pay | Admitting: Family

## 2020-11-05 DIAGNOSIS — K219 Gastro-esophageal reflux disease without esophagitis: Secondary | ICD-10-CM

## 2020-11-12 ENCOUNTER — Other Ambulatory Visit: Payer: Self-pay | Admitting: Urology

## 2020-11-12 DIAGNOSIS — N529 Male erectile dysfunction, unspecified: Secondary | ICD-10-CM

## 2020-11-19 NOTE — Progress Notes (Deleted)
11/19/20 11:37 AM   Ronald Pruitt. 01-04-1971 [id number]  Referring provider:  Burnard Hawthorne, FNP [address] [place],  [place] [address]  No chief complaint on file.   Urological history  Testosterone deficiency  -contributing factors of age, diabetes and obesity -Testosterone level normalized without treatment   ED  - -contributing factors of age, BPH, testosterone deficiency, DM, HTN, HLD, sleep apnea, and stress -Managed with tadalafil 20 mg, on-demand dosing  3. BPH with LUTS  - PSA 0.3 in 08/2020 -managed with tadalafil 5 mg daily  4. Nephrolithiasis  -Stone composition 80% calcium oxalate monohydrate and 20% calcium oxalate dihydrate - S/p ESWL left renal stone 2021 - S/p ESWL left renal stone 09/20/2020  HPI: Ronald Pruitt. is a 50 y.o.male, who presents today for follow-up with KUB prior.   KUB ***   PMH: Past Medical History:  Diagnosis Date   Arthritis    BPH (benign prostatic hyperplasia)    Diabetes (HCC)    Dysuria    GERD (gastroesophageal reflux disease)    History of kidney stones    HTN (hypertension)    Hyperlipidemia    Impotence    Nocturia    Obesity    Pneumonia age 50's   Sleep apnea    CPAP - severe (sleep study 01/11/16)   Testicular hypofunction    Wears dentures    full upper and lower    Surgical History: Past Surgical History:  Procedure Laterality Date   CHOLECYSTECTOMY     COLONOSCOPY WITH PROPOFOL N/A 10/12/2018   Procedure: COLONOSCOPY WITH PROPOFOL;  Surgeon: Lucilla Lame, MD;  Location: [facility];  Service: Endoscopy;  Laterality: N/A;   EXTRACORPOREAL SHOCK WAVE LITHOTRIPSY Left 09/29/2019   Procedure: EXTRACORPOREAL SHOCK WAVE LITHOTRIPSY (ESWL);  Surgeon: Hollice Espy, MD;  Location: [facility] ORS;  Service: Urology;  Laterality: Left;   EXTRACORPOREAL SHOCK WAVE LITHOTRIPSY Left 09/20/2020   Procedure: EXTRACORPOREAL SHOCK WAVE LITHOTRIPSY (ESWL);  Surgeon: Hollice Espy, MD;   Location: [facility] OR[facility];  Service: Urology;  Laterality: Left;   NASAL SEPTOPLASTY W/ TURBINOPLASTY N/A 05/20/2017   Procedure: NASAL SEPTOPLASTY WITH SUBMUCOUS RESECTION;  Surgeon: Clyde Canterbury, MD;  Location: [facility] ORS;  Service: ENT;  Laterality: N/A;   VASECTOMY      Home Medications:  Allergies as of 11/20/2020   No Known Allergies      Medication List        Accurate as of November 19, 2020 11:37 AM. If you have any questions, ask your nurse or doctor.          alprostadil 1000 MCG pellet Commonly known as: Muse 1 each (1,000 mcg total) by Transurethral route as needed for erectile dysfunction. use no more than 3 times per week   amLODipine 10 MG tablet Commonly known as: NORVASC Take 1 tablet (10 mg total) by mouth daily.   atorvastatin 80 MG tablet Commonly known as: LIPITOR Take 1 tablet (80 mg total) by mouth daily at 6 PM.   ezetimibe 10 MG tablet Commonly known as: ZETIA Take 1 tablet (10 mg total) by mouth daily.   folic acid 1 MG tablet Commonly known as: FOLVITE Take 1 tablet (1 mg total) by mouth daily.   gabapentin 100 MG capsule Commonly known as: NEURONTIN TAKE 1 CAPSULE THREE TIMES A DAY   glucose blood test strip Use to check blood sugar up to 3 times daily   hydrochlorothiazide 12.5 MG capsule Commonly known as: MICROZIDE TAKE 1  CAPSULE DAILY   HYDROcodone-acetaminophen 5-325 MG tablet Commonly known as: NORCO/VICODIN Take 1-2 tablets by mouth every 6 (six) hours as needed for moderate pain.   insulin glargine-yfgn 100 UNIT/ML injection Commonly known as: Semglee (yfgn) Inject 48 Units into the skin daily.   losartan 100 MG tablet Commonly known as: COZAAR Take 1 tablet (100 mg total) by mouth daily.   metFORMIN 500 MG 24 hr tablet Commonly known as: GLUCOPHAGE-XR Take 4 tablets (2,000 mg total) by mouth every evening.   omeprazole 20 MG capsule Commonly known as: PRILOSEC TAKE 1 CAPSULE DAILY   OneTouch Verio w/Device Kit 1  Device by Does not apply route daily.   Ozempic (1 MG/DOSE) 4 MG/3ML Sopn Generic drug: Semaglutide (1 MG/DOSE) INJECT 1 MG UNDER THE SKIN ONCE A WEEK   tadalafil 5 MG tablet Commonly known as: CIALIS Take 1 tablet (5 mg total) by mouth daily as needed for erectile dysfunction.   tadalafil 20 MG tablet Commonly known as: CIALIS TAKE 1 TABLET BY MOUTH ONCE DAILY AS NEEDED FOR  ERECTILE  DYSFUNCTION  -  EFFECTS  MAY  LAST  UP  TO  36  HRS        Allergies:  No Known Allergies  Family History: Family History  Problem Relation Age of Onset   Hyperlipidemia Mother    Diabetes Mellitus II Mother    Seizures Mother    Diabetes Mother    Mental illness Mother    Arthritis Mother    Diabetes Father    Cirrhosis Father        non alcoholic   Hyperlipidemia Father    Arthritis Father    Liver cancer Father    Diabetes Maternal Grandmother    Hyperlipidemia Maternal Grandmother    Arthritis Maternal Grandmother    Diabetes Maternal Grandfather    Hyperlipidemia Maternal Grandfather    Arthritis Maternal Grandfather    Diabetes Paternal Grandmother    Hyperlipidemia Paternal Grandmother    Arthritis Paternal Grandmother    Diabetes Paternal Grandfather    Hyperlipidemia Paternal Grandfather    Arthritis Paternal Grandfather    Kidney disease Neg Hx    Prostate cancer Neg Hx    Kidney cancer Neg Hx    Bladder Cancer Neg Hx    Colon cancer Neg Hx    Thyroid cancer Neg Hx     Social History:  reports that he has been smoking cigarettes. He has a 10.00 pack-year smoking history. He has never used smokeless tobacco. He reports that he does not drink alcohol and does not use drugs.   Physical Exam: There were no vitals taken for this visit.  Constitutional:  Well nourished. Alert and oriented, No acute distress. HEENT: [place] AT, moist mucus membranes.  Trachea midline Cardiovascular: No clubbing, cyanosis, or edema. Respiratory: Normal respiratory effort, no increased work  of breathing. GI: Abdomen is soft, non tender, non distended, no abdominal masses. Liver and spleen not palpable.  No hernias appreciated.  Stool sample for occult testing is not indicated.   GU: No CVA tenderness.  No bladder fullness or masses.  Patient with circumcised/uncircumcised phallus. ***Foreskin easily retracted***  Urethral meatus is patent.  No penile discharge. No penile lesions or rashes. Scrotum without lesions, cysts, rashes and/or edema.  Testicles are located scrotally bilaterally. No masses are appreciated in the testicles. Left and right epididymis are normal. Rectal: Patient with  normal sphincter tone. Anus and perineum without scarring or rashes. No rectal masses are appreciated.  Prostate is approximately *** grams, *** nodules are appreciated. Seminal vesicles are normal. Skin: No rashes, bruises or suspicious lesions. Lymph: No inguinal adenopathy. Neurologic: Grossly intact, no focal deficits, moving all 4 extremities. Psychiatric: Normal mood and affect.   Laboratory Data: N/A   Pertinent Imaging: *** I have independently reviewed the films.  See HPI.      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  [facility] [address] [place], [place] [address] [phone] Zara Council, PA-C

## 2020-11-20 ENCOUNTER — Encounter: Payer: Self-pay | Admitting: Urology

## 2020-11-20 ENCOUNTER — Ambulatory Visit: Payer: Commercial Managed Care - PPO | Admitting: Urology

## 2020-11-20 DIAGNOSIS — N200 Calculus of kidney: Secondary | ICD-10-CM

## 2020-12-17 ENCOUNTER — Other Ambulatory Visit: Payer: Self-pay | Admitting: Family

## 2020-12-17 DIAGNOSIS — E1140 Type 2 diabetes mellitus with diabetic neuropathy, unspecified: Secondary | ICD-10-CM

## 2021-01-22 ENCOUNTER — Other Ambulatory Visit: Payer: Self-pay | Admitting: *Deleted

## 2021-01-22 DIAGNOSIS — N529 Male erectile dysfunction, unspecified: Secondary | ICD-10-CM

## 2021-01-22 MED ORDER — TADALAFIL 20 MG PO TABS: ORAL_TABLET | ORAL | 0 refills | Status: DC

## 2021-01-23 ENCOUNTER — Encounter: Payer: Self-pay | Admitting: Family

## 2021-01-23 ENCOUNTER — Telehealth: Payer: Self-pay | Admitting: Pharmacist

## 2021-01-23 DIAGNOSIS — E1140 Type 2 diabetes mellitus with diabetic neuropathy, unspecified: Secondary | ICD-10-CM

## 2021-01-23 DIAGNOSIS — E785 Hyperlipidemia, unspecified: Secondary | ICD-10-CM

## 2021-01-23 DIAGNOSIS — I10 Essential (primary) hypertension: Secondary | ICD-10-CM

## 2021-01-23 DIAGNOSIS — K219 Gastro-esophageal reflux disease without esophagitis: Secondary | ICD-10-CM

## 2021-01-23 MED ORDER — HYDROCHLOROTHIAZIDE 12.5 MG PO CAPS
12.5000 mg | ORAL_CAPSULE | Freq: Every day | ORAL | 1 refills | Status: DC
Start: 2021-01-23 — End: 2022-07-22

## 2021-01-23 MED ORDER — EZETIMIBE 10 MG PO TABS: 10.0000 mg | ORAL_TABLET | Freq: Every day | ORAL | 1 refills | Status: DC

## 2021-01-23 MED ORDER — INSULIN GLARGINE-YFGN 100 UNIT/ML ~~LOC~~ SOLN: 48.0000 [IU] | Freq: Every day | SUBCUTANEOUS | 3 refills | Status: DC

## 2021-01-23 MED ORDER — AMLODIPINE BESYLATE 10 MG PO TABS: 10.0000 mg | ORAL_TABLET | Freq: Every day | ORAL | 3 refills | Status: DC

## 2021-01-23 MED ORDER — OZEMPIC (1 MG/DOSE) 4 MG/3ML ~~LOC~~ SOPN
PEN_INJECTOR | SUBCUTANEOUS | 12 refills | Status: DC
Start: 2021-01-23 — End: 2021-01-25

## 2021-01-23 MED ORDER — GABAPENTIN 100 MG PO CAPS: 100.0000 mg | ORAL_CAPSULE | Freq: Three times a day (TID) | ORAL | 3 refills | Status: DC

## 2021-01-23 MED ORDER — LOSARTAN POTASSIUM 100 MG PO TABS: 100.0000 mg | ORAL_TABLET | Freq: Every day | ORAL | 1 refills | Status: DC

## 2021-01-23 MED ORDER — METFORMIN HCL ER 500 MG PO TB24: ORAL_TABLET | ORAL | 3 refills | Status: DC

## 2021-01-23 MED ORDER — OMEPRAZOLE 20 MG PO CPDR: 20.0000 mg | DELAYED_RELEASE_CAPSULE | Freq: Every day | ORAL | 1 refills | Status: DC

## 2021-01-23 NOTE — Telephone Encounter (Signed)
Per Cover My Meds, [place] required for Ozempic 1 mg (4 mg/3 mL - key [id number]. Submitted. Will follow for outcome. Notified patient

## 2021-01-24 NOTE — Telephone Encounter (Addendum)
PA Approved for Ozempic from 01/24/21-01/25/2024. Notified pharmacy, they cannot order right now. Notified patient via [place]. Offered to send to [place]

## 2021-01-25 ENCOUNTER — Telehealth: Payer: Self-pay | Admitting: Family

## 2021-01-25 ENCOUNTER — Other Ambulatory Visit: Payer: Self-pay

## 2021-01-25 DIAGNOSIS — E1140 Type 2 diabetes mellitus with diabetic neuropathy, unspecified: Secondary | ICD-10-CM

## 2021-01-25 DIAGNOSIS — Z720 Tobacco use: Secondary | ICD-10-CM

## 2021-01-25 MED ORDER — OZEMPIC (1 MG/DOSE) 4 MG/3ML ~~LOC~~ SOPN
PEN_INJECTOR | SUBCUTANEOUS | 1 refills | Status: DC
  Filled 2021-01-25: qty 3, 30d supply, fill #0
  Filled 2021-01-25: qty 3, 28d supply, fill #0

## 2021-01-25 NOTE — Telephone Encounter (Signed)
Pt called and aware that referral was placed. He will let us know if he does not hear in regards to an appointment.

## 2021-01-25 NOTE — Telephone Encounter (Signed)
Juluis Rainier Griselda Miner,  Referral placed to pulmonology whom now runs Lung cancer screening program. Eric Form, NP  Let us know if you dont hear back within a week in regards to an appointment being scheduled.   This annual screen that will be done EVERY year

## 2021-01-25 NOTE — Addendum Note (Signed)
Addended by: De Hollingshead on: 01/25/2021 08:21 AM   Modules accepted: Orders

## 2021-01-29 ENCOUNTER — Telehealth: Payer: Self-pay | Admitting: Pharmacist

## 2021-01-29 DIAGNOSIS — E1140 Type 2 diabetes mellitus with diabetic neuropathy, unspecified: Secondary | ICD-10-CM

## 2021-01-29 MED ORDER — BASAGLAR KWIKPEN 100 UNIT/ML ~~LOC~~ SOPN: 48.0000 [IU] | PEN_INJECTOR | Freq: Every day | SUBCUTANEOUS | 1 refills | Status: DC

## 2021-01-29 NOTE — Telephone Encounter (Signed)
Thanks catie for sending in basaglar 48 units Can we sch him with you after he sees me?  Sarah Call pt He is very over due for DM followup Ensure he is aware that semglee not on formulary and we have sent in basaglar 48 units. Please confirm that this dose is accurate  He needs an appt asap and a1c. I ordered fasting labs. Please sch 1-2 days prior to appt.   He will need follow up with catie as well.

## 2021-01-29 NOTE — Telephone Encounter (Signed)
Received fax from [facility] that [facility] is non-formulary, but [facility] is. Resent script for Basaglar under PCP.

## 2021-01-29 NOTE — Telephone Encounter (Signed)
Pt unsure of scheduled times will work, but she is scheduled 03/08/21 for follow-up at 10 then fasting labs the day prior at 8:15 since that was the possible earliest time that week. 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

## 2021-02-21 ENCOUNTER — Other Ambulatory Visit: Payer: Self-pay | Admitting: Urology

## 2021-02-21 DIAGNOSIS — N529 Male erectile dysfunction, unspecified: Secondary | ICD-10-CM

## 2021-03-07 ENCOUNTER — Other Ambulatory Visit: Payer: Self-pay

## 2021-03-07 ENCOUNTER — Other Ambulatory Visit (INDEPENDENT_AMBULATORY_CARE_PROVIDER_SITE_OTHER): Payer: 59

## 2021-03-07 DIAGNOSIS — E1140 Type 2 diabetes mellitus with diabetic neuropathy, unspecified: Secondary | ICD-10-CM | POA: Diagnosis not present

## 2021-03-07 LAB — COMPREHENSIVE METABOLIC PANEL
ALT: 19 U/L (ref 0–53)
AST: 21 U/L (ref 0–37)
Albumin: 3.8 g/dL (ref 3.5–5.2)
Alkaline Phosphatase: 88 U/L (ref 39–117)
BUN: 12 mg/dL (ref 6–23)
CO2: 26 mEq/L (ref 19–32)
Calcium: 8.9 mg/dL (ref 8.4–10.5)
Chloride: 104 mEq/L (ref 96–112)
Creatinine, Ser: 0.89 mg/dL (ref 0.40–1.50)
GFR: 98.37 mL/min (ref >60.00)
Glucose, Bld: 137 mg/dL — ABNORMAL HIGH (ref 70–99)
Potassium: 3.8 mEq/L (ref 3.5–5.1)
Sodium: 137 mEq/L (ref 135–145)
Total Bilirubin: 0.6 mg/dL (ref 0.2–1.2)
Total Protein: 6.8 g/dL (ref 6.0–8.3)

## 2021-03-07 LAB — CBC WITH DIFFERENTIAL/PLATELET
Basophils Absolute: 0 10*3/uL (ref 0.0–0.1)
Basophils Relative: 0.7 % (ref 0.0–3.0)
Eosinophils Absolute: 0.1 10*3/uL (ref 0.0–0.7)
Eosinophils Relative: 2.2 % (ref 0.0–5.0)
HCT: 38.5 % — ABNORMAL LOW (ref 39.0–52.0)
Hemoglobin: 12.8 g/dL — ABNORMAL LOW (ref 13.0–17.0)
Lymphocytes Relative: 23.5 % (ref 12.0–46.0)
Lymphs Abs: 1.6 10*3/uL (ref 0.7–4.0)
MCHC: 33.2 g/dL (ref 30.0–36.0)
MCV: 83.7 fl (ref 78.0–100.0)
Monocytes Absolute: 0.5 10*3/uL (ref 0.1–1.0)
Monocytes Relative: 7.3 % (ref 3.0–12.0)
Neutro Abs: 4.5 10*3/uL (ref 1.4–7.7)
Neutrophils Relative %: 66.3 % (ref 43.0–77.0)
Platelets: 205 10*3/uL (ref 150.0–400.0)
RBC: 4.59 Mil/uL (ref 4.22–5.81)
RDW: 15.4 % (ref 11.5–15.5)
WBC: 6.7 10*3/uL (ref 4.0–10.5)

## 2021-03-07 LAB — HEMOGLOBIN A1C: Hgb A1c MFr Bld: 7.5 % — ABNORMAL HIGH (ref 4.6–6.5)

## 2021-03-07 LAB — LIPID PANEL
Cholesterol: 132 mg/dL (ref 0–200)
HDL: 27 mg/dL — ABNORMAL LOW (ref >39.00)
LDL Cholesterol: 90 mg/dL (ref 0–99)
NonHDL: 105.09
Total CHOL/HDL Ratio: 5
Triglycerides: 76 mg/dL (ref 0.0–149.0)
VLDL: 15.2 mg/dL (ref 0.0–40.0)

## 2021-03-08 ENCOUNTER — Other Ambulatory Visit: Payer: Self-pay

## 2021-03-08 ENCOUNTER — Encounter: Payer: Self-pay | Admitting: Family

## 2021-03-08 ENCOUNTER — Ambulatory Visit: Payer: 59 | Admitting: Family

## 2021-03-08 VITALS — BP 124/84 | HR 95 | Temp 98.2°F | Ht 72.0 in | Wt 230.2 lb

## 2021-03-08 DIAGNOSIS — I10 Essential (primary) hypertension: Secondary | ICD-10-CM | POA: Diagnosis not present

## 2021-03-08 DIAGNOSIS — E785 Hyperlipidemia, unspecified: Secondary | ICD-10-CM

## 2021-03-08 DIAGNOSIS — D649 Anemia, unspecified: Secondary | ICD-10-CM | POA: Diagnosis not present

## 2021-03-08 DIAGNOSIS — Z794 Long term (current) use of insulin: Secondary | ICD-10-CM

## 2021-03-08 DIAGNOSIS — E1140 Type 2 diabetes mellitus with diabetic neuropathy, unspecified: Secondary | ICD-10-CM | POA: Diagnosis not present

## 2021-03-08 LAB — B12 AND FOLATE PANEL
Folate: 10.2 ng/mL (ref >5.9)
Vitamin B-12: 190 pg/mL — ABNORMAL LOW (ref 211–911)

## 2021-03-08 MED ORDER — LOSARTAN POTASSIUM 25 MG PO TABS: ORAL_TABLET | ORAL | 3 refills | Status: DC

## 2021-03-08 MED ORDER — ONETOUCH VERIO VI STRP: ORAL_STRIP | 12 refills | Status: AC

## 2021-03-08 MED ORDER — ATORVASTATIN CALCIUM 80 MG PO TABS: 80.0000 mg | ORAL_TABLET | Freq: Every day | ORAL | 1 refills | Status: DC

## 2021-03-08 MED ORDER — SEMAGLUTIDE (2 MG/DOSE) 8 MG/3ML ~~LOC~~ SOPN: 2.0000 mg | PEN_INJECTOR | SUBCUTANEOUS | 1 refills | Status: DC

## 2021-03-08 MED ORDER — ONETOUCH DELICA LANCETS 33G MISC: 3 refills | Status: AC

## 2021-03-08 NOTE — Assessment & Plan Note (Addendum)
Lab Results  Component Value Date   HGBA1C 7.5 (H) 03/07/2021   Marked improvement although remains uncontrolled.  Increase ozempic to 2mg  and decrease basaglar from 48 units to 38 units.  Continue metformin 2000mg  qhs.Provided glucometer from our office.  Emphasized the importance of routinely checking blood sugars, particularly fasting blood sugars as we further decrease Basaglar while increasing ozempic.  Patient verbalized understanding.  Close follow-up

## 2021-03-08 NOTE — Patient Instructions (Addendum)
Please confirm if taking zetia  Increase ozempic to 2mg   DECREASE basaglar from 48 units to 38 units.   Call pulmonology, Clemetine Marker to ensure you get cipap supplies  Please confirm if you are on losartan or not. We can decrease this dose to 12.5mg  but important to stay on medication in setting of diabetes.   Goal of fasting blood sugar is between 70-120. If in this range, we are reaching our target a1c ( goal 6.5%)   Please check fasting blood sugar in the morning time 3-4 times per week.  You may also check if you feel like you are having a low episode or particularly high episode of blood sugar.  If blood sugars increase, I may advise you to check blood sugar after your largest meal.  You specifically do this TWO hours after largest meal with the goal of being less than 180.  If blood sugar is checked sooner than 2 hours after largest meal, and it will be  expected to be elevated. You must wait 2 hours.   If your blood sugar is less than 180 hours after your largest meal, again we are reaching our target a1c goal   Call [facility] if: BG < 70 or > 300.   If you have any symptoms of low blood sugar ( sweating, shakiness, lightheaded, dizzy) that you notify me. If you have a low, please drink a glass of orange juice and recheck blood sugar every 5 minutes until you dont feel symptomatic AND blood sugar is above 80.

## 2021-03-08 NOTE — Assessment & Plan Note (Signed)
Blood pressure well controlled today.  Advised to continue amlodipine 10 mg, hydrochlorothiazide 12.5 mg.  Patient reports he does not think he is taking losartan 100 mg.  In the setting of diabetes,  advised even if a very low dose of ARB, I would advise for renal protection and important to resume.  He will confirm whether or not he is taking losartan at home and very likely I will send in losartan 12.5 mg to start while monitoring for recurrence of dizziness.

## 2021-03-08 NOTE — Progress Notes (Signed)
Subjective:    Patient ID: Ronald Pruitt., male    DOB: Oct 19, 1970, 51 y.o.   MRN: [id number]  CC: Ronald Pruitt. is a 51 y.o. male who presents today for follow up.   HPI: Feels well today No new complaints    HTN- compliant with  amlodipine 83m ,hctz 12.592m no sob, cp. He doesn't think he is taking losartan 10053ms felt dizzy, since resolved.     DM- compliant with ozempic 1mg10mlargine 48 units, metformin 2000mg33m. Eating less sweets. Tolerating ozempic well. No hypoglycemic episodes. Not checking blood sugars. He doesn't have glucometer.  HLD-compliant with Lipitor 80 mg. He is not sure if he is taking zetia 10mg 68mdoesn't think he is taking.   OSA- had an insurance and change and doesn't have cpap supplies.   Following with pulmonology, Tammy Clemetine Markerst seen 10/30/2020.  He was to restart CPAP.  Anemia-Colonoscopy is up-to-date. Denies rectal bleeding.   HISTORY:  Past Medical History:  Diagnosis Date   Arthritis    BPH (benign prostatic hyperplasia)    Diabetes (HCC)    Dysuria    GERD (gastroesophageal reflux disease)    History of kidney stones    HTN (hypertension)    Hyperlipidemia    Impotence    Nocturia    Obesity    Pneumonia age 71's   Sleep apnea    CPAP - severe (sleep study 01/11/16)   Testicular hypofunction    Wears dentures    full upper and lower   Past Surgical History:  Procedure Laterality Date   CHOLECYSTECTOMY     COLONOSCOPY WITH PROPOFOL N/A 10/12/2018   Procedure: COLONOSCOPY WITH PROPOFOL;  Surgeon: Wohl, Lucilla Lame Location: [facility];  Service: Endoscopy;  Laterality: N/A;   EXTRACORPOREAL SHOCK WAVE LITHOTRIPSY Left 09/29/2019   Procedure: EXTRACORPOREAL SHOCK WAVE LITHOTRIPSY (ESWL);  Surgeon: BrandoHollice Espy Location: [facility];  Service: Urology;  Laterality: Left;   EXTRACORPOREAL SHOCK WAVE LITHOTRIPSY Left 09/20/2020   Procedure: EXTRACORPOREAL SHOCK WAVE LITHOTRIPSY (ESWL);  Surgeon: BrandoHollice Espy Location: [facility];  Service: Urology;  Laterality: Left;   NASAL SEPTOPLASTY W/ TURBINOPLASTY N/A 05/20/2017   Procedure: NASAL SEPTOPLASTY WITH SUBMUCOUS RESECTION;  Surgeon: BennetClyde Canterbury Location: [facility] OR[facility];  Service: ENT;  Laterality: N/A;   VASECTOMY     Family History  Problem Relation Age of Onset   Hyperlipidemia Mother    Diabetes Mellitus II Mother    Seizures Mother    Diabetes Mother    Mental illness Mother    Arthritis Mother    Diabetes Father    Cirrhosis Father        non alcoholic   Hyperlipidemia Father    Arthritis Father    Liver cancer Father    Diabetes Maternal Grandmother    Hyperlipidemia Maternal Grandmother    Arthritis Maternal Grandmother    Diabetes Maternal Grandfather    Hyperlipidemia Maternal Grandfather    Arthritis Maternal Grandfather    Diabetes Paternal Grandmother    Hyperlipidemia Paternal Grandmother    Arthritis Paternal Grandmother    Diabetes Paternal Grandfather    Hyperlipidemia Paternal Grandfather    Arthritis Paternal Grandfather    Kidney disease Neg Hx    Prostate cancer Neg Hx    Kidney cancer Neg Hx    Bladder Cancer Neg Hx    Colon cancer Neg Hx    Thyroid cancer Neg Hx  Allergies: Patient has no known allergies. Current Outpatient Medications on File Prior to Visit  Medication Sig Dispense Refill   amLODipine (NORVASC) 10 MG tablet Take 1 tablet (10 mg total) by mouth daily. 90 tablet 3   atorvastatin (LIPITOR) 80 MG tablet Take 1 tablet (80 mg total) by mouth daily at 6 PM. 90 tablet 1   Blood Glucose Monitoring Suppl (ONETOUCH VERIO) w/Device KIT 1 Device by Does not apply route daily. 1 kit 0   ezetimibe (ZETIA) 10 MG tablet Take 1 tablet (10 mg total) by mouth daily. 90 tablet 1   folic acid (FOLVITE) 1 MG tablet Take 1 tablet (1 mg total) by mouth daily. 90 tablet 3   gabapentin (NEURONTIN) 100 MG capsule Take 1 capsule (100 mg total) by mouth 3 (three) times daily. 270 capsule 3    hydrochlorothiazide (MICROZIDE) 12.5 MG capsule Take 1 capsule (12.5 mg total) by mouth daily. 90 capsule 1   Insulin Glargine (BASAGLAR KWIKPEN) 100 UNIT/ML Inject 48 Units into the skin daily. 45 mL 1   losartan (COZAAR) 100 MG tablet Take 1 tablet (100 mg total) by mouth daily. 90 tablet 1   metFORMIN (GLUCOPHAGE-XR) 500 MG 24 hr tablet TAKE 4 TABLETS EVERY EVENING 360 tablet 3   omeprazole (PRILOSEC) 20 MG capsule Take 1 capsule (20 mg total) by mouth daily. 90 capsule 1   tadalafil (CIALIS) 20 MG tablet TAKE 1 TABLET BY MOUTH ONCE DAILY AS NEEDED FOR  ERECTILE  DYSFUNCTION  -  EFFECTS  MAY  LAST  UP  TO  36  HRS 30 tablet 0   tadalafil (CIALIS) 5 MG tablet Take 1 tablet (5 mg total) by mouth daily as needed for erectile dysfunction. 90 tablet 3   No current facility-administered medications on file prior to visit.    Social History   Tobacco Use   Smoking status: Every Day    Packs/day: 0.50    Years: 20.00    Pack years: 10.00    Types: Cigarettes   Smokeless tobacco: Never  Vaping Use   Vaping Use: Former  Substance Use Topics   Alcohol use: No    Alcohol/week: 0.0 standard drinks   Drug use: Never    Review of Systems  Constitutional:  Negative for chills and fever.  Respiratory:  Negative for cough.   Cardiovascular:  Negative for chest pain and palpitations.  Gastrointestinal:  Negative for nausea and vomiting.     Objective:    BP 124/84 (BP Location: Left Arm, Patient Position: Sitting, Cuff Size: Large)    Pulse 95    Temp 98.2 F (36.8 C) (Oral)    Ht 6' (1.829 m)    Wt 230 lb 3.2 oz (104.4 kg)    SpO2 98%    BMI 31.22 kg/m  BP Readings from Last 3 Encounters:  03/08/21 124/84  10/18/20 (!) 166/121  09/20/20 138/71   Wt Readings from Last 3 Encounters:  03/08/21 230 lb 3.2 oz (104.4 kg)  10/18/20 241 lb (109.3 kg)  09/20/20 241 lb (109.3 kg)    Physical Exam Vitals reviewed.  Constitutional:      Appearance: He is well-developed.  Cardiovascular:      Rate and Rhythm: Regular rhythm.     Heart sounds: Normal heart sounds.  Pulmonary:     Effort: Pulmonary effort is normal. No respiratory distress.     Breath sounds: Normal breath sounds. No wheezing, rhonchi or rales.  Skin:    General: Skin is  warm and dry.  Neurological:     Mental Status: He is alert.  Psychiatric:        Speech: Speech normal.        Behavior: Behavior normal.       Assessment & Plan:   Problem List Items Addressed This Visit       Cardiovascular and Mediastinum   HTN (hypertension)    Blood pressure well controlled today.  Advised to continue amlodipine 10 mg, hydrochlorothiazide 12.5 mg.  Patient reports he does not think he is taking losartan 100 mg.  In the setting of diabetes,  advised even if a very low dose of ARB, I would advise for renal protection and important to resume.  He will confirm whether or not he is taking losartan at home and very likely I will send in losartan 12.5 mg to start while monitoring for recurrence of dizziness.         Endocrine   DM (diabetes mellitus) ([place])   Relevant Medications   Semaglutide, 2 MG/DOSE, 8 MG/3ML SOPN   Type 2 diabetes mellitus with diabetic neuropathy, unspecified ([place])    Lab Results  Component Value Date   HGBA1C 7.5 (H) 03/07/2021  Marked improvement although remains uncontrolled.  Increase ozempic to 61m and decrease basaglar from 48 units to 38 units.  Continue metformin 20061mqhs.Provided glucometer from our office.  Emphasized the importance of routinely checking blood sugars, particularly fasting blood sugars as we further decrease Basaglar while increasing ozempic.  Patient verbalized understanding.  Close follow-up      Relevant Medications   Semaglutide, 2 MG/DOSE, 8 MG/3ML SOPN     Other   HLD (hyperlipidemia)    Uncontrolled. LDL > 70.  Patient appears to be compliant with atorvastatin 80 mg although he is not sure if he has been taking Zetia.  Of asked him to confirm this so we can  work to obtain LDL < 70.       Other Visit Diagnoses     Anemia, unspecified type    -  Primary   Relevant Orders   Fecal occult blood, imunochemical   IBC + Ferritin   B12 and Folate Panel        I have discontinued Ronald RegalStewart Jr.'s alprostadil, HYDROcodone-acetaminophen, and Ozempic (1 MG/DOSE). I am also having him start on Semaglutide (2 MG/DOSE). Additionally, I am having him maintain his OneTouch Verio, folic acid, atorvastatin, tadalafil, tadalafil, omeprazole, metFORMIN, losartan, hydrochlorothiazide, gabapentin, ezetimibe, amLODipine, and Basaglar KwikPen.   Meds ordered this encounter  Medications   Semaglutide, 2 MG/DOSE, 8 MG/3ML SOPN    Sig: Inject 2 mg as directed once a week.    Dispense:  9 mL    Refill:  1    Order Specific Question:   Supervising Provider    Answer:   Ronald Pruitt]   Of note: labs discussed during office visit from yesterday.  Return precautions given.   Risks, benefits, and alternatives of the medications and treatment plan prescribed today were discussed, and patient expressed understanding.   Education regarding symptom management and diagnosis given to patient on AVS.  Continue to follow with Ronald Pruitt for routine health maintenance.   RoBenjie Karvonenand I agreed with plan.   MaMable ParisFNP

## 2021-03-08 NOTE — Assessment & Plan Note (Addendum)
Uncontrolled. LDL > 70.  Patient appears to be compliant with atorvastatin 80 mg although he is not sure if he has been taking Zetia.  Of asked him to confirm this so we can work to obtain LDL < 70.

## 2021-03-11 ENCOUNTER — Encounter: Payer: Self-pay | Admitting: Family

## 2021-03-11 ENCOUNTER — Telehealth: Payer: Self-pay | Admitting: Family

## 2021-03-11 ENCOUNTER — Telehealth (INDEPENDENT_AMBULATORY_CARE_PROVIDER_SITE_OTHER): Payer: 59 | Admitting: Family

## 2021-03-11 ENCOUNTER — Other Ambulatory Visit: Payer: Self-pay | Admitting: Family

## 2021-03-11 VITALS — Ht 72.0 in

## 2021-03-11 DIAGNOSIS — E1140 Type 2 diabetes mellitus with diabetic neuropathy, unspecified: Secondary | ICD-10-CM | POA: Diagnosis not present

## 2021-03-11 DIAGNOSIS — U071 COVID-19: Secondary | ICD-10-CM | POA: Insufficient documentation

## 2021-03-11 DIAGNOSIS — Z794 Long term (current) use of insulin: Secondary | ICD-10-CM | POA: Diagnosis not present

## 2021-03-11 DIAGNOSIS — D649 Anemia, unspecified: Secondary | ICD-10-CM

## 2021-03-11 LAB — IBC + FERRITIN
Ferritin: 8.2 ng/mL — ABNORMAL LOW (ref 22.0–322.0)
Iron: 82 ug/dL (ref 42–165)
Saturation Ratios: 20.8 % (ref 20.0–50.0)
TIBC: 394.8 ug/dL (ref 250.0–450.0)
Transferrin: 282 mg/dL (ref 212.0–360.0)

## 2021-03-11 MED ORDER — HYDROCOD POLI-CHLORPHE POLI ER 10-8 MG/5ML PO SUER: 5.0000 mL | Freq: Every evening | ORAL | 0 refills | Status: DC | PRN

## 2021-03-11 MED ORDER — FERROUS SULFATE 325 (65 FE) MG PO TBEC: 325.0000 mg | DELAYED_RELEASE_TABLET | Freq: Two times a day (BID) | ORAL | 1 refills | Status: DC

## 2021-03-11 MED ORDER — MOLNUPIRAVIR EUA 200MG CAPSULE: 4.0000 | ORAL_CAPSULE | Freq: Two times a day (BID) | ORAL | 0 refills | Status: AC

## 2021-03-11 NOTE — Telephone Encounter (Signed)
Patient scheduled for VV today at 11:30.

## 2021-03-11 NOTE — Telephone Encounter (Signed)
Can you make pt an appt for lung screening program with [facility]?  Ref placed in decemeber   Let me know

## 2021-03-11 NOTE — Telephone Encounter (Signed)
Contacted patient by phone.  Due to his possible covid illness and not feeling well, we will defer his interview today and scheduling.  I have given the patient our direct line to call the [facility] program when he is well and ready to answer his screening questions and get scheduled for the CT>

## 2021-03-11 NOTE — Assessment & Plan Note (Addendum)
Patient well appearing and in no respiratory distress. Discussed molnupiravir.I have counseled on lacking long term safely and effectiveness data of medication,molnupiravir.  Explained EUA for molnupiravir. Criteria met for consideration of  Molnupiravir :  covid positive, patient older than 53 years old, started within 5 days of symptom onset and risk factor for severe disease include: DM, HTN  Counseled on adverse effects including nausea, dizziness, diarrhea.  Patient is most comfortable and desires to start [place] . Advised to use pulse oximetry at home to monitor sa02 to ensure > 90%. Provided rx for tussionex. I looked up patient on [place] Controlled Substances Reporting System PMP AWARE and saw no activity that raised concern of inappropriate use.  He will let me know how he is doing.

## 2021-03-11 NOTE — Progress Notes (Signed)
Virtual Visit via Video Note  I connected with@  on 03/11/21 at 11:30 AM EST by a video enabled telemedicine application and verified that I am speaking with the correct person using two identifiers.  Location patient: home Location provider:work  Persons participating in the virtual visit: patient, provider  I discussed the limitations of evaluation and management by telemedicine and the availability of in person appointments. The patient expressed understanding and agreed to proceed.   HPI: Acute visit Complains of nasal congestion, cough, chills, body aches x 4 days ago Covid for positive 2 days ago. Endorses tactile warmth. He will feel sob on occasion with coughing, or bad coughing spell. It is self limiting. No CP, leg swelling.  Appetite decreased.He is drinking water .   He stopped smoking when he got sick.   He has yet to start ozempic to 63m. He did start decreased basaglar 38 units,metformin 20069m.  Post prandial yesterday 180 and today 85. Breakfast was half a breakfast burrito. He hasnt checked otherwise.   No hypoglycemic episodes  HTN- he is has not picked up losartan 12.61m43m he has been compliant amlodipine 10 mg, hydrochlorothiazide 12.5 mg  ROS: See pertinent positives and negatives per HPI.    EXAM:  VITALS per patient if applicable: Ht 6' (1.87.672    BMI 31.22 kg/m  BP Readings from Last 3 Encounters:  03/08/21 124/84  10/18/20 (!) 166/121  09/20/20 138/71   Wt Readings from Last 3 Encounters:  03/08/21 230 lb 3.2 oz (104.4 kg)  10/18/20 241 lb (109.3 kg)  09/20/20 241 lb (109.3 kg)    GENERAL: alert, oriented, appears well and in no acute distress  HEENT: atraumatic, conjunttiva clear, no obvious abnormalities on inspection of external nose and ears  NECK: normal movements of the head and neck  LUNGS: on inspection no signs of respiratory distress, breathing rate appears normal, no obvious gross SOB, gasping or wheezing  CV: no obvious  cyanosis  MS: moves all visible extremities without noticeable abnormality  PSYCH/NEURO: pleasant and cooperative, no obvious depression or anxiety, speech and thought processing grossly intact  ASSESSMENT AND PLAN:  Discussed the following assessment and plan:  Problem List Items Addressed This Visit       Endocrine   Type 2 diabetes mellitus with diabetic neuropathy, unspecified (HCC)    Decreased appetite. Blood sugars lower for him than typical for him. Emphasized the importance of checking blood glucose more often as may run higher, or lower, than normal. Advised to go ahead and decrease basaglar from 38 units to 30 units due to decreased appetite. He will increase to ozempic 2mg66mce he is feeling better. Continue metformin 1000mg62m        Other   COVID-19 - Primary    Patient well appearing and in no respiratory distress. Discussed molnupiravir.I have counseled on lacking long term safely and effectiveness data of medication,molnupiravir.  Explained EUA for molnupiravir. Criteria met for consideration of  Molnupiravir :  covid positive, patient older than 18 ye26s old, started within 5 days of symptom onset and risk factor for severe disease include: DM, HTN  Counseled on adverse effects including nausea, dizziness, diarrhea.  Patient is most comfortable and desires to start MolnuLaconiavised to use pulse oximetry at home to monitor sa02 to ensure > 90%. Provided rx for tussionex. I looked up patient on [place] Controlled Substances Reporting System PMP AWARE and saw no activity that raised concern of inappropriate use.  He  will let me know how he is doing.        Relevant Medications   molnupiravir EUA (LAGEVRIO) 200 mg CAPS capsule   chlorpheniramine-HYDROcodone (TUSSIONEX PENNKINETIC ER) 10-8 MG/5ML    -we discussed possible serious and likely etiologies, options for evaluation and workup, limitations of telemedicine visit vs in person visit, treatment, treatment risks  and precautions. Pt prefers to treat via telemedicine empirically rather then risking or undertaking an in person visit at this moment.  .   I discussed the assessment and treatment plan with the patient. The patient was provided an opportunity to ask questions and all were answered. The patient agreed with the plan and demonstrated an understanding of the instructions.   The patient was advised to call back or seek an in-person evaluation if the symptoms worsen or if the condition fails to improve as anticipated.   Mable Paris, FNP

## 2021-03-11 NOTE — Patient Instructions (Addendum)
Please use pulse oximetry at home to monitor sa02 to ensure > 90%.  If any point you develop further shortness of breath or oxygen dropped to less than 90%, please call directly to emergency room  Very important to check blood sugar fasting and after your largest meal. Decrease basaglar to 30 units for now and we will stay touch.   Please take cough medication ( tussionex) at night only as needed. As we discussed, I do not recommend dosing throughout the day as coughing is a protective mechanism . It also helps to break up thick mucous.  Do not take cough suppressants with alcohol as can lead to trouble breathing. Advise caution if taking cough suppressant and operating machinery ( i.e driving a car) as you may feel very tired.    You may start PLAIN Mucinex ( guaifenesin) which you can help break up thick congestion.  Please ensure you are drinking plenty of water with this medication.  Please stay in quarantine per cdc guidelines.   If you test positive for COVID-19, stay home for at least 5 days and isolate from others in your home. You are likely most infectious during these first 5 days. Wear a high-quality mask if you must be around others at home and in public. Do not go places where you are unable to wear a mask.   We discussed starting Molnupiravir which is an unapproved drug that is authorized for use under an Emergency Use Authorization.  There are no adequate, approved, available products for the treatment of COVID-19 in adults who have mild-to-moderate COVID-19 and are at high risk for progressing to severe COVID-19, including hospitalization or death.  I have sent  Molnupiravir to your pharmacy. Please call pharmacy so they bring medication out to your car and you do not have to go inside.   This medication is not recommended in pregnancy.    COMMON SIDE EFFECTS: Diarrhea Nausea dizziness   If your COVID-19 symptoms get worse, get medical help right away. Call 911 if you  experience symptoms such as worsening cough, trouble breathing, chest pain that doesn't go away, confusion, a hard time staying awake, and pale or blue-colored skin.This medication won't prevent all COVID-19 cases from getting worse.   Molnupiravir Oral Capsules What is this medication? MOLNUPIRAVIR (mol nue pir a vir) treats COVID-19. It is an antiviral medication. It may decrease the risk of developing severe symptoms of COVID-19. It may also decrease the chance of going to the hospital. This medication is not approved by the FDA. The FDA has authorized emergency use of thismedication during the COVID-19 pandemic. This medicine may be used for other purposes; ask your health care provider orpharmacist if you have questions. What should I tell my care team before I take this medication? They need to know if you have any of these conditions: Any allergies Any serious illness An unusual or allergic reaction to molnupiravir, other medications, foods, dyes, or preservatives Pregnant or trying to get pregnant Breast-feeding How should I use this medication? Take this medication by mouth with water. Take it as directed on the prescription label at the same time every day. Do not cut, crush or chew this medication. Swallow the capsules whole. You can take it with or without food. If it upsets your stomach, take it with food. Take all of this medication unless your care team tells you to stop it early. Keep taking it even if youthink you are better. Talk to your care team about the  use of this medication in children. Specialcare may be needed. Overdosage: If you think you have taken too much of this medicine contact apoison control center or emergency room at once. NOTE: This medicine is only for you. Do not share this medicine with others. What if I miss a dose? If you miss a dose, take it as soon as you can unless it is more than 10 hours late. If it is more than 10 hours late, skip the missed dose.  Take the next dose at the normal time. Do not take extra or 2 doses at the same time to makeup for the missed dose. What may interact with this medication? Interactions have not been studied. This list may not describe all possible interactions. Give your health care provider a list of all the medicines, herbs, non-prescription drugs, or dietary supplements you use. Also tell them if you smoke, drink alcohol, or use illegaldrugs. Some items may interact with your medicine. What should I watch for while using this medication? Your condition will be monitored carefully while you are receiving this medication. Visit your care team for regular checkups. Tell your care team ifyour symptoms do not start to get better or if they get worse. Do not become pregnant while taking this medication. You may need a pregnancy test before starting this medication. Women must use a reliable form of birth control while taking this medication and for 4 days after stopping the medication. Women should inform their care team if they wish to become pregnant or think they might be pregnant. Men should not father a child while taking this medication and for 3 months after stopping it. There is potential for serious harm to an unborn child. Talk to your care team for more information. Do not breast-feed an infant while taking this medication and for 4 days afterstopping the medication. What side effects may I notice from receiving this medication? Side effects that you should report to your care team as soon as possible: Allergic reactions-skin rash, itching, hives, swelling of the face, lips, tongue, or throat Side effects that usually do not require medical attention (report these toyour care team if they continue or are bothersome): Diarrhea Dizziness Nausea This list may not describe all possible side effects. Call your doctor for medical advice about side effects. You may report side effects to FDA at1-800-FDA-1088. Where  should I keep my medication? Keep out of the reach of children and pets. Store at room temperature between 20 and 25 degrees C (68 and 77 degrees F).Get rid of any unused medication after the expiration date. To get rid of medications that are no longer needed or have expired: Take the medication to a medication take-back program. Check with your pharmacy or law enforcement to find a location. If you cannot return the medication, check the label or package insert to see if the medication should be thrown out in the garbage or flushed down the toilet. If you are not sure, ask your care team. If it is safe to put it in the trash, take the medication out of the container. Mix the medication with cat litter, dirt, coffee grounds, or other unwanted substance. Seal the mixture in a bag or container. Put it in the trash. NOTE: This sheet is a summary. It may not cover all possible information. If you have questions about this medicine, talk to your doctor, pharmacist, orhealth care provider.  2022 Elsevier/Gold Standard (2020-01-23 16:16:01)

## 2021-03-11 NOTE — Telephone Encounter (Signed)
LM for Ronald Pruitt at [facility] who does the scheduling for the lung cancer screening program to call back.

## 2021-03-11 NOTE — Assessment & Plan Note (Signed)
Decreased appetite. Blood sugars lower for him than typical for him. Emphasized the importance of checking blood glucose more often as may run higher, or lower, than normal. Advised to go ahead and decrease basaglar from 38 units to 30 units due to decreased appetite. He will increase to ozempic 2mg  once he is feeling better. Continue metformin 1000mg  bid

## 2021-03-21 ENCOUNTER — Ambulatory Visit: Payer: 59

## 2021-03-21 ENCOUNTER — Other Ambulatory Visit: Payer: Self-pay

## 2021-03-21 ENCOUNTER — Ambulatory Visit (INDEPENDENT_AMBULATORY_CARE_PROVIDER_SITE_OTHER): Payer: 59

## 2021-03-21 DIAGNOSIS — E538 Deficiency of other specified B group vitamins: Secondary | ICD-10-CM | POA: Diagnosis not present

## 2021-03-21 MED ORDER — CYANOCOBALAMIN 1000 MCG/ML IJ SOLN
1000.0000 ug | Freq: Once | INTRAMUSCULAR | Status: AC
  Administered 2021-03-21: 1000 ug via INTRAMUSCULAR

## 2021-03-21 NOTE — Progress Notes (Signed)
Patient presented for B 12 injection to right deltoid, patient voiced no concerns nor showed any signs of distress during injection. 

## 2021-03-29 ENCOUNTER — Ambulatory Visit (INDEPENDENT_AMBULATORY_CARE_PROVIDER_SITE_OTHER): Payer: 59 | Admitting: *Deleted

## 2021-03-29 ENCOUNTER — Other Ambulatory Visit: Payer: Self-pay

## 2021-03-29 DIAGNOSIS — E538 Deficiency of other specified B group vitamins: Secondary | ICD-10-CM

## 2021-03-29 DIAGNOSIS — D649 Anemia, unspecified: Secondary | ICD-10-CM

## 2021-03-29 MED ORDER — CYANOCOBALAMIN 1000 MCG/ML IJ SOLN
1000.0000 ug | Freq: Once | INTRAMUSCULAR | Status: AC
  Administered 2021-03-29: 1000 ug via INTRAMUSCULAR

## 2021-03-29 NOTE — Progress Notes (Signed)
Pt arrived for B12 injection, given in L deltoid. Pt tolerated injection well, showed no signs of distress nor voiced any concerns.  ?

## 2021-04-02 LAB — CELIAC DISEASE AB SCREEN W/RFX
Antigliadin Abs, IgA: 3 units (ref 0–19)
IgA/Immunoglobulin A, Serum: 365 mg/dL (ref 90–386)
Transglutaminase IgA: <2 U/mL (ref 0–3)

## 2021-04-02 LAB — INTRINSIC FACTOR ANTIBODIES: Intrinsic Factor: NEGATIVE

## 2021-04-02 LAB — HOMOCYSTEINE: Homocysteine: 8.5 umol/L (ref <11.4)

## 2021-04-02 LAB — METHYLMALONIC ACID, SERUM: Methylmalonic Acid, Quant: 87 nmol/L (ref 87–318)

## 2021-04-02 LAB — ANTI-PARIETAL ANTIBODY: PARIETAL CELL AB SCREEN: NEGATIVE

## 2021-04-03 ENCOUNTER — Encounter: Payer: Self-pay | Admitting: Family

## 2021-04-03 DIAGNOSIS — E538 Deficiency of other specified B group vitamins: Secondary | ICD-10-CM | POA: Insufficient documentation

## 2021-04-04 ENCOUNTER — Encounter: Payer: Self-pay | Admitting: Family

## 2021-04-04 ENCOUNTER — Other Ambulatory Visit: Payer: Self-pay

## 2021-04-04 ENCOUNTER — Ambulatory Visit (INDEPENDENT_AMBULATORY_CARE_PROVIDER_SITE_OTHER): Payer: 59

## 2021-04-04 DIAGNOSIS — E538 Deficiency of other specified B group vitamins: Secondary | ICD-10-CM | POA: Diagnosis not present

## 2021-04-04 MED ORDER — CYANOCOBALAMIN 1000 MCG/ML IJ SOLN
1000.0000 ug | Freq: Once | INTRAMUSCULAR | Status: AC
  Administered 2021-04-04: 09:00:00 1000 ug via INTRAMUSCULAR

## 2021-04-04 NOTE — Progress Notes (Signed)
Patient presented for B 12 injection to right deltoid, patient voiced no concerns nor showed any signs of distress during injection. 

## 2021-04-11 ENCOUNTER — Ambulatory Visit (INDEPENDENT_AMBULATORY_CARE_PROVIDER_SITE_OTHER): Payer: 59

## 2021-04-11 ENCOUNTER — Other Ambulatory Visit: Payer: Self-pay

## 2021-04-11 DIAGNOSIS — E538 Deficiency of other specified B group vitamins: Secondary | ICD-10-CM

## 2021-04-11 MED ORDER — CYANOCOBALAMIN 1000 MCG/ML IJ SOLN
1000.0000 ug | Freq: Once | INTRAMUSCULAR | Status: AC
  Administered 2021-04-11: 1000 ug via INTRAMUSCULAR

## 2021-04-11 NOTE — Progress Notes (Signed)
Patient presented for B 12 injection to left deltoid, patient voiced no concerns nor showed any signs of distress during injection. 

## 2021-04-12 ENCOUNTER — Ambulatory Visit: Payer: 59

## 2021-04-15 ENCOUNTER — Other Ambulatory Visit: Payer: Self-pay | Admitting: Urology

## 2021-04-15 DIAGNOSIS — N529 Male erectile dysfunction, unspecified: Secondary | ICD-10-CM

## 2021-04-19 ENCOUNTER — Encounter: Payer: Self-pay | Admitting: Family

## 2021-04-19 ENCOUNTER — Ambulatory Visit: Payer: 59 | Admitting: Family

## 2021-04-19 ENCOUNTER — Other Ambulatory Visit: Payer: Self-pay

## 2021-04-19 DIAGNOSIS — E1140 Type 2 diabetes mellitus with diabetic neuropathy, unspecified: Secondary | ICD-10-CM | POA: Diagnosis not present

## 2021-04-19 DIAGNOSIS — D649 Anemia, unspecified: Secondary | ICD-10-CM

## 2021-04-19 DIAGNOSIS — E538 Deficiency of other specified B group vitamins: Secondary | ICD-10-CM

## 2021-04-19 DIAGNOSIS — Z794 Long term (current) use of insulin: Secondary | ICD-10-CM

## 2021-04-19 DIAGNOSIS — I10 Essential (primary) hypertension: Secondary | ICD-10-CM | POA: Diagnosis not present

## 2021-04-19 DIAGNOSIS — E785 Hyperlipidemia, unspecified: Secondary | ICD-10-CM

## 2021-04-19 LAB — CBC WITH DIFFERENTIAL/PLATELET
Basophils Absolute: 0.1 10*3/uL (ref 0.0–0.1)
Basophils Relative: 0.9 % (ref 0.0–3.0)
Eosinophils Absolute: 0.2 10*3/uL (ref 0.0–0.7)
Eosinophils Relative: 2.6 % (ref 0.0–5.0)
HCT: 43.3 % (ref 39.0–52.0)
Hemoglobin: 14.3 g/dL (ref 13.0–17.0)
Lymphocytes Relative: 17.5 % (ref 12.0–46.0)
Lymphs Abs: 1.5 10*3/uL (ref 0.7–4.0)
MCHC: 33.1 g/dL (ref 30.0–36.0)
MCV: 85.9 fl (ref 78.0–100.0)
Monocytes Absolute: 0.5 10*3/uL (ref 0.1–1.0)
Monocytes Relative: 5.7 % (ref 3.0–12.0)
Neutro Abs: 6.4 10*3/uL (ref 1.4–7.7)
Neutrophils Relative %: 73.3 % (ref 43.0–77.0)
Platelets: 215 10*3/uL (ref 150.0–400.0)
RBC: 5.04 Mil/uL (ref 4.22–5.81)
RDW: 16.7 % — ABNORMAL HIGH (ref 11.5–15.5)
WBC: 8.7 10*3/uL (ref 4.0–10.5)

## 2021-04-19 MED ORDER — SEMAGLUTIDE (2 MG/DOSE) 8 MG/3ML ~~LOC~~ SOPN: 2.0000 mg | PEN_INJECTOR | SUBCUTANEOUS | 1 refills | Status: DC

## 2021-04-19 MED ORDER — INSULIN GLARGINE-YFGN 100 UNIT/ML ~~LOC~~ SOPN: 19.0000 [IU] | PEN_INJECTOR | Freq: Every day | SUBCUTANEOUS | 1 refills | Status: DC

## 2021-04-19 NOTE — Assessment & Plan Note (Signed)
Chronic, stable.  Continue amlodipine 10 mg, hydrochlorothiazide 12.5 mg, losartan 12.'5mg'$ .  ?

## 2021-04-19 NOTE — Assessment & Plan Note (Signed)
Low iron stores. He has been compliant with ferrous sulfate 325 daily.  Pending CBC ferritin today.  Ordered occult stool. Colonoscopy UTD.  ?

## 2021-04-19 NOTE — Assessment & Plan Note (Signed)
Excellent FBG. He has started ozempic '2mg'$  this past week. He would like to finish semglee that he has at home . Stop basaglar. Start semglee 19 units which is decreased by half as we escalate regimen. Continue metformin '1000mg'$  bid ?

## 2021-04-19 NOTE — Progress Notes (Signed)
? ?Subjective:  ? ? Patient ID: Ronald Pruitt., male    DOB: Feb 17, 1970, 51 y.o.   MRN: [id number] ? ?CC: Ronald Pruitt. is a 51 y.o. male who presents today for follow up.  ? ?HPI: Feels well today.  No new complaints ? ?He has been taking ferrous sulfate once daily. No blood in stool, constipation ? ?HTN- compliant amlodipine 10 mg, hydrochlorothiazide 12.5 mg, losartan 12.45m.  ? ?DM- ozempic to 239m( started this past week) , metformin 200060mhs,basaglar 38 units. He has semglee and wants to use this next. FBG 105, 109 ,105, 113, 112.  No hypoglycemia ? ?Hyperlipidemia-compliant with atorvastatin 80 mg, Zetia 30m50m?B12 deficiency-compliant with B12 injections. Feels energy has improved.  ? ? ? ?HISTORY:  ?Past Medical History:  ?Diagnosis Date  ? Arthritis   ? BPH (benign prostatic hyperplasia)   ? Diabetes (HCC)[place]? Dysuria   ? GERD (gastroesophageal reflux disease)   ? History of kidney stones   ? HTN (hypertension)   ? Hyperlipidemia   ? Impotence   ? Nocturia   ? Obesity   ? Pneumonia age 53's26'sSleep apnea   ? CPAP - severe (sleep study 01/11/16)  ? Testicular hypofunction   ? Wears dentures   ? full upper and lower  ? ?Past Surgical History:  ?Procedure Laterality Date  ? CHOLECYSTECTOMY    ? COLONOSCOPY WITH PROPOFOL N/A 10/12/2018  ? Procedure: COLONOSCOPY WITH PROPOFOL;  Surgeon: WohlLucilla Lame;  [facility];  Service: Endoscopy;  Laterality: N/A;  ? EXTRACORPOREAL SHOCK WAVE LITHOTRIPSY Left 09/29/2019  ? Procedure: EXTRACORPOREAL SHOCK WAVE LITHOTRIPSY (ESWL);  Surgeon: BranHollice Espy;  Location: [facility];  Service: Urology;  Laterality: Left;  ? EXTRACORPOREAL SHOCK WAVE LITHOTRIPSY Left 09/20/2020  ? Procedure: EXTRACORPOREAL SHOCK WAVE LITHOTRIPSY (ESWL);  Surgeon: BranHollice Espy;  Location: [facility];  Service: Urology;  Laterality: Left;  ? NASAL SEPTOPLASTY W/ TURBINOPLASTY N/A 05/20/2017  ? Procedure: NASAL SEPTOPLASTY WITH SUBMUCOUS RESECTION;  Surgeon:  BennClyde Canterbury;  Location: [facility];  Service: ENT;  Laterality: N/A;  ? VASECTOMY    ? ?Family History  ?Problem Relation Age of Onset  ? Hyperlipidemia Mother   ? Diabetes Mellitus II Mother   ? Seizures Mother   ? Diabetes Mother   ? Mental illness Mother   ? Arthritis Mother   ? Diabetes Father   ? Cirrhosis Father   ?     non alcoholic  ? Hyperlipidemia Father   ? Arthritis Father   ? Liver cancer Father   ? Diabetes Maternal Grandmother   ? Hyperlipidemia Maternal Grandmother   ? Arthritis Maternal Grandmother   ? Diabetes Maternal Grandfather   ? Hyperlipidemia Maternal Grandfather   ? Arthritis Maternal Grandfather   ? Diabetes Paternal Grandmother   ? Hyperlipidemia Paternal Grandmother   ? Arthritis Paternal Grandmother   ? Diabetes Paternal Grandfather   ? Hyperlipidemia Paternal Grandfather   ? Arthritis Paternal Grandfather   ? Kidney disease Neg Hx   ? Prostate cancer Neg Hx   ? Kidney cancer Neg Hx   ? Bladder Cancer Neg Hx   ? Colon cancer Neg Hx   ? Thyroid cancer Neg Hx   ? ? ?Allergies: Patient has no known allergies. ?Current Outpatient Medications on File Prior to Visit  ?Medication Sig Dispense Refill  ? amLODipine (NORVASC) 10 MG tablet Take 1 tablet (10 mg total) by mouth daily. 90[place]  tablet 3  ? atorvastatin (LIPITOR) 80 MG tablet Take 1 tablet (80 mg total) by mouth daily at 6 PM. 90 tablet 1  ? Blood Glucose Monitoring Suppl (ONETOUCH VERIO) w/Device KIT 1 Device by Does not apply route daily. 1 kit 0  ? ezetimibe (ZETIA) 10 MG tablet Take 1 tablet (10 mg total) by mouth daily. 90 tablet 1  ? ferrous sulfate 325 (65 FE) MG EC tablet Take 1 tablet (325 mg total) by mouth 2 (two) times daily with a meal. 60 tablet 1  ? folic acid (FOLVITE) 1 MG tablet Take 1 tablet (1 mg total) by mouth daily. 90 tablet 3  ? gabapentin (NEURONTIN) 100 MG capsule Take 1 capsule (100 mg total) by mouth 3 (three) times daily. 270 capsule 3  ? glucose blood (ONETOUCH VERIO) test strip Used to check blood sugar  twice a day. 100 each 12  ? hydrochlorothiazide (MICROZIDE) 12.5 MG capsule Take 1 capsule (12.5 mg total) by mouth daily. 90 capsule 1  ? losartan (COZAAR) 25 MG tablet Take one half tablet (12.5 mg) by mouth daily. 90 tablet 3  ? metFORMIN (GLUCOPHAGE-XR) 500 MG 24 hr tablet TAKE 4 TABLETS EVERY EVENING 360 tablet 3  ? omeprazole (PRILOSEC) 20 MG capsule Take 1 capsule (20 mg total) by mouth daily. 90 capsule 1  ? OneTouch Delica Lancets 95K MISC Used to check blood sugars twice a day. 100 each 3  ? tadalafil (CIALIS) 20 MG tablet TAKE 1 TABLET BY MOUTH ONCE DAILY AS NEEDED FOR  ERECTILE  DYSFUNCTION  -  EFFECTS  MAY  LAST  UP  TO  36  HRS 30 tablet 0  ? tadalafil (CIALIS) 5 MG tablet Take 1 tablet (5 mg total) by mouth daily as needed for erectile dysfunction. 90 tablet 3  ? ?No current facility-administered medications on file prior to visit.  ? ? ?Social History  ? ?Tobacco Use  ? Smoking status: Every Day  ?  Packs/day: 0.50  ?  Years: 20.00  ?  Pack years: 10.00  ?  Types: Cigarettes  ? Smokeless tobacco: Never  ?Vaping Use  ? Vaping Use: Former  ?Substance Use Topics  ? Alcohol use: No  ?  Alcohol/week: 0.0 standard drinks  ? Drug use: Never  ? ? ?Review of Systems  ?Constitutional:  Negative for chills and fever.  ?Respiratory:  Negative for cough.   ?Cardiovascular:  Negative for chest pain and palpitations.  ?Gastrointestinal:  Negative for blood in stool, nausea and vomiting.  ?   ?Objective:  ?  ?BP 122/90 (BP Location: Left Arm, Patient Position: Sitting, Cuff Size: Large)   Pulse 93   Temp 97.9 ?F (36.6 ?C) (Oral)   Ht 6' (1.829 m)   Wt 223 lb 9.6 oz (101.4 kg)   SpO2 98%   BMI 30.33 kg/m?  ?BP Readings from Last 3 Encounters:  ?04/19/21 122/90  ?03/08/21 124/84  ?10/18/20 (!) 166/121  ? ?Wt Readings from Last 3 Encounters:  ?04/19/21 223 lb 9.6 oz (101.4 kg)  ?03/08/21 230 lb 3.2 oz (104.4 kg)  ?10/18/20 241 lb (109.3 kg)  ? ? ?Physical Exam ?Vitals reviewed.  ?Constitutional:   ?   Appearance:  He is well-developed.  ?Cardiovascular:  ?   Rate and Rhythm: Regular rhythm.  ?   Heart sounds: Normal heart sounds.  ?Pulmonary:  ?   Effort: Pulmonary effort is normal. No respiratory distress.  ?   Breath sounds: Normal breath sounds. No wheezing, rhonchi  or rales.  ?Skin: ?   General: Skin is warm and dry.  ?Neurological:  ?   Mental Status: He is alert.  ?Psychiatric:     ?   Speech: Speech normal.     ?   Behavior: Behavior normal.  ? ? ?   ?Assessment & Plan:  ? ?Problem List Items Addressed This Visit   ? ?  ? Cardiovascular and Mediastinum  ? HTN (hypertension)  ?  Chronic, stable.  Continue amlodipine 10 mg, hydrochlorothiazide 12.5 mg, losartan 12.51m.  ?  ?  ?  ? Endocrine  ? DM (diabetes mellitus) ([place]  ? Relevant Medications  ? Semaglutide, 2 MG/DOSE, 8 MG/3ML SOPN  ? insulin glargine-yfgn (SEMGLEE) 100 UNIT/ML Pen  ? Other Relevant Orders  ? CBC with Differential/Platelet  ? Fecal occult blood, imunochemical  ? IBC + Ferritin  ? TSH  ? Type 2 diabetes mellitus with diabetic neuropathy, unspecified ([place]  ?  Excellent FBG. He has started ozempic 221mthis past week. He would like to finish semglee that he has at home . Stop basaglar. Start semglee 19 units which is decreased by half as we escalate regimen. Continue metformin 100068mid ?  ?  ? Relevant Medications  ? Semaglutide, 2 MG/DOSE, 8 MG/3ML SOPN  ? insulin glargine-yfgn (SEMGLEE) 100 UNIT/ML Pen  ? Other Relevant Orders  ? CBC with Differential/Platelet  ? Fecal occult blood, imunochemical  ? IBC + Ferritin  ? TSH  ?  ? Other  ? Anemia  ?  Low iron stores. He has been compliant with ferrous sulfate 325 daily.  Pending CBC ferritin today.  Ordered occult stool. Colonoscopy UTD.  ?  ?  ? B12 deficiency  ?  Energy has improved.  We will continue B12 monthly for the next several months, or longer based on patient preference ?  ?  ? HLD (hyperlipidemia)  ?  LDL previously 90. Will recheck at follow up to see if < 70 ?  ?  ? ? ? ?I have  discontinued RobMerland Holnesstewart Jr.'s Bas[employer] Corporationd chlorpheniramine-HYDROcodone. I am also having him start on insulin glargine-yfgn. Additionally, I am having him maintain his OneTouch Verio, folic acid, tadalafil,

## 2021-04-19 NOTE — Patient Instructions (Addendum)
Decrease semglee to 19 units from the 38units as discussed due to my concern for low blood sugars particularly has not increased Ozempic this past week.  Please start to call me if blood sugars start to escalate we can further adjust Semglee ? ?Continue ozempic '2mg'$  ? ? ?Please return stool cards to our office ? ?

## 2021-04-19 NOTE — Assessment & Plan Note (Signed)
LDL previously 90. Will recheck at follow up to see if < 70 ?

## 2021-04-19 NOTE — Assessment & Plan Note (Signed)
Energy has improved.  We will continue B12 monthly for the next several months, or longer based on patient preference ?

## 2021-04-23 LAB — IBC + FERRITIN
Ferritin: 11.9 ng/mL — ABNORMAL LOW (ref 22.0–322.0)
Iron: 57 ug/dL (ref 42–165)
Saturation Ratios: 13.5 % — ABNORMAL LOW (ref 20.0–50.0)
TIBC: 421.4 ug/dL (ref 250.0–450.0)
Transferrin: 301 mg/dL (ref 212.0–360.0)

## 2021-04-23 LAB — TSH: TSH: 1.51 u[IU]/mL (ref 0.35–5.50)

## 2021-05-14 ENCOUNTER — Ambulatory Visit: Payer: 59

## 2021-05-15 ENCOUNTER — Ambulatory Visit (INDEPENDENT_AMBULATORY_CARE_PROVIDER_SITE_OTHER): Payer: 59 | Admitting: *Deleted

## 2021-05-15 DIAGNOSIS — E538 Deficiency of other specified B group vitamins: Secondary | ICD-10-CM

## 2021-05-15 MED ORDER — CYANOCOBALAMIN 1000 MCG/ML IJ SOLN
1000.0000 ug | Freq: Once | INTRAMUSCULAR | Status: AC
  Administered 2021-05-15: 1000 ug via INTRAMUSCULAR

## 2021-05-15 NOTE — Progress Notes (Signed)
Patient presented for B 12 injection to left deltoid, patient voiced no concerns nor showed any signs of distress during injection. 

## 2021-05-23 ENCOUNTER — Telehealth: Payer: Self-pay

## 2021-05-23 NOTE — Telephone Encounter (Signed)
LMTCB to office to go over results. 

## 2021-06-02 ENCOUNTER — Other Ambulatory Visit: Payer: Self-pay | Admitting: Family

## 2021-06-02 DIAGNOSIS — D649 Anemia, unspecified: Secondary | ICD-10-CM

## 2021-06-06 ENCOUNTER — Other Ambulatory Visit: Payer: Self-pay

## 2021-06-06 DIAGNOSIS — N529 Male erectile dysfunction, unspecified: Secondary | ICD-10-CM

## 2021-06-06 MED ORDER — TADALAFIL 20 MG PO TABS: ORAL_TABLET | ORAL | 0 refills | Status: DC

## 2021-06-06 NOTE — Telephone Encounter (Signed)
Pt requested refill on tadalafil. Refill sent.  ?

## 2021-06-18 ENCOUNTER — Ambulatory Visit: Payer: 59

## 2021-06-20 ENCOUNTER — Ambulatory Visit (INDEPENDENT_AMBULATORY_CARE_PROVIDER_SITE_OTHER): Payer: 59

## 2021-06-20 DIAGNOSIS — E538 Deficiency of other specified B group vitamins: Secondary | ICD-10-CM | POA: Diagnosis not present

## 2021-06-20 MED ORDER — CYANOCOBALAMIN 1000 MCG/ML IJ SOLN
1000.0000 ug | Freq: Once | INTRAMUSCULAR | Status: AC
  Administered 2021-06-20: 1000 ug via INTRAMUSCULAR

## 2021-06-20 NOTE — Progress Notes (Addendum)
Pt presented today for b12 injection. Right deltoid, IM. Pt voiced no concerns nor showed any signs of distress during injection.   Agree Dr. Olivia Mackie McLean-Scocuzza

## 2021-07-24 ENCOUNTER — Encounter: Payer: Self-pay | Admitting: Family

## 2021-07-24 ENCOUNTER — Ambulatory Visit: Payer: 59 | Admitting: Family

## 2021-07-24 ENCOUNTER — Ambulatory Visit (INDEPENDENT_AMBULATORY_CARE_PROVIDER_SITE_OTHER): Payer: 59

## 2021-07-24 VITALS — BP 124/78 | HR 106 | Temp 97.8°F | Ht 72.0 in | Wt 218.8 lb

## 2021-07-24 DIAGNOSIS — K219 Gastro-esophageal reflux disease without esophagitis: Secondary | ICD-10-CM | POA: Diagnosis not present

## 2021-07-24 DIAGNOSIS — I10 Essential (primary) hypertension: Secondary | ICD-10-CM | POA: Diagnosis not present

## 2021-07-24 DIAGNOSIS — E1140 Type 2 diabetes mellitus with diabetic neuropathy, unspecified: Secondary | ICD-10-CM

## 2021-07-24 DIAGNOSIS — Z794 Long term (current) use of insulin: Secondary | ICD-10-CM | POA: Diagnosis not present

## 2021-07-24 DIAGNOSIS — M549 Dorsalgia, unspecified: Secondary | ICD-10-CM | POA: Insufficient documentation

## 2021-07-24 DIAGNOSIS — E538 Deficiency of other specified B group vitamins: Secondary | ICD-10-CM | POA: Diagnosis not present

## 2021-07-24 DIAGNOSIS — M545 Low back pain, unspecified: Secondary | ICD-10-CM | POA: Diagnosis not present

## 2021-07-24 LAB — POCT GLYCOSYLATED HEMOGLOBIN (HGB A1C): Hemoglobin A1C: 6.5 % — AB (ref 4.0–5.6)

## 2021-07-24 MED ORDER — OMEPRAZOLE 20 MG PO CPDR: 20.0000 mg | DELAYED_RELEASE_CAPSULE | Freq: Every day | ORAL | 4 refills | Status: DC

## 2021-07-24 MED ORDER — EMPAGLIFLOZIN 10 MG PO TABS: 10.0000 mg | ORAL_TABLET | Freq: Every day | ORAL | 3 refills | Status: DC

## 2021-07-24 MED ORDER — CYANOCOBALAMIN 1000 MCG/ML IJ SOLN
1000.0000 ug | Freq: Once | INTRAMUSCULAR | Status: AC
  Administered 2021-07-24: 1000 ug via INTRAMUSCULAR

## 2021-07-24 NOTE — Patient Instructions (Addendum)
Stop basaglar and ferrous sulfate.   Start jardiance '10mg'$ . Monitor blood sugar.   We discussed starting a new medication called Jardiance today which protects her kidneys, prevents against cardiovascular disease and lowers your hemoglobin A1c.  It is very important in this medication that you read the below.    You should stop taking Jardiance ( SGLT2 inhibitor) and seek medical attention immediately if you have any symptoms of ketoacidosis, a serious condition in which the body produces high levels of blood acids called ketones.    Symptoms of ketoacidosis include nausea, vomiting, abdominal pain, tiredness, and trouble breathing.    You should also be alert for signs and symptoms of a urinary tract infection, such as a feeling of burning when urinating or the need to urinate often or right away; pain in the lower part of the stomach area or pelvis; fever; or blood in the urine.   Lastly, if you develop vomiting or diarrhea, such as with gastroenteritis, you should also hold Jardiance and call the office immediately.  As any volume depletion on this medication can cause ketoacidosis.  Please hold Jardiance and contact me immediately if you are experiencing any of these symptoms above.  Empagliflozin Oral Tablets What is this medication? EMPAGLIFLOZIN (EM pa gli FLOE zin) helps to treat type 2 diabetes. It helps to control blood sugar. Treatment is combined with diet and exercise. This drug may also reduce the risk of heart attack, stroke, or death if you have type 2 diabetes and risk factors for heart disease. It also treats heart failure. Itmay lower the risk for treatment of heart failure in the hospital. This medicine may be used for other purposes; ask your health care provider orpharmacist if you have questions. COMMON BRAND NAME(S): Jardiance What should I tell my care team before I take this medication? They need to know if you have any of these conditions: dehydration diabetic  ketoacidosis diet low in salt eating less due to illness, surgery, dieting, or any other reason having surgery high cholesterol high levels of potassium in the blood history of pancreatitis or pancreas problems history of yeast infection of the penis or vagina if you often drink alcohol infections in the bladder, kidneys, or urinary tract kidney disease liver disease low blood pressure on hemodialysis problems urinating type 1 diabetes uncircumcised male an unusual or allergic reaction to empagliflozin, other medicines, foods, dyes, or preservatives pregnant or trying to get pregnant breast-feeding How should I use this medication? Take this medicine by mouth with water. Take it as directed on the prescription label at the same time every day. You may take it with or without food. Keeptaking it unless your health care provider tells you to stop. A special MedGuide will be given to you by the pharmacist with eachprescription and refill. Be sure to read this information carefully each time. Talk to your health care provider about the use of this medicine in children.Special care may be needed. Overdosage: If you think you have taken too much of this medicine contact apoison control center or emergency room at once. NOTE: This medicine is only for you. Do not share this medicine with others. What if I miss a dose? If you miss a dose, take it as soon as you can. If it is almost time for yournext dose, take only that dose. Do not take double or extra doses. What may interact with this medication? alcohol diuretics insulin This list may not describe all possible interactions. Give your health care  provider a list of all the medicines, herbs, non-prescription drugs, or dietary supplements you use. Also tell them if you smoke, drink alcohol, or use illegaldrugs. Some items may interact with your medicine. What should I watch for while using this medication? Visit your health care provider  for regular checks on your progress. Tell your health care provider if your symptoms do not start to get better or if they getworse. This medicine can cause a serious condition in which there is too much acid in the blood. If you develop nausea, vomiting, stomach pain, unusual tiredness, or breathing problems, stop taking this medicine and call your doctor right away.If possible, use a ketone dipstick to check for ketones in your urine. Check with your health care provider if you have severe diarrhea, nausea, and vomiting, or if you sweat a lot. The loss of too much body fluid may make itdangerous for you to take this medicine. A test called the HbA1C (A1C) will be monitored. This is a simple blood test. It measures your blood sugar control over the last 2 to 3 months. You willreceive this test every 3 to 6 months. Learn how to check your blood sugar. Learn the symptoms of low and high bloodsugar and how to manage them. Always carry a quick-source of sugar with you in case you have symptoms of low blood sugar. Examples include hard sugar candy or glucose tablets. Make sure others know that you can choke if you eat or drink when you develop serious symptoms of low blood sugar, such as seizures or unconsciousness. Get medicalhelp at once. Tell your health care provider if you have high blood sugar. You might need to change the dose of your medicine. If you are sick or exercising more thanusual, you may need to change the dose of your medicine. What side effects may I notice from receiving this medication? Side effects that you should report to your doctor or health care professionalas soon as possible: allergic reactions (skin rash, itching or hives, swelling of the face, lips, or tongue) breathing problems dizziness feeling faint or lightheaded, falls genital infection (fever; tenderness, redness, or swelling in the genitals or area from the genitals to the back of the rectum) kidney injury (trouble  passing urine or change in the amount of urine) low blood sugar (feeling anxious; confusion; dizziness; increased hunger; unusually weak or tired; increased sweating; shakiness; cold, clammy skin; irritable; headache; blurred vision; fast heartbeat; loss of consciousness) muscle weakness nausea, vomiting, unusual stomach upset or pain new pain or tenderness, change in skin color, sores or ulcers, or infection in legs or feet penile discharge, itching, or pain unusual tiredness unusual vaginal discharge, itching, or odor urinary tract infection (fever; chills; a burning feeling when urinating; urgent need to urinate more often; blood in the urine; back pain) Side effects that usually do not require medical attention (report to yourdoctor or health care professional if they continue or are bothersome): mild increase in urination thirsty This list may not describe all possible side effects. Call your doctor for medical advice about side effects. You may report side effects to FDA at1-800-FDA-1088. Where should I keep my medication? Keep out of the reach of children and pets. Store at room temperature between 20 and 25 degrees C (68 and 77 degrees F).Get rid of any unused medicine after the expiration date. To get rid of medicines that are no longer needed or have expired: Take the medicine to a medicine take-back program. Check with your pharmacy or  law enforcement to find a location. If you cannot return the medicine, check the label or package insert to see if the medicine should be thrown out in the garbage or flushed down the toilet. If you are not sure, ask your health care provider. If it is safe to put it in the trash, take the medicine out of the container. Mix the medicine with cat litter, dirt, coffee grounds, or other unwanted substance. Seal the mixture in a bag or container. Put it in the trash. NOTE: This sheet is a summary. It may not cover all possible information. If you have  questions about this medicine, talk to your doctor, pharmacist, orhealth care provider.  2022 Elsevier/Gold Standard (2019-09-16 19:51:17)

## 2021-07-24 NOTE — Assessment & Plan Note (Addendum)
Acute on chronic. Pending xrays to evaluate for DDD versus renal stone. He may continue prn otc ibuprofen.

## 2021-07-24 NOTE — Progress Notes (Signed)
Discussed during OV. Please see OV notes

## 2021-07-24 NOTE — Progress Notes (Signed)
Poct A1C....B-12 injection

## 2021-07-24 NOTE — Assessment & Plan Note (Signed)
Chronic, stable. Continue amlodipine 10 mg, hydrochlorothiazide 12.5 mg, losartan 12.'5mg'$ 

## 2021-07-24 NOTE — Progress Notes (Signed)
Subjective:    Patient ID: Ronald Karvonen., male    DOB: Jan 14, 1971, 51 y.o.   MRN: [id number]  CC: Ronald Chisolm. is a 51 y.o. male who presents today for follow up.   HPI: Complains of midline low back pain , episodic, for a few months.  Radiates to left groin. No fever, numbness, urinary and fecal incontinence, constipation, dysuria.   More noticeable in the morning. No stiffness, injury. Ibuprofen with relief.   H/o left renal stone    HTN- compliant with amlodipine 10 mg, hydrochlorothiazide 12.5 mg, losartan 12.35m  DM- Compliant with ozempic 248m metformin 100070mID. He is no longer on semglee. Compliant with basaglar 20 units .Previously on jardiance.   He had suspected hypoglycemic episode yesterday; he didn't have his glucometer but symptoms resolved after twix bar.    b12 deficiency- he feels better on b12 monthly and would like injection today  He has not returned stool cards. No blood from rectum or seen in stool.   Past Medical History:  Diagnosis Date   Arthritis    BPH (benign prostatic hyperplasia)    Diabetes (HCC)    Dysuria    GERD (gastroesophageal reflux disease)    History of kidney stones    HTN (hypertension)    Hyperlipidemia    Impotence    Nocturia    Obesity    Pneumonia age 5's   Sleep apnea    CPAP - severe (sleep study 01/11/16)   Testicular hypofunction    Wears dentures    full upper and lower   Past Surgical History:  Procedure Laterality Date   CHOLECYSTECTOMY     COLONOSCOPY WITH PROPOFOL N/A 10/12/2018   Procedure: COLONOSCOPY WITH PROPOFOL;  Surgeon: WohLucilla LameD;  Location: [facility];  Service: Endoscopy;  Laterality: N/A;   EXTRACORPOREAL SHOCK WAVE LITHOTRIPSY Left 09/29/2019   Procedure: EXTRACORPOREAL SHOCK WAVE LITHOTRIPSY (ESWL);  Surgeon: BraHollice EspyD;  Location: [facility];  Service: Urology;  Laterality: Left;   EXTRACORPOREAL SHOCK WAVE LITHOTRIPSY Left 09/20/2020   Procedure:  EXTRACORPOREAL SHOCK WAVE LITHOTRIPSY (ESWL);  Surgeon: BraHollice EspyD;  Location: [facility];  Service: Urology;  Laterality: Left;   NASAL SEPTOPLASTY W/ TURBINOPLASTY N/A 05/20/2017   Procedure: NASAL SEPTOPLASTY WITH SUBMUCOUS RESECTION;  Surgeon: BenClyde CanterburyD;  Location: [facility];  Service: ENT;  Laterality: N/A;   VASECTOMY     Family History  Problem Relation Age of Onset   Hyperlipidemia Mother    Diabetes Mellitus II Mother    Seizures Mother    Diabetes Mother    Mental illness Mother    Arthritis Mother    Diabetes Father    Cirrhosis Father        non alcoholic   Hyperlipidemia Father    Arthritis Father    Liver cancer Father    Diabetes Maternal Grandmother    Hyperlipidemia Maternal Grandmother    Arthritis Maternal Grandmother    Diabetes Maternal Grandfather    Hyperlipidemia Maternal Grandfather    Arthritis Maternal Grandfather    Diabetes Paternal Grandmother    Hyperlipidemia Paternal Grandmother    Arthritis Paternal Grandmother    Diabetes Paternal Grandfather    Hyperlipidemia Paternal Grandfather    Arthritis Paternal Grandfather    Kidney disease Neg Hx    Prostate cancer Neg Hx    Kidney cancer Neg Hx    Bladder Cancer Neg Hx    Colon cancer Neg Hx    Thyroid  cancer Neg Hx     Allergies: Patient has no known allergies. Current Outpatient Medications on File Prior to Visit  Medication Sig Dispense Refill   amLODipine (NORVASC) 10 MG tablet Take 1 tablet (10 mg total) by mouth daily. 90 tablet 3   atorvastatin (LIPITOR) 80 MG tablet Take 1 tablet (80 mg total) by mouth daily at 6 PM. 90 tablet 1   Blood Glucose Monitoring Suppl (ONETOUCH VERIO) w/Device KIT 1 Device by Does not apply route daily. 1 kit 0   ezetimibe (ZETIA) 10 MG tablet Take 1 tablet (10 mg total) by mouth daily. 90 tablet 1   folic acid (FOLVITE) 1 MG tablet Take 1 tablet (1 mg total) by mouth daily. 90 tablet 3   gabapentin (NEURONTIN) 100 MG capsule Take 1 capsule (100  mg total) by mouth 3 (three) times daily. 270 capsule 3   glucose blood (ONETOUCH VERIO) test strip Used to check blood sugar twice a day. 100 each 12   hydrochlorothiazide (MICROZIDE) 12.5 MG capsule Take 1 capsule (12.5 mg total) by mouth daily. 90 capsule 1   losartan (COZAAR) 25 MG tablet Take one half tablet (12.5 mg) by mouth daily. 90 tablet 3   metFORMIN (GLUCOPHAGE-XR) 500 MG 24 hr tablet TAKE 4 TABLETS EVERY EVENING 360 tablet 3   OneTouch Delica Lancets 82M MISC Used to check blood sugars twice a day. 100 each 3   Semaglutide, 2 MG/DOSE, 8 MG/3ML SOPN Inject 2 mg as directed once a week. 9 mL 1   tadalafil (CIALIS) 20 MG tablet TAKE 1 TABLET BY MOUTH ONCE DAILY AS NEEDED FOR  ERECTILE  DYSFUNCTION  -  EFFECTS  MAY  LAST  UP  TO  36  HRS 30 tablet 0   tadalafil (CIALIS) 5 MG tablet Take 1 tablet (5 mg total) by mouth daily as needed for erectile dysfunction. 90 tablet 3   No current facility-administered medications on file prior to visit.    Social History   Tobacco Use   Smoking status: Every Day    Packs/day: 0.50    Years: 20.00    Total pack years: 10.00    Types: Cigarettes   Smokeless tobacco: Never  Vaping Use   Vaping Use: Former  Substance Use Topics   Alcohol use: No    Alcohol/week: 0.0 standard drinks of alcohol   Drug use: Never    Review of Systems  Constitutional:  Negative for chills and fever.  Respiratory:  Negative for cough.   Cardiovascular:  Negative for chest pain and palpitations.  Gastrointestinal:  Negative for nausea and vomiting.  Musculoskeletal:  Positive for back pain.  Neurological:  Negative for numbness.      Objective:    BP 124/78 (BP Location: Left Arm, Patient Position: Sitting, Cuff Size: Normal)   Pulse (!) 106   Temp 97.8 F (36.6 C) (Oral)   Ht 6' (1.829 m)   Wt 218 lb 12.8 oz (99.2 kg)   SpO2 97%   BMI 29.67 kg/m  BP Readings from Last 3 Encounters:  07/24/21 124/78  04/19/21 122/90  03/08/21 124/84   Wt  Readings from Last 3 Encounters:  07/24/21 218 lb 12.8 oz (99.2 kg)  04/19/21 223 lb 9.6 oz (101.4 kg)  03/08/21 230 lb 3.2 oz (104.4 kg)    Physical Exam Vitals reviewed.  Constitutional:      Appearance: He is well-developed.  Cardiovascular:     Rate and Rhythm: Regular rhythm.  Heart sounds: Normal heart sounds.  Pulmonary:     Effort: Pulmonary effort is normal. No respiratory distress.     Breath sounds: Normal breath sounds. No wheezing or rales.  Musculoskeletal:     Lumbar back: No swelling, spasms or tenderness. Normal range of motion.     Comments: Full range of motion with flexion, extension, lateral side bends. No pain, numbness, tingling elicited with single leg raise bilaterally. No rash.  Skin:    General: Skin is warm and dry.  Neurological:     Mental Status: He is alert.  Psychiatric:        Speech: Speech normal.        Behavior: Behavior normal.        Assessment & Plan:   Problem List Items Addressed This Visit       Cardiovascular and Mediastinum   HTN (hypertension) - Primary    Chronic, stable. Continue amlodipine 10 mg, hydrochlorothiazide 12.5 mg, losartan 12.45m      Relevant Orders   POCT HgB A1C (Completed)     Endocrine   Type 2 diabetes mellitus with diabetic neuropathy, unspecified ([facility]    Lab Results  Component Value Date   HGBA1C 6.5 (A) 07/24/2021  Excellent control.  Previously on Jardiance.  We have opted to discontinue Basaglar 20 units and start Jardiance 10 mg and titrate.  He will continue Ozempic 2 mg, metformin 1000 mg twice daily. He will monitor glucose closely as have discontinued insulin.      Relevant Medications   empagliflozin (JARDIANCE) 10 MG TABS tablet   Other Relevant Orders   Lipid panel   IBC + Ferritin     Other   B12 deficiency   Relevant Orders   IBC + Ferritin   B12 and Folate Panel   Low back pain    Acute on chronic. Pending xrays to evaluate for DDD versus renal stone. He may continue  prn otc ibuprofen.       Relevant Orders   DG Hip Unilat W OR W/O Pelvis 2-3 Views Left   DG Lumbar Spine Complete   DG Thoracic Spine W/Swimmers   DG Abd 1 View   Other Visit Diagnoses     Gastroesophageal reflux disease       Relevant Medications   omeprazole (PRILOSEC) 20 MG capsule        I have discontinued ROkey Regal Durden Jr.'s insulin glargine-yfgn and ferrous sulfate. I am also having him start on empagliflozin. Additionally, I am having him maintain his OneTouch Verio, folic acid, tadalafil, metFORMIN, hydrochlorothiazide, gabapentin, ezetimibe, amLODipine, OneTouch Verio, OneTouch Delica Lancets 378L atorvastatin, losartan, Semaglutide (2 MG/DOSE), tadalafil, and omeprazole. We administered cyanocobalamin.   Meds ordered this encounter  Medications   empagliflozin (JARDIANCE) 10 MG TABS tablet    Sig: Take 1 tablet (10 mg total) by mouth daily before breakfast.    Dispense:  90 tablet    Refill:  3    Order Specific Question:   Supervising Provider    Answer:   TCrecencio Mc[[id number]]   omeprazole (PRILOSEC) 20 MG capsule    Sig: Take 1 capsule (20 mg total) by mouth daily.    Dispense:  90 capsule    Refill:  4    Order Specific Question:   Supervising Provider    Answer:   TDeborra MedinaL [[id number]]   cyanocobalamin ((VITAMIN B-12)) injection 1,000 mcg    Return precautions given.   Risks, benefits, and alternatives of the  medications and treatment plan prescribed today were discussed, and patient expressed understanding.   Education regarding symptom management and diagnosis given to patient on AVS.  Continue to follow with Burnard Hawthorne, FNP for routine health maintenance.   Ronald Karvonen. and I agreed with plan.   Mable Paris, FNP

## 2021-07-24 NOTE — Assessment & Plan Note (Signed)
Lab Results  Component Value Date   HGBA1C 6.5 (A) 07/24/2021  Excellent control.  Previously on Jardiance.  We have opted to discontinue Basaglar 20 units and start Jardiance 10 mg and titrate.  He will continue Ozempic 2 mg, metformin 1000 mg twice daily. He will monitor glucose closely as have discontinued insulin.

## 2021-07-26 ENCOUNTER — Encounter: Payer: Self-pay | Admitting: Family

## 2021-07-26 DIAGNOSIS — I700 Atherosclerosis of aorta: Secondary | ICD-10-CM | POA: Insufficient documentation

## 2021-08-01 ENCOUNTER — Telehealth: Payer: Self-pay

## 2021-08-01 NOTE — Telephone Encounter (Signed)
LVM  call back to office to get results

## 2021-08-05 ENCOUNTER — Telehealth: Payer: Self-pay

## 2021-08-05 NOTE — Telephone Encounter (Signed)
LVMTCB to go over patients results

## 2021-08-19 NOTE — Progress Notes (Unsigned)
08/20/21 9:54 AM   Ronald Pruitt. 1970/12/13 [id number]  Referring provider:  Burnard Hawthorne, FNP [address] [place],  [place] [address]  Urological history  Testosterone deficiency  -contributing factors of age, diabetes and obesity -Testosterone level normalized without treatment   ED  -contributing factors of age, BPH, testosterone deficiency, DM, HTN, HLD, sleep apnea, and stress -SHIM 14 -tadalafil 20 mg, on-demand dosing  3. BPH with LUTS  - PSA pending - I PSS 10/5 - tadalafil 5 mg daily  4. Nephrolithiasis  -Stone composition 80% calcium oxalate monohydrate and 20% calcium oxalate dihydrate - S/p ESWL left renal stone 2021 - S/p ESWL left renal stone 09/20/2020 -KUB, 06/2021 - stable 12 mm LEFT renal calculus.  HPI: Ronald Pruitt. is a 51 y.o.male, who presents today for one year follow up.    He states for the last few months he has been having lower back pain which seems to be more concentrated in the left flank area which radiates to the left waist.  He states his pain is worse in the morning and he takes Tylenol and is improved as the day progresses.  KUB taken in June noted that the left renal calculus remained unchanged.  He also had x-rays of his thoracic and lumbar spine which noted degenerative changes in both areas.  He is having issues with straining to void when he is having bowel movements otherwise he has no complaints.  He does notice that his urinary symptoms are improving along with the improvement in his blood sugar levels.  He is taking tadalafil 5 mg daily.  Patient denies any modifying or aggravating factors.  Patient denies any gross hematuria, dysuria or suprapubic/flank pain.  Patient denies any fevers, chills, nausea or vomiting.     IPSS     Row Name 08/20/21 0900         International Prostate Symptom Score   How often have you had the sensation of not emptying your bladder? Less than half the time     How  often have you had to urinate less than every two hours? Less than 1 in 5 times     How often have you found you stopped and started again several times when you urinated? Not at All     How often have you found it difficult to postpone urination? Not at All     How often have you had a weak urinary stream? About half the time     How often have you had to strain to start urination? About half the time     How many times did you typically get up at night to urinate? 1 Time     Total IPSS Score 10       Quality of Life due to urinary symptoms   If you were to spend the rest of your life with your urinary condition just the way it is now how would you feel about that? Unhappy              Score:  1-7 Mild 8-19 Moderate 20-35 Severe   Patient is not having spontaneous erections.  He denies any pain or curvature with erections.  He is having good results with tadalafil 20 mg on-demand dosing.   SHIM     Row Name 08/20/21 0919         SHIM: Over the last 6 months:   How do you rate your confidence that you  could get and keep an erection? Low     When you had erections with sexual stimulation, how often were your erections hard enough for penetration (entering your partner)? A Few Times (much less than half the time)     During sexual intercourse, how often were you able to maintain your erection after you had penetrated (entered) your partner? Sometimes (about half the time)     During sexual intercourse, how difficult was it to maintain your erection to completion of intercourse? Slightly Difficult     When you attempted sexual intercourse, how often was it satisfactory for you? Sometimes (about half the time)       SHIM Total Score   SHIM 14              Score: 1-7 Severe ED 8-11 Moderate ED 12-16 Mild-Moderate ED 17-21 Mild ED 22-25 No ED   PMH: Past Medical History:  Diagnosis Date   Arthritis    BPH (benign prostatic hyperplasia)    Diabetes (HCC)    Dysuria     GERD (gastroesophageal reflux disease)    History of kidney stones    HTN (hypertension)    Hyperlipidemia    Impotence    Nocturia    Obesity    Pneumonia age 27's   Sleep apnea    CPAP - severe (sleep study 01/11/16)   Testicular hypofunction    Wears dentures    full upper and lower    Surgical History: Past Surgical History:  Procedure Laterality Date   CHOLECYSTECTOMY     COLONOSCOPY WITH PROPOFOL N/A 10/12/2018   Procedure: COLONOSCOPY WITH PROPOFOL;  Surgeon: Lucilla Lame, MD;  Location: [facility];  Service: Endoscopy;  Laterality: N/A;   EXTRACORPOREAL SHOCK WAVE LITHOTRIPSY Left 09/29/2019   Procedure: EXTRACORPOREAL SHOCK WAVE LITHOTRIPSY (ESWL);  Surgeon: Hollice Espy, MD;  Location: [facility];  Service: Urology;  Laterality: Left;   EXTRACORPOREAL SHOCK WAVE LITHOTRIPSY Left 09/20/2020   Procedure: EXTRACORPOREAL SHOCK WAVE LITHOTRIPSY (ESWL);  Surgeon: Hollice Espy, MD;  Location: [facility];  Service: Urology;  Laterality: Left;   NASAL SEPTOPLASTY W/ TURBINOPLASTY N/A 05/20/2017   Procedure: NASAL SEPTOPLASTY WITH SUBMUCOUS RESECTION;  Surgeon: Clyde Canterbury, MD;  Location: [facility];  Service: ENT;  Laterality: N/A;   VASECTOMY      Home Medications:  Allergies as of 08/20/2021   No Known Allergies      Medication List        Accurate as of August 20, 2021  9:54 AM. If you have any questions, ask your nurse or doctor.          amLODipine 10 MG tablet Commonly known as: NORVASC Take 1 tablet (10 mg total) by mouth daily.   atorvastatin 80 MG tablet Commonly known as: LIPITOR Take 1 tablet (80 mg total) by mouth daily at 6 PM.   empagliflozin 10 MG Tabs tablet Commonly known as: Jardiance Take 1 tablet (10 mg total) by mouth daily before breakfast.   ezetimibe 10 MG tablet Commonly known as: ZETIA Take 1 tablet (10 mg total) by mouth daily.   folic acid 1 MG tablet Commonly known as: FOLVITE Take 1 tablet (1 mg total) by mouth daily.    gabapentin 100 MG capsule Commonly known as: NEURONTIN Take 1 capsule (100 mg total) by mouth 3 (three) times daily.   hydrochlorothiazide 12.5 MG capsule Commonly known as: MICROZIDE Take 1 capsule (12.5 mg total) by mouth daily.   losartan 25 MG tablet Commonly known  as: COZAAR Take one half tablet (12.5 mg) by mouth daily.   metFORMIN 500 MG 24 hr tablet Commonly known as: GLUCOPHAGE-XR TAKE 4 TABLETS EVERY EVENING   omeprazole 20 MG capsule Commonly known as: PRILOSEC Take 1 capsule (20 mg total) by mouth daily.   OneTouch Delica Lancets 36U Misc Used to check blood sugars twice a day.   OneTouch Verio test strip Generic drug: glucose blood Used to check blood sugar twice a day.   OneTouch Verio w/Device Kit 1 Device by Does not apply route daily.   Semaglutide (2 MG/DOSE) 8 MG/3ML Sopn Inject 2 mg as directed once a week.   tadalafil 20 MG tablet Commonly known as: CIALIS TAKE 1 TABLET BY MOUTH ONCE DAILY AS NEEDED FOR  ERECTILE  DYSFUNCTION  -  EFFECTS  MAY  LAST  UP  TO  36  HRS   tadalafil 5 MG tablet Commonly known as: CIALIS Take 1 tablet (5 mg total) by mouth daily as needed for erectile dysfunction.        Allergies:  No Known Allergies  Family History: Family History  Problem Relation Age of Onset   Hyperlipidemia Mother    Diabetes Mellitus II Mother    Seizures Mother    Diabetes Mother    Mental illness Mother    Arthritis Mother    Diabetes Father    Cirrhosis Father        non alcoholic   Hyperlipidemia Father    Arthritis Father    Liver cancer Father    Diabetes Maternal Grandmother    Hyperlipidemia Maternal Grandmother    Arthritis Maternal Grandmother    Diabetes Maternal Grandfather    Hyperlipidemia Maternal Grandfather    Arthritis Maternal Grandfather    Diabetes Paternal Grandmother    Hyperlipidemia Paternal Grandmother    Arthritis Paternal Grandmother    Diabetes Paternal Grandfather    Hyperlipidemia Paternal  Grandfather    Arthritis Paternal Grandfather    Kidney disease Neg Hx    Prostate cancer Neg Hx    Kidney cancer Neg Hx    Bladder Cancer Neg Hx    Colon cancer Neg Hx    Thyroid cancer Neg Hx     Social History:  reports that he has been smoking cigarettes. He has a 10.00 pack-year smoking history. He has never used smokeless tobacco. He reports that he does not drink alcohol and does not use drugs.   Physical Exam: BP 136/90   Pulse 86   Ht 6' (1.829 m)   Wt 215 lb (97.5 kg)   BMI 29.16 kg/m   Constitutional:  Well nourished. Alert and oriented, No acute distress. HEENT:  AT, moist mucus membranes.  Trachea midline Cardiovascular: No clubbing, cyanosis, or edema. Respiratory: Normal respiratory effort, no increased work of breathing. GU: No CVA tenderness.  No bladder fullness or masses.  Patient with uncircumcised phallus. Foreskin easily retracted  Urethral meatus is patent.  No penile discharge. No penile lesions or rashes. Scrotum without lesions, cysts, rashes and/or edema.  Testicles are located scrotally bilaterally. No masses are appreciated in the testicles. Left and right epididymis are normal. Rectal: Patient with  normal sphincter tone. Anus and perineum without scarring or rashes. No rectal masses are appreciated. Prostate is approximately 50 grams, no nodules are appreciated. Seminal vesicles could not be palpated Neurologic: Grossly intact, no focal deficits, moving all 4 extremities. Psychiatric: Normal mood and affect.   Laboratory Data:  Hemoglobin A1C  Latest Ref Rng  4.0 - 5.6 %  07/24/2021 6.5 !     Legend: ! Abnormal  Component     Latest Ref Rng 04/19/2021  TSH     0.35 - 5.50 uIU/mL 1.51        Latest Ref Rng & Units 04/19/2021    9:12 AM 03/07/2021    8:04 AM 08/19/2019    8:06 AM  CBC  WBC 4.0 - 10.5 K/uL 8.7  6.7  7.0   Hemoglobin 13.0 - 17.0 g/dL 14.3  12.8  14.5   Hematocrit 39.0 - 52.0 % 43.3  38.5  41.9   Platelets 150.0 - 400.0 K/uL  215.0  205.0  188.0        Latest Ref Rng & Units 03/07/2021    8:04 AM 07/09/2020    2:28 PM 02/27/2020    3:30 PM  CMP  Glucose 70 - 99 mg/dL 137  234  290   BUN 6 - 23 mg/dL '12  11  9   ' Creatinine 0.40 - 1.50 mg/dL 0.89  0.93  0.97   Sodium 135 - 145 mEq/L 137  137  134   Potassium 3.5 - 5.1 mEq/L 3.8  4.4  4.3   Chloride 96 - 112 mEq/L 104  102  100   CO2 19 - 32 mEq/L '26  24  27   ' Calcium 8.4 - 10.5 mg/dL 8.9  9.5  9.8   Total Protein 6.0 - 8.3 g/dL 6.8  7.2    Total Bilirubin 0.2 - 1.2 mg/dL 0.6  0.4    Alkaline Phos 39 - 117 U/L 88  73    AST 0 - 37 U/L 21  17    ALT 0 - 53 U/L 19  25      Component     Latest Ref Rng 03/07/2021  Cholesterol     0 - 200 mg/dL 132   Triglycerides     0.0 - 149.0 mg/dL 76.0   HDL Cholesterol     >39.00 mg/dL 27.00 (L)   VLDL     0.0 - 40.0 mg/dL 15.2   Total CHOL/HDL Ratio 5   NonHDL 105.09   LDL (calc)     0 - 99 mg/dL 90     Legend: (L) Low I have reviewed the labs.     Pertinent Imaging: CLINICAL DATA:  LEFT renal calculus, follow-up   EXAM: ABDOMEN - 1 VIEW   COMPARISON:  10/18/2020   FINDINGS: 12 mm LEFT renal calculus unchanged.   Surgical clips RIGHT upper quadrant question cholecystectomy.   Normal bowel gas pattern.   Disc space narrowing L4-L5 with minimal endplate spur formation.   No acute osseous findings.   IMPRESSION: Stable 12 mm LEFT renal calculus.     Electronically Signed   By: Lavonia Dana M.D.   On: 07/25/2021 10:17 I have independently reviewed the films.  See HPI.      Assessment & Plan:    Left renal stone  - KUB in June demonstrates a stable 12 mm left renal stone  2. BPH with LUTS -PSA pending -symptoms -straining to void when having BMs -continue conservative management, avoiding bladder irritants and timed voiding's -Continue tadalafil 5 mg daily  3. ED  - Continue tadalafil 20 mg on-demand-dosing  4. Left flank pain -discussed with patient the KUB/thoracic/lumbar spine  results and how the pain presents with worse in the am and improving as the day progresses is more typical with arthritic disease versus renal  colic -I also reviewed the KUB and CT scan and stated that the stone may be contained within the actual renal parenchyma and unable to be expelled as it is not near calyx, but we would need to pursue a contrast study for further evaluation of this theory -At this time he would like to continue to manage it conservatively with Tylenol and will contact us if it worsens  Return in about 1 year (around 08/21/2022) for IPSS, SHIM, KUB, PSA and exam.  Zara Council, [facility]   [place] [address] [place], [place] [address] [phone]

## 2021-08-20 ENCOUNTER — Ambulatory Visit: Payer: 59 | Admitting: Urology

## 2021-08-20 ENCOUNTER — Encounter: Payer: Self-pay | Admitting: Urology

## 2021-08-20 VITALS — BP 136/90 | HR 86 | Ht 72.0 in | Wt 215.0 lb

## 2021-08-20 DIAGNOSIS — R10A2 Flank pain, left side: Secondary | ICD-10-CM

## 2021-08-20 DIAGNOSIS — N529 Male erectile dysfunction, unspecified: Secondary | ICD-10-CM

## 2021-08-20 DIAGNOSIS — N401 Enlarged prostate with lower urinary tract symptoms: Secondary | ICD-10-CM

## 2021-08-20 DIAGNOSIS — R109 Unspecified abdominal pain: Secondary | ICD-10-CM

## 2021-08-20 DIAGNOSIS — N138 Other obstructive and reflux uropathy: Secondary | ICD-10-CM

## 2021-08-20 DIAGNOSIS — N200 Calculus of kidney: Secondary | ICD-10-CM | POA: Diagnosis not present

## 2021-08-20 MED ORDER — TADALAFIL 5 MG PO TABS: 5.0000 mg | ORAL_TABLET | Freq: Every day | ORAL | 3 refills | Status: DC | PRN

## 2021-08-20 MED ORDER — TADALAFIL 20 MG PO TABS: ORAL_TABLET | ORAL | 3 refills | Status: DC

## 2021-08-21 ENCOUNTER — Telehealth: Payer: Self-pay

## 2021-08-21 LAB — PSA: Prostate Specific Ag, Serum: 0.3 ng/mL (ref 0.0–4.0)

## 2021-08-21 NOTE — Telephone Encounter (Signed)
Pt notified via mychart

## 2021-08-21 NOTE — Telephone Encounter (Signed)
-----   Message from Nori Riis, PA-C sent at 08/21/2021  8:02 AM EDT ----- Please let Mr. Pasillas know that his PSA is stable at 0.3 and we will see him next year.

## 2021-08-22 ENCOUNTER — Other Ambulatory Visit: Payer: Self-pay

## 2021-08-22 ENCOUNTER — Encounter: Payer: Self-pay | Admitting: Emergency Medicine

## 2021-08-22 ENCOUNTER — Emergency Department
Admission: EM | Admit: 2021-08-22 | Discharge: 2021-08-22 | Disposition: A | Discharge status: Home / Self Care | Payer: 59 | Attending: Emergency Medicine | Admitting: Emergency Medicine

## 2021-08-22 DIAGNOSIS — S060XAA Concussion with loss of consciousness status unknown, initial encounter: Secondary | ICD-10-CM | POA: Diagnosis not present

## 2021-08-22 DIAGNOSIS — S161XXA Strain of muscle, fascia and tendon at neck level, initial encounter: Secondary | ICD-10-CM | POA: Insufficient documentation

## 2021-08-22 DIAGNOSIS — Y92410 Unspecified street and highway as the place of occurrence of the external cause: Secondary | ICD-10-CM | POA: Diagnosis not present

## 2021-08-22 DIAGNOSIS — S0990XA Unspecified injury of head, initial encounter: Secondary | ICD-10-CM | POA: Diagnosis present

## 2021-08-22 DIAGNOSIS — I10 Essential (primary) hypertension: Secondary | ICD-10-CM | POA: Diagnosis not present
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
## 2021-08-23 IMAGING — CR DG ABDOMEN 1V
1 series · 2 of 2 positions shown · non-contrast
Comparison: Radiograph 10/21/2019.  CT 09/12/2019

CLINICAL DATA: Left-sided kidney stone.  Lithotripsy 1 year ago.

EXAM:
ABDOMEN - 1 VIEW

[Series 1: dg abd 1 view · 0.14mm/px · 2 of 2 slices shown]
[im 1/2]
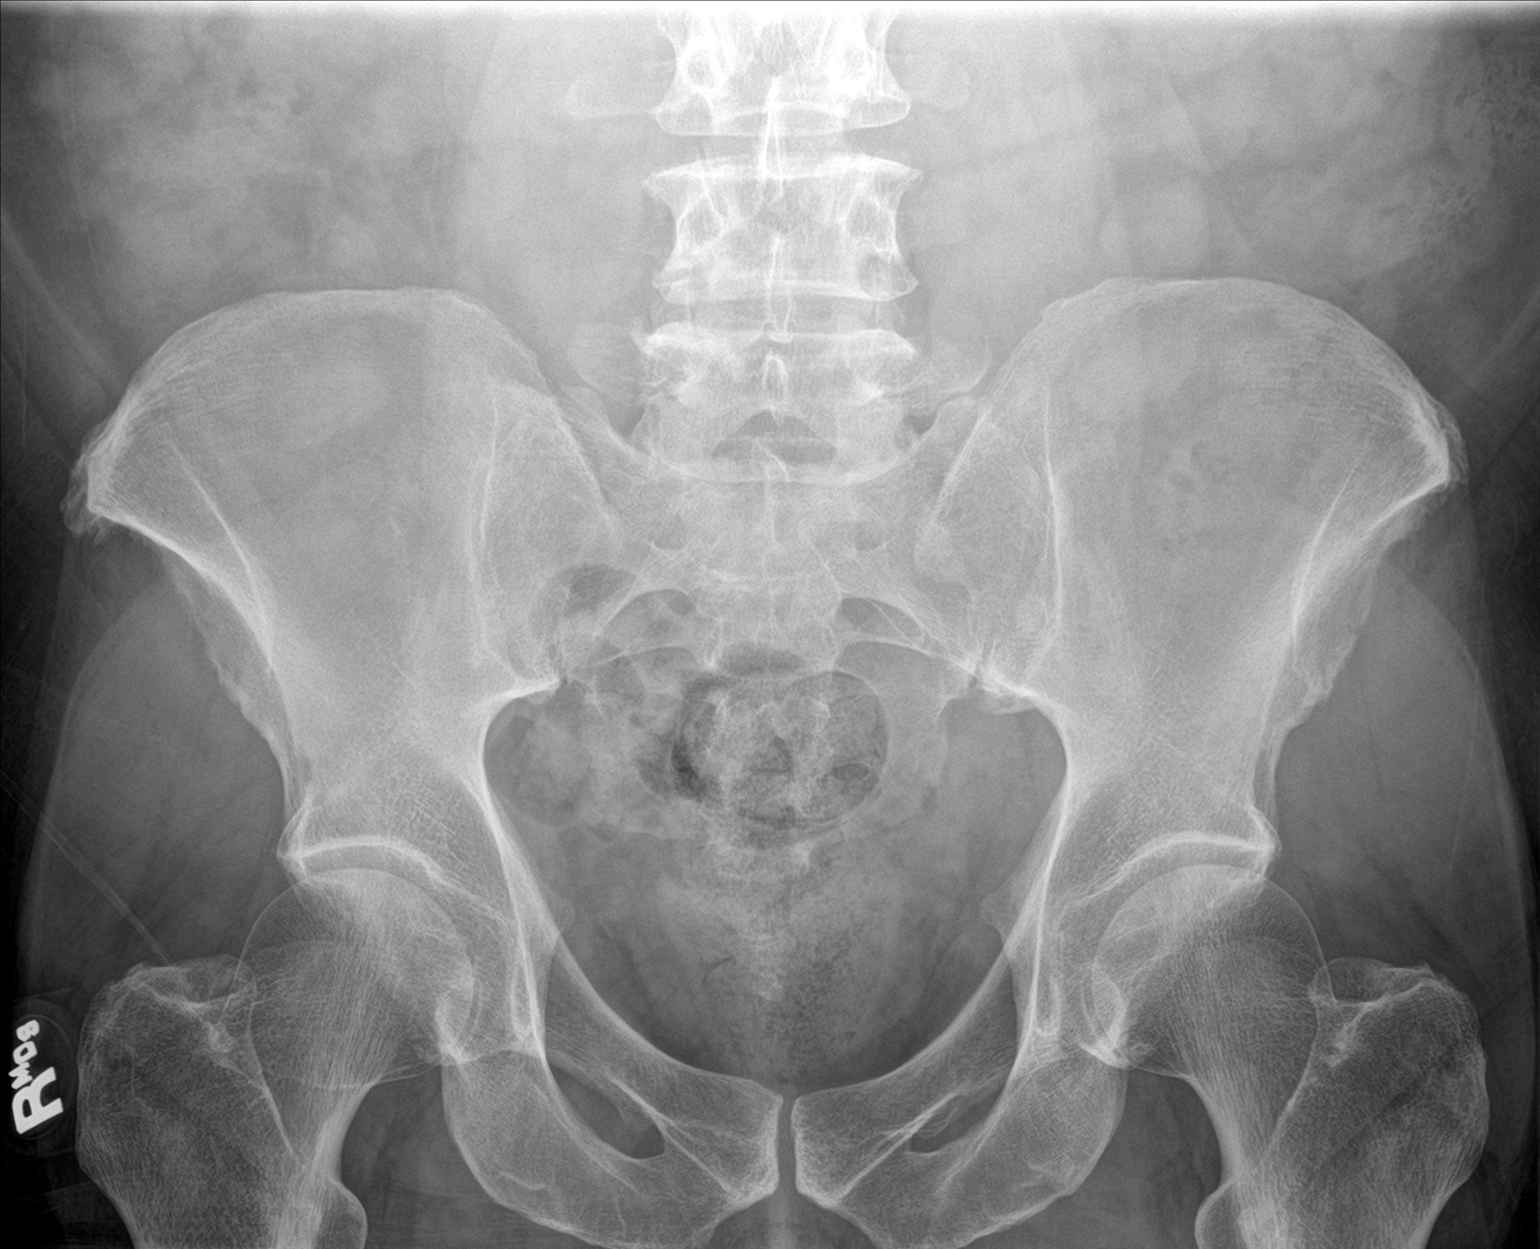
[im 2/2]
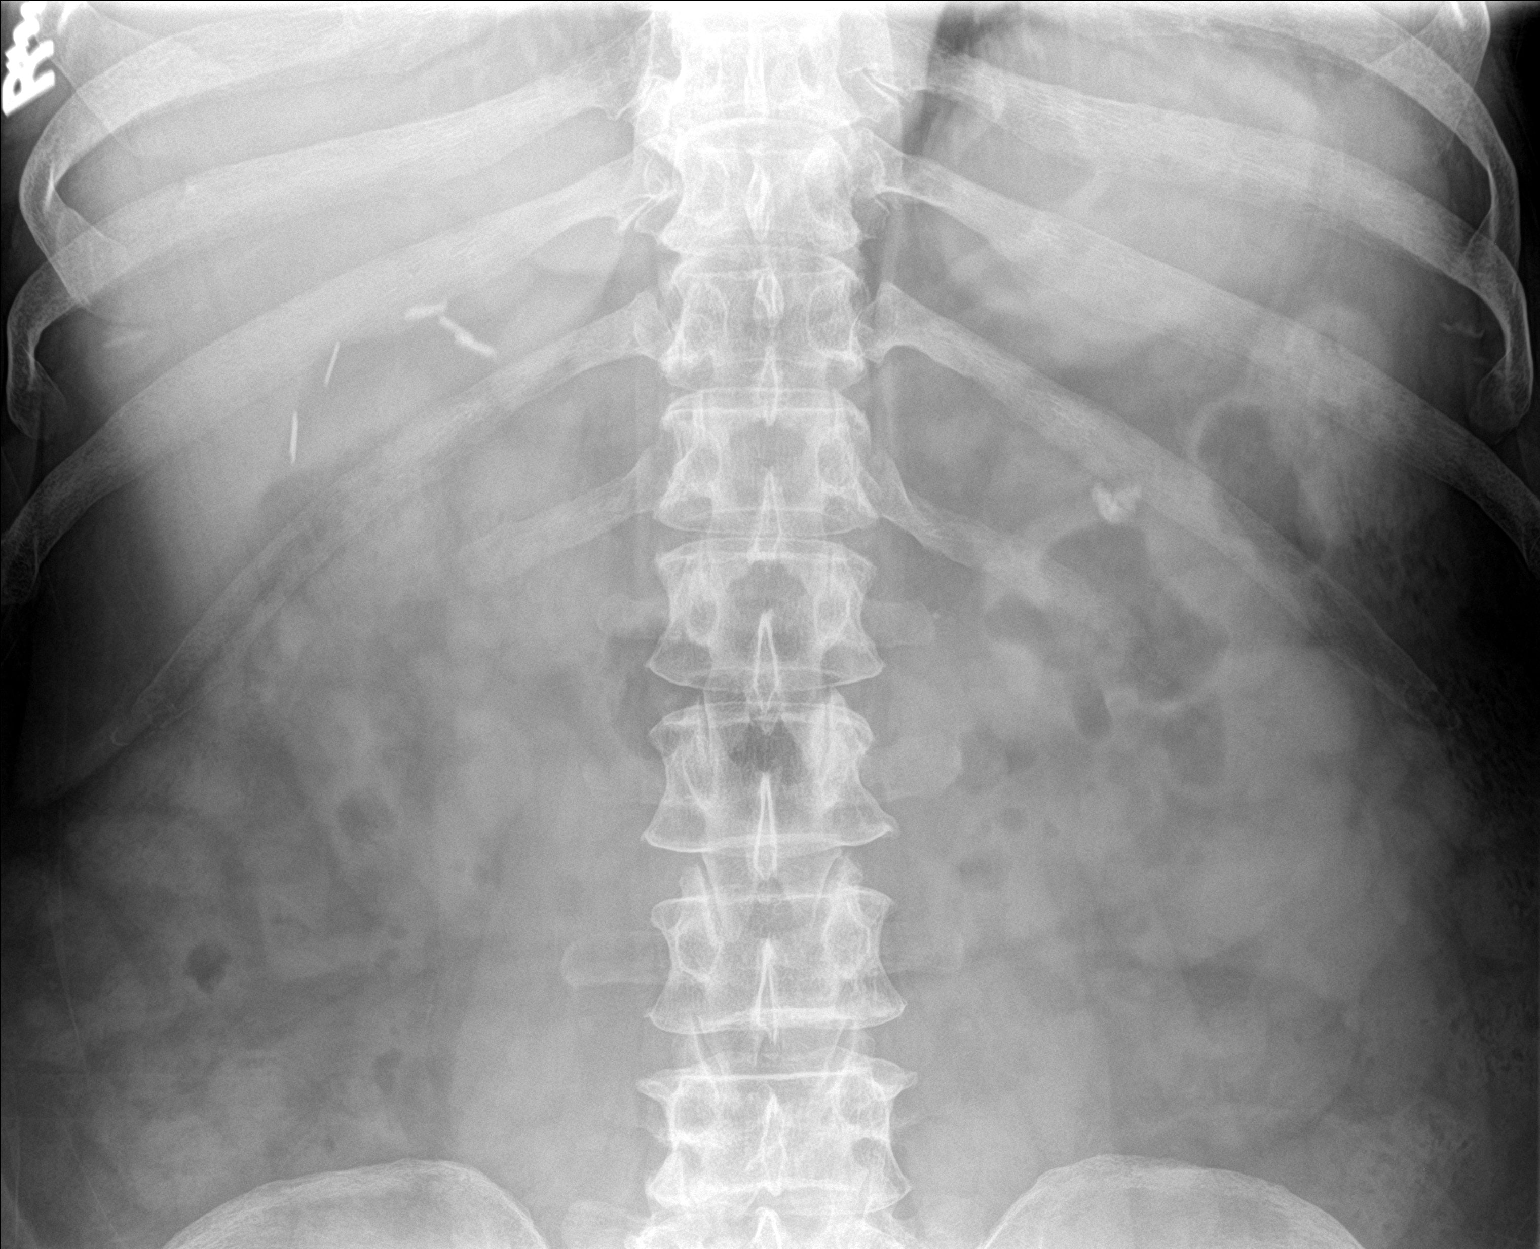

[2 of 2 positions shown; findings below may reference images not displayed]

FINDINGS: 12 mm stone or adjacent stones project over the upper left renal
shadow, not significantly changed from prior exam. No visualized
right renal calculi. No stones along the course of the ureters or in
the pelvis. Normal bowel gas pattern with small volume of colonic
stool. Cholecystectomy clips in the right upper quadrant. No acute
osseous abnormalities are seen.
IMPRESSION: Unchanged 12 mm stone or adjacent stones in the upper left kidney.

## 2021-09-04 ENCOUNTER — Other Ambulatory Visit: Payer: 59

## 2021-09-04 ENCOUNTER — Ambulatory Visit (INDEPENDENT_AMBULATORY_CARE_PROVIDER_SITE_OTHER): Payer: 59

## 2021-09-04 ENCOUNTER — Encounter: Payer: Self-pay | Admitting: Family

## 2021-09-04 ENCOUNTER — Other Ambulatory Visit: Payer: Self-pay | Admitting: Family

## 2021-09-04 DIAGNOSIS — E1140 Type 2 diabetes mellitus with diabetic neuropathy, unspecified: Secondary | ICD-10-CM

## 2021-09-04 DIAGNOSIS — E538 Deficiency of other specified B group vitamins: Secondary | ICD-10-CM | POA: Diagnosis not present

## 2021-09-04 DIAGNOSIS — Z794 Long term (current) use of insulin: Secondary | ICD-10-CM

## 2021-09-04 DIAGNOSIS — S060XAD Concussion with loss of consciousness status unknown, subsequent encounter: Secondary | ICD-10-CM

## 2021-09-04 LAB — LIPID PANEL
Cholesterol: 196 mg/dL (ref 0–200)
HDL: 28.7 mg/dL — ABNORMAL LOW (ref >39.00)
LDL Cholesterol: 137 mg/dL — ABNORMAL HIGH (ref 0–99)
NonHDL: 167.56
Total CHOL/HDL Ratio: 7
Triglycerides: 151 mg/dL — ABNORMAL HIGH (ref 0.0–149.0)
VLDL: 30.2 mg/dL (ref 0.0–40.0)

## 2021-09-04 LAB — IBC + FERRITIN
Ferritin: 7 ng/mL — ABNORMAL LOW (ref 22.0–322.0)
Iron: 67 ug/dL (ref 42–165)
Saturation Ratios: 14.3 % — ABNORMAL LOW (ref 20.0–50.0)
TIBC: 467.6 ug/dL — ABNORMAL HIGH (ref 250.0–450.0)
Transferrin: 334 mg/dL (ref 212.0–360.0)

## 2021-09-04 LAB — B12 AND FOLATE PANEL
Folate: 6.1 ng/mL (ref >5.9)
Vitamin B-12: >1500 pg/mL — ABNORMAL HIGH (ref 211–911)

## 2021-09-04 MED ORDER — CYANOCOBALAMIN 1000 MCG/ML IJ SOLN
1000.0000 ug | Freq: Once | INTRAMUSCULAR | Status: AC
  Administered 2021-09-04: 1000 ug via INTRAMUSCULAR

## 2021-09-04 NOTE — Progress Notes (Signed)
Pt arrived for B12 injection, given in L deltoid. Pt tolerated injection well, showed no signs of distress nor voiced any concerns.  ?

## 2021-09-05 NOTE — Telephone Encounter (Signed)
Called patient to check on him and he informed me that Neurology had already called him and scheduled him an appt for [facility] 09/09/21. Patient stated that he would give [place] an update after his appt with them.
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
## 2021-09-09 ENCOUNTER — Ambulatory Visit: Payer: 59 | Admitting: Family Medicine

## 2021-09-09 VITALS — BP 140/100 | HR 103 | Ht 72.0 in | Wt 216.0 lb

## 2021-09-09 DIAGNOSIS — S060X0A Concussion without loss of consciousness, initial encounter: Secondary | ICD-10-CM

## 2021-09-09 DIAGNOSIS — F430 Acute stress reaction: Secondary | ICD-10-CM | POA: Diagnosis not present

## 2021-09-09 DIAGNOSIS — G4701 Insomnia due to medical condition: Secondary | ICD-10-CM

## 2021-09-09 DIAGNOSIS — G4489 Other headache syndrome: Secondary | ICD-10-CM

## 2021-09-09 MED ORDER — NORTRIPTYLINE HCL 25 MG PO CAPS: 25.0000 mg | ORAL_CAPSULE | Freq: Every day | ORAL | 2 refills | Status: DC

## 2021-09-09 NOTE — Patient Instructions (Signed)
Thank you for coming in today.   Try nortriptyline at bedtime for headache prevention and sleep.   Try meclizine OTC for dizzy if needed.   Keep an eye on PTSD symptoms. If not going away we can start therapy for it. EMDR therapy is the most effective.   Try ear protection at work.   Let me know if things do not go OK at work tomorrow.   We can try shorter days. We can extend leave we can do other options.   Recheck in 3 weeks.   Let me know if this is not working.

## 2021-10-02 ENCOUNTER — Ambulatory Visit: Payer: 59 | Admitting: Family Medicine

## 2021-10-21 ENCOUNTER — Other Ambulatory Visit: Payer: Self-pay

## 2021-10-21 ENCOUNTER — Encounter: Payer: Self-pay | Admitting: Family

## 2021-10-21 DIAGNOSIS — E1140 Type 2 diabetes mellitus with diabetic neuropathy, unspecified: Secondary | ICD-10-CM

## 2021-10-21 MED ORDER — SEMAGLUTIDE (2 MG/DOSE) 8 MG/3ML ~~LOC~~ SOPN: 2.0000 mg | PEN_INJECTOR | SUBCUTANEOUS | 1 refills | Status: DC
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
## 2021-10-24 ENCOUNTER — Ambulatory Visit: Payer: 59 | Admitting: Family Medicine

## 2021-10-24 VITALS — BP 160/96 | HR 88 | Ht 72.0 in | Wt 214.0 lb

## 2021-10-24 DIAGNOSIS — S060X0D Concussion without loss of consciousness, subsequent encounter: Secondary | ICD-10-CM

## 2021-10-24 DIAGNOSIS — G4701 Insomnia due to medical condition: Secondary | ICD-10-CM

## 2021-10-24 DIAGNOSIS — F4310 Post-traumatic stress disorder, unspecified: Secondary | ICD-10-CM | POA: Diagnosis not present

## 2021-10-24 MED ORDER — TRAZODONE HCL 50 MG PO TABS: 50.0000 mg | ORAL_TABLET | Freq: Every day | ORAL | 1 refills | Status: DC

## 2021-10-24 NOTE — Patient Instructions (Addendum)
Thank you for coming in today.   Try trazodone for sleep as needed.   Consider EMDR for PTSD. Look up therapists for this type of therapy.   Research PTSD.   Recheck with me in 2 months.   Let me know sooner if things are going well.

## 2021-10-28 ENCOUNTER — Ambulatory Visit: Payer: 59 | Admitting: Family

## 2021-11-01 ENCOUNTER — Telehealth (INDEPENDENT_AMBULATORY_CARE_PROVIDER_SITE_OTHER): Payer: 59 | Admitting: Family

## 2021-11-01 ENCOUNTER — Other Ambulatory Visit: Payer: Self-pay | Admitting: Family

## 2021-11-01 DIAGNOSIS — E785 Hyperlipidemia, unspecified: Secondary | ICD-10-CM | POA: Diagnosis not present

## 2021-11-01 DIAGNOSIS — F419 Anxiety disorder, unspecified: Secondary | ICD-10-CM

## 2021-11-01 DIAGNOSIS — Z794 Long term (current) use of insulin: Secondary | ICD-10-CM | POA: Diagnosis not present

## 2021-11-01 DIAGNOSIS — E1140 Type 2 diabetes mellitus with diabetic neuropathy, unspecified: Secondary | ICD-10-CM | POA: Diagnosis not present

## 2021-11-01 MED ORDER — EZETIMIBE 10 MG PO TABS: 10.0000 mg | ORAL_TABLET | Freq: Every day | ORAL | 3 refills | Status: DC

## 2021-11-01 MED ORDER — FLUOXETINE HCL 20 MG PO CAPS: 20.0000 mg | ORAL_CAPSULE | Freq: Every morning | ORAL | 3 refills | Status: DC

## 2021-11-01 MED ORDER — RYBELSUS 3 MG PO TABS: 3.0000 mg | ORAL_TABLET | Freq: Every day | ORAL | 0 refills | Status: AC
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
## 2021-11-01 NOTE — Assessment & Plan Note (Addendum)
Anticipate a1c to be elevated. He cannot find ozempic. We opted to change from ozempic to rybelus due to backorder. Continue jardiance '10mg'$ . Metformin '1000mg'$  bid.

## 2021-11-04 NOTE — Patient Instructions (Addendum)
Start Prozac 20 mg.    I have sent in Rybelsus 3 mg for you to start instead of Ozempic.  Do not take Ozempic and Rybelsus at the same time as they are in the same drug class.  Rybelsus has been easier to find and so far has not been affected by back order

## 2021-11-04 NOTE — Assessment & Plan Note (Signed)
Lab Results  Component Value Date   LDLCALC 137 (H) 09/04/2021   Severely uncontrolled.  Advised patient on the importance of reducing LDL cholesterol to lower ASCVD risk.  He will resume Zetia 10 mg.  Continue Lipitor 80 mg.  We will recheck cholesterol at f/u
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
## 2021-11-19 ENCOUNTER — Encounter: Payer: Self-pay | Admitting: Family

## 2021-11-25 ENCOUNTER — Encounter (INDEPENDENT_AMBULATORY_CARE_PROVIDER_SITE_OTHER): Payer: Self-pay

## 2021-12-13 ENCOUNTER — Other Ambulatory Visit: Payer: Self-pay | Admitting: Family

## 2021-12-13 DIAGNOSIS — E1140 Type 2 diabetes mellitus with diabetic neuropathy, unspecified: Secondary | ICD-10-CM

## 2021-12-13 MED ORDER — RYBELSUS 7 MG PO TABS: 7.0000 mg | ORAL_TABLET | Freq: Every day | ORAL | 3 refills | Status: DC
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
## 2021-12-26 ENCOUNTER — Ambulatory Visit: Payer: 59 | Admitting: Family Medicine

## 2021-12-26 VITALS — BP 162/94 | HR 88 | Ht 72.0 in | Wt 217.0 lb

## 2021-12-26 DIAGNOSIS — F4310 Post-traumatic stress disorder, unspecified: Secondary | ICD-10-CM

## 2021-12-26 NOTE — Patient Instructions (Signed)
Thank you for coming in today.   Recheck as needed.   Keep an eye on your symptoms and let me or your PCP know if you need some help.

## 2022-01-03 ENCOUNTER — Telehealth: Payer: Self-pay | Admitting: Family Medicine
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
## 2022-01-06 ENCOUNTER — Encounter: Payer: Self-pay | Admitting: Family Medicine

## 2022-01-06 NOTE — Telephone Encounter (Signed)
Letter sent via mychart today

## 2022-01-07 NOTE — Telephone Encounter (Signed)
Called pt, confirmed he recd letter via [place].

## 2022-01-27 ENCOUNTER — Other Ambulatory Visit: Payer: Self-pay | Admitting: Family

## 2022-01-27 ENCOUNTER — Encounter: Payer: Self-pay | Admitting: Family

## 2022-01-27 DIAGNOSIS — I10 Essential (primary) hypertension: Secondary | ICD-10-CM

## 2022-01-27 DIAGNOSIS — E1140 Type 2 diabetes mellitus with diabetic neuropathy, unspecified: Secondary | ICD-10-CM

## 2022-01-28 ENCOUNTER — Other Ambulatory Visit: Payer: Self-pay

## 2022-01-28 DIAGNOSIS — E1140 Type 2 diabetes mellitus with diabetic neuropathy, unspecified: Secondary | ICD-10-CM

## 2022-01-28 DIAGNOSIS — I10 Essential (primary) hypertension: Secondary | ICD-10-CM

## 2022-01-28 MED ORDER — AMLODIPINE BESYLATE 10 MG PO TABS: 10.0000 mg | ORAL_TABLET | Freq: Every day | ORAL | 3 refills | Status: DC

## 2022-01-28 MED ORDER — METFORMIN HCL ER 500 MG PO TB24: ORAL_TABLET | ORAL | 3 refills | Status: DC

## 2022-04-09 NOTE — Progress Notes (Signed)
04/10/22 10:41 AM   Ronald Pruitt. [birth year] [id number]  Referring provider:  Burnard Hawthorne, FNP [address] [place],  [place] [address]  Urological history  Testosterone deficiency  -contributing factors of age, diabetes and obesity -Testosterone level normalized without treatment   ED  -contributing factors of age, BPH, testosterone deficiency, DM, HTN, HLD, sleep apnea, and stress -tadalafil 20 mg, on-demand dosing  3. BPH with LUTS  - PSA (07/2021) 0.3 - I PSS 14/5 -PVR 25 mL  - tadalafil 5 mg daily  4. Nephrolithiasis  -stone composition 80% calcium oxalate monohydrate and 20% calcium oxalate dihydrate - s/p ESWL left renal stone 2021 - s/p ESWL left renal stone 09/20/2020 -KUB (06/2021) - stable 12 mm LEFT renal calculus.  HPI: Ronald Pruitt. is a 52 y.o.male, who presents today for pain in middle of back, not a steady urine stream; doesn't feel like bladder is emptying.    KUB stable 12 mm left renal calculus  I PSS 14/5  PVR 25 mL  UA yellow clear, glucose 3+, specific gravity 1.015, pH 5.5, 0-5 WBCs and 0-10 epithelial cells.  For the last several months, he has been experiencing spraying of his urinary stream and straining to bladder.  He has also been experiencing alternating left-sided flank pain that will radiate to the left groin and right-sided flank pain that does not radiate.  This has been going on for the last several weeks.  Patient denies any modifying or aggravating factors.  Patient denies any gross hematuria, dysuria or suprapubic/flank pain.  Patient denies any fevers, chills, nausea or vomiting.      IPSS     Row Name 04/10/22 1000         International Prostate Symptom Score   How often have you had the sensation of not emptying your bladder? About half the time     How often have you had to urinate less than every two hours? Less than half the time     How often have you found you stopped and started again  several times when you urinated? Not at All     How often have you found it difficult to postpone urination? Less than 1 in 5 times     How often have you had a weak urinary stream? Less than half the time     How often have you had to strain to start urination? More than half the time     How many times did you typically get up at night to urinate? 2 Times     Total IPSS Score 14       Quality of Life due to urinary symptoms   If you were to spend the rest of your life with your urinary condition just the way it is now how would you feel about that? Unhappy              Score:  1-7 Mild 8-19 Moderate 20-35 Severe    PMH: Past Medical History:  Diagnosis Date   Arthritis    BPH (benign prostatic hyperplasia)    Diabetes (HCC)    Dysuria    GERD (gastroesophageal reflux disease)    History of kidney stones    HTN (hypertension)    Hyperlipidemia    Impotence    Nocturia    Obesity    Pneumonia age 34's   Sleep apnea    CPAP - severe (sleep study 01/11/16)   Testicular hypofunction  Wears dentures    full upper and lower    Surgical History: Past Surgical History:  Procedure Laterality Date   CHOLECYSTECTOMY     COLONOSCOPY WITH PROPOFOL N/A 10/12/2018   Procedure: COLONOSCOPY WITH PROPOFOL;  Surgeon: Lucilla Lame, MD;  Location: [facility] ENDOSCOPY;  Service: Endoscopy;  Laterality: N/A;   EXTRACORPOREAL SHOCK WAVE LITHOTRIPSY Left 09/29/2019   Procedure: EXTRACORPOREAL SHOCK WAVE LITHOTRIPSY (ESWL);  Surgeon: Hollice Espy, MD;  Location: [facility] ORS;  Service: Urology;  Laterality: Left;   EXTRACORPOREAL SHOCK WAVE LITHOTRIPSY Left 09/20/2020   Procedure: EXTRACORPOREAL SHOCK WAVE LITHOTRIPSY (ESWL);  Surgeon: Hollice Espy, MD;  Location: [facility] ORS;  Service: Urology;  Laterality: Left;   NASAL SEPTOPLASTY W/ TURBINOPLASTY N/A 05/20/2017   Procedure: NASAL SEPTOPLASTY WITH SUBMUCOUS RESECTION;  Surgeon: Clyde Canterbury, MD;  Location: [facility] ORS;  Service: ENT;   Laterality: N/A;   VASECTOMY      Home Medications:  Allergies as of 04/10/2022   No Known Allergies      Medication List        Accurate as of April 10, 2022 10:41 AM. If you have any questions, ask your nurse or doctor.          amLODipine 10 MG tablet Commonly known as: NORVASC Take 1 tablet (10 mg total) by mouth daily.   amLODipine 10 MG tablet Commonly known as: NORVASC Take 1 tablet by mouth once daily   atorvastatin 80 MG tablet Commonly known as: LIPITOR Take 1 tablet (80 mg total) by mouth daily at 6 PM.   empagliflozin 10 MG Tabs tablet Commonly known as: Jardiance Take 1 tablet (10 mg total) by mouth daily before breakfast.   ezetimibe 10 MG tablet Commonly known as: ZETIA Take 1 tablet (10 mg total) by mouth daily.   FLUoxetine 20 MG capsule Commonly known as: PROZAC TAKE 1 CAPSULE BY MOUTH ONCE DAILY IN THE MORNING   hydrochlorothiazide 12.5 MG capsule Commonly known as: MICROZIDE Take 1 capsule (12.5 mg total) by mouth daily.   losartan 25 MG tablet Commonly known as: COZAAR Take one half tablet (12.5 mg) by mouth daily.   metFORMIN 500 MG 24 hr tablet Commonly known as: GLUCOPHAGE-XR TAKE 4 TABLETS EVERY EVENING   omeprazole 20 MG capsule Commonly known as: PRILOSEC Take 1 capsule (20 mg total) by mouth daily.   OneTouch Delica Lancets 99991111 Misc Used to check blood sugars twice a day.   OneTouch Verio test strip Generic drug: glucose blood Used to check blood sugar twice a day.   OneTouch Verio w/Device Kit 1 Device by Does not apply route daily.   Rybelsus 7 MG Tabs Generic drug: Semaglutide Take 7 mg by mouth daily.   tadalafil 5 MG tablet Commonly known as: CIALIS Take 1 tablet (5 mg total) by mouth daily as needed for erectile dysfunction.   tadalafil 20 MG tablet Commonly known as: CIALIS TAKE 1 TABLET BY MOUTH ONCE DAILY AS NEEDED FOR  ERECTILE  DYSFUNCTION  -  EFFECTS  MAY  LAST  UP  TO  36  HRS   tamsulosin 0.4 MG  Caps capsule Commonly known as: FLOMAX Take 1 capsule (0.4 mg total) by mouth daily. Started by: Zara Council, PA-C        Allergies:  No Known Allergies  Family History: Family History  Problem Relation Age of Onset   Hyperlipidemia Mother    Diabetes Mellitus II Mother    Seizures Mother    Diabetes Mother    Mental  illness Mother    Arthritis Mother    Diabetes Father    Cirrhosis Father        non alcoholic   Hyperlipidemia Father    Arthritis Father    Liver cancer Father    Diabetes Maternal Grandmother    Hyperlipidemia Maternal Grandmother    Arthritis Maternal Grandmother    Diabetes Maternal Grandfather    Hyperlipidemia Maternal Grandfather    Arthritis Maternal Grandfather    Diabetes Paternal Grandmother    Hyperlipidemia Paternal Grandmother    Arthritis Paternal Grandmother    Diabetes Paternal Grandfather    Hyperlipidemia Paternal Grandfather    Arthritis Paternal Grandfather    Kidney disease Neg Hx    Prostate cancer Neg Hx    Kidney cancer Neg Hx    Bladder Cancer Neg Hx    Colon cancer Neg Hx    Thyroid cancer Neg Hx     Social History:  reports that he has been smoking cigarettes. He has a 10.00 pack-year smoking history. He has never used smokeless tobacco. He reports that he does not drink alcohol and does not use drugs.   Physical Exam: BP (!) 137/90   Pulse 85   Ht 6' (1.829 m)   Wt 217 lb (98.4 kg)   BMI 29.43 kg/m   Constitutional:  Well nourished. Alert and oriented, No acute distress. HEENT: [place] AT, moist mucus membranes.  Trachea midline Cardiovascular: No clubbing, cyanosis, or edema. Respiratory: Normal respiratory effort, no increased work of breathing. GU: No CVA tenderness.  No bladder fullness or masses.  Patient with circumcised phallus.   Urethral meatus is patent.  No penile discharge. No penile lesions or rashes. Scrotum without lesions, cysts, rashes and/or edema.  Testicles are located scrotally bilaterally. No  masses are appreciated in the testicles. Left and right epididymis are normal.  Left varicocele noted. Rectal: Patient with  normal sphincter tone. Anus and perineum without scarring or rashes. No rectal masses are appreciated. Prostate is approximately 45 grams, no nodules are appreciated. Seminal vesicles could not be palpated Neurologic: Grossly intact, no focal deficits, moving all 4 extremities. Psychiatric: Normal mood and affect.   Laboratory Data: Urinalysis  See EPIC and HPI I have reviewed the labs.     Pertinent Imaging:  04/10/22 10:01  Scan Result 25 ml   Narrative & Impression  CLINICAL DATA:  Left nephrolithiasis, bilateral flank pain   EXAM: ABDOMEN - 1 VIEW   COMPARISON:  07/24/2021   FINDINGS: Stable 12 mm calculus overlies the upper pole of the left kidney. No additional renal or ureteral calculi are identified. Normal abdominal gas pattern. Cholecystectomy clips seen in the right upper quadrant. No organomegaly. No acute bone abnormality.   IMPRESSION: 1. Stable 12 mm left renal calculus.  No ureteral calculi.     Electronically Signed   By: Fidela Salisbury M.D.   On: 04/13/2022 02:51    I have independently reviewed the films.  See HPI.      Assessment & Plan:    Left renal stone  - KUB -left stone stable follow-ups already, so this should not be causing the left-sided flank pain  2. BPH with LUTS -PSA stable  -DRE benign  -UA benign  -PVR < 300 cc  -most bothersome symptoms are spraying of the urinary stream and straining to urinate -continue conservative management, avoiding bladder irritants and timed voiding's -Initiate alpha-blocker (tamsulosin 0.4 mg), discussed side effects  -Continue Cialis 5 mg daily  3. ED  - Continue tadalafil 20 mg on-demand-dosing  4.  Bilateral flank pain -UA benign -KUB with stable left renal stone -Will go ahead and order CT renal stone study for further evaluation although I feel that this bilateral  flank pain may not be urological in nature  Return in about 2 weeks (around 04/24/2022) for CT report, IPSS and PVR.  Keyle Doby, [place] [address], [place], [place] [address] [phone]

## 2022-04-10 ENCOUNTER — Ambulatory Visit: Payer: 59 | Admitting: Urology

## 2022-04-10 ENCOUNTER — Ambulatory Visit
Admission: RE | Admit: 2022-04-10 | Discharge: 2022-04-10 | Disposition: A | Discharge status: Home / Self Care | Payer: 59 | Source: Ambulatory Visit | Attending: Urology | Admitting: Urology

## 2022-04-10 ENCOUNTER — Other Ambulatory Visit: Payer: Self-pay

## 2022-04-10 ENCOUNTER — Ambulatory Visit
Admission: RE | Admit: 2022-04-10 | Discharge: 2022-04-10 | Disposition: A | Discharge status: Home / Self Care | Payer: 59 | Attending: Urology | Admitting: Urology

## 2022-04-10 ENCOUNTER — Encounter: Payer: Self-pay | Admitting: Urology

## 2022-04-10 VITALS — BP 137/90 | HR 85 | Ht 72.0 in | Wt 217.0 lb

## 2022-04-10 DIAGNOSIS — N200 Calculus of kidney: Secondary | ICD-10-CM

## 2022-04-10 DIAGNOSIS — N529 Male erectile dysfunction, unspecified: Secondary | ICD-10-CM

## 2022-04-10 DIAGNOSIS — N401 Enlarged prostate with lower urinary tract symptoms: Secondary | ICD-10-CM | POA: Diagnosis not present

## 2022-04-10 DIAGNOSIS — N138 Other obstructive and reflux uropathy: Secondary | ICD-10-CM

## 2022-04-10 LAB — URINALYSIS, COMPLETE
Bilirubin, UA: NEGATIVE
Ketones, UA: NEGATIVE
Leukocytes,UA: NEGATIVE
Nitrite, UA: NEGATIVE
Protein,UA: NEGATIVE
RBC, UA: NEGATIVE
Specific Gravity, UA: 1.015 (ref 1.005–1.030)
Urobilinogen, Ur: 0.2 mg/dL (ref 0.2–1.0)
pH, UA: 5.5 (ref 5.0–7.5)

## 2022-04-10 LAB — MICROSCOPIC EXAMINATION
Bacteria, UA: NONE SEEN
RBC, Urine: NONE SEEN /hpf (ref 0–2)

## 2022-04-10 LAB — BLADDER SCAN AMB NON-IMAGING: Scan Result: 25

## 2022-04-10 MED ORDER — TAMSULOSIN HCL 0.4 MG PO CAPS: 0.4000 mg | ORAL_CAPSULE | Freq: Every day | ORAL | 3 refills | Status: DC

## 2022-04-10 MED ORDER — TADALAFIL 5 MG PO TABS: 5.0000 mg | ORAL_TABLET | Freq: Every day | ORAL | 3 refills | Status: DC | PRN

## 2022-04-10 MED ORDER — TADALAFIL 20 MG PO TABS: ORAL_TABLET | ORAL | 3 refills | Status: DC

## 2022-04-11 ENCOUNTER — Ambulatory Visit: Payer: 59 | Admitting: Family

## 2022-04-11 ENCOUNTER — Encounter: Payer: Self-pay | Admitting: Family

## 2022-04-11 VITALS — BP 132/70 | HR 96 | Temp 97.5°F | Ht 74.0 in | Wt 209.0 lb

## 2022-04-11 DIAGNOSIS — M25512 Pain in left shoulder: Secondary | ICD-10-CM | POA: Insufficient documentation

## 2022-04-11 DIAGNOSIS — E785 Hyperlipidemia, unspecified: Secondary | ICD-10-CM

## 2022-04-11 DIAGNOSIS — G8929 Other chronic pain: Secondary | ICD-10-CM

## 2022-04-11 DIAGNOSIS — E538 Deficiency of other specified B group vitamins: Secondary | ICD-10-CM | POA: Diagnosis not present

## 2022-04-11 DIAGNOSIS — E1140 Type 2 diabetes mellitus with diabetic neuropathy, unspecified: Secondary | ICD-10-CM | POA: Diagnosis not present

## 2022-04-11 DIAGNOSIS — M545 Low back pain, unspecified: Secondary | ICD-10-CM | POA: Diagnosis not present

## 2022-04-11 DIAGNOSIS — R790 Abnormal level of blood mineral: Secondary | ICD-10-CM

## 2022-04-11 DIAGNOSIS — I10 Essential (primary) hypertension: Secondary | ICD-10-CM | POA: Diagnosis not present

## 2022-04-11 DIAGNOSIS — I700 Atherosclerosis of aorta: Secondary | ICD-10-CM

## 2022-04-11 DIAGNOSIS — Z794 Long term (current) use of insulin: Secondary | ICD-10-CM

## 2022-04-11 LAB — COMPREHENSIVE METABOLIC PANEL
ALT: 17 U/L (ref 0–53)
AST: 17 U/L (ref 0–37)
Albumin: 4.2 g/dL (ref 3.5–5.2)
Alkaline Phosphatase: 109 U/L (ref 39–117)
BUN: 14 mg/dL (ref 6–23)
CO2: 23 mEq/L (ref 19–32)
Calcium: 9.1 mg/dL (ref 8.4–10.5)
Chloride: 106 mEq/L (ref 96–112)
Creatinine, Ser: 0.76 mg/dL (ref 0.40–1.50)
GFR: 102.38 mL/min (ref >60.00)
Glucose, Bld: 124 mg/dL — ABNORMAL HIGH (ref 70–99)
Potassium: 4.3 mEq/L (ref 3.5–5.1)
Sodium: 138 mEq/L (ref 135–145)
Total Bilirubin: 0.4 mg/dL (ref 0.2–1.2)
Total Protein: 7.3 g/dL (ref 6.0–8.3)

## 2022-04-11 LAB — CBC WITH DIFFERENTIAL/PLATELET
Basophils Absolute: 0.1 10*3/uL (ref 0.0–0.1)
Basophils Relative: 0.7 % (ref 0.0–3.0)
Eosinophils Absolute: 0.1 10*3/uL (ref 0.0–0.7)
Eosinophils Relative: 0.8 % (ref 0.0–5.0)
HCT: 44.9 % (ref 39.0–52.0)
Hemoglobin: 14.8 g/dL (ref 13.0–17.0)
Lymphocytes Relative: 12.8 % (ref 12.0–46.0)
Lymphs Abs: 1.1 10*3/uL (ref 0.7–4.0)
MCHC: 33 g/dL (ref 30.0–36.0)
MCV: 82.1 fl (ref 78.0–100.0)
Monocytes Absolute: 0.4 10*3/uL (ref 0.1–1.0)
Monocytes Relative: 4.9 % (ref 3.0–12.0)
Neutro Abs: 7.2 10*3/uL (ref 1.4–7.7)
Neutrophils Relative %: 80.8 % — ABNORMAL HIGH (ref 43.0–77.0)
Platelets: 220 10*3/uL (ref 150.0–400.0)
RBC: 5.47 Mil/uL (ref 4.22–5.81)
RDW: 17.1 % — ABNORMAL HIGH (ref 11.5–15.5)
WBC: 8.9 10*3/uL (ref 4.0–10.5)

## 2022-04-11 LAB — B12 AND FOLATE PANEL
Folate: 8.3 ng/mL (ref >5.9)
Vitamin B-12: >1500 pg/mL — ABNORMAL HIGH (ref 211–911)

## 2022-04-11 LAB — LIPID PANEL
Cholesterol: 192 mg/dL (ref 0–200)
HDL: 32.3 mg/dL — ABNORMAL LOW (ref >39.00)
LDL Cholesterol: 139 mg/dL — ABNORMAL HIGH (ref 0–99)
NonHDL: 160.01
Total CHOL/HDL Ratio: 6
Triglycerides: 104 mg/dL (ref 0.0–149.0)
VLDL: 20.8 mg/dL (ref 0.0–40.0)

## 2022-04-11 LAB — MICROALBUMIN / CREATININE URINE RATIO
Creatinine,U: 70.2 mg/dL
Microalb Creat Ratio: 1 mg/g (ref 0.0–30.0)
Microalb, Ur: <0.7 mg/dL (ref 0.0–1.9)

## 2022-04-11 LAB — TSH: TSH: 1.18 u[IU]/mL (ref 0.35–5.50)

## 2022-04-11 LAB — HEMOGLOBIN A1C: Hgb A1c MFr Bld: 7.8 % — ABNORMAL HIGH (ref 4.6–6.5)

## 2022-04-11 MED ORDER — ATORVASTATIN CALCIUM 80 MG PO TABS: 80.0000 mg | ORAL_TABLET | Freq: Every day | ORAL | 1 refills | Status: DC

## 2022-04-11 MED ORDER — CYCLOBENZAPRINE HCL 5 MG PO TABS: 5.0000 mg | ORAL_TABLET | Freq: Every evening | ORAL | 1 refills | Status: DC | PRN

## 2022-04-11 NOTE — Assessment & Plan Note (Signed)
Pending A1c.   Continue jardiance 10mg , rybelsus 7mg , etformin 1000mg  bid.

## 2022-04-11 NOTE — Progress Notes (Signed)
Assessment & Plan:  Type 2 diabetes mellitus with diabetic neuropathy, without long-term current use of insulin (HCC) -     CBC with Differential/Platelet -     Lipid panel -     Microalbumin / creatinine urine ratio -     Hemoglobin A1c  Hyperlipidemia, unspecified hyperlipidemia type -     Atorvastatin Calcium; Take 1 tablet (80 mg total) by mouth daily at 6 PM.  Dispense: 90 tablet; Refill: 1 -     CBC with Differential/Platelet -     Comprehensive metabolic panel -     Lipid panel  Left-sided low back pain without sciatica, unspecified chronicity Assessment & Plan: 06/2021 Left hip XR with Hip and SI joint spaces preserved, xray lumbar and thoracic  mild DDD seen in both.   Acute on chronic.  Discussed scheduling Tylenol arthritis.  Provided Flexeril to use as needed.  Physical therapy referral.  Close follow-up  Orders: -     VAS [place] LOWER EXTREMITY ARTERIAL DUPLEX; Future -     Ambulatory referral to Physical Therapy -     Cyclobenzaprine HCl; Take 1-2 tablets (5-10 mg total) by mouth at bedtime as needed for muscle spasms.  Dispense: 30 tablet; Refill: 1  B12 deficiency Assessment & Plan: B12 injection given today  Orders: -     B12 and Folate Panel  Low ferritin -     Iron, TIBC and Ferritin Panel  Chronic left shoulder pain Assessment & Plan: Severely limited range of motion and pain,  particularly internal rotation.  Referred to orthopedics as I suspect patient will need cortisone injection prior to starting physical therapy  Orders: -     Ambulatory referral to Orthopedic Surgery  Hypertension, unspecified type -     TSH  Aortic atherosclerosis ([place]) Assessment & Plan: Trial stop atorvastatin 80mg  due to leg cramps. Consider another statin if myalgia resolved.    Type 2 diabetes mellitus with diabetic neuropathy, with long-term current use of insulin (HCC) Assessment & Plan: Pending A1c.   Continue jardiance 10mg , rybelsus 7mg , etformin 1000mg  bid.         Return precautions given.   Risks, benefits, and alternatives of the medications and treatment plan prescribed today were discussed, and patient expressed understanding.   Education regarding symptom management and diagnosis given to patient on AVS either electronically or printed.  Return in about 1 month (around 05/12/2022).  Mable Paris, FNP  Subjective:    Patient ID: Ronald Karvonen., male    DOB: 1970-07-24, 52 y.o.   MRN: [id number]  CC: Ronald Macewan. is a 52 y.o. male who presents today for follow up.   HPI: Complains of lower extremity cramping in both legs at night.  May last hour and then resolves. No leg swelling.  He complains of low back pain for a year, constant. Dull ache today.  After a long day standing, low back and leg pain is worse . Taking ibuprofen 600mg  qd No numbness coming the leg, saddle anesthesia.   He also complains of left shoulder lateral pain for a couple of months.  Painful to sleep on left shoulder.  No numbness.  He is right handed.  No injury.   06/2021 Left hip XR with Hip and SI joint spaces preserved, xray lumbar and thoracic  mild DDD seen in both.  Follow up yesterday with urology for renal stone. Pending XR ab.   He does not drink alcohol  Allergies: Patient has no  known allergies. Current Outpatient Medications on File Prior to Visit  Medication Sig Dispense Refill   amLODipine (NORVASC) 10 MG tablet Take 1 tablet (10 mg total) by mouth daily. 90 tablet 3   Blood Glucose Monitoring Suppl (ONETOUCH VERIO) w/Device KIT 1 Device by Does not apply route daily. 1 kit 0   empagliflozin (JARDIANCE) 10 MG TABS tablet Take 1 tablet (10 mg total) by mouth daily before breakfast. 90 tablet 3   ezetimibe (ZETIA) 10 MG tablet Take 1 tablet (10 mg total) by mouth daily. 90 tablet 3   FLUoxetine (PROZAC) 20 MG capsule TAKE 1 CAPSULE BY MOUTH ONCE DAILY IN THE MORNING 90 capsule 3   glucose blood (ONETOUCH VERIO) test  strip Used to check blood sugar twice a day. 100 each 12   hydrochlorothiazide (MICROZIDE) 12.5 MG capsule Take 1 capsule (12.5 mg total) by mouth daily. 90 capsule 1   losartan (COZAAR) 25 MG tablet Take one half tablet (12.5 mg) by mouth daily. 90 tablet 3   metFORMIN (GLUCOPHAGE-XR) 500 MG 24 hr tablet TAKE 4 TABLETS EVERY EVENING 360 tablet 3   omeprazole (PRILOSEC) 20 MG capsule Take 1 capsule (20 mg total) by mouth daily. 90 capsule 4   OneTouch Delica Lancets 99991111 MISC Used to check blood sugars twice a day. 100 each 3   Semaglutide (RYBELSUS) 7 MG TABS Take 7 mg by mouth daily. 90 tablet 3   tadalafil (CIALIS) 20 MG tablet TAKE 1 TABLET BY MOUTH ONCE DAILY AS NEEDED FOR  ERECTILE  DYSFUNCTION  -  EFFECTS  MAY  LAST  UP  TO  36  HRS 30 tablet 3   tadalafil (CIALIS) 5 MG tablet Take 1 tablet (5 mg total) by mouth daily as needed for erectile dysfunction. 90 tablet 3   tamsulosin (FLOMAX) 0.4 MG CAPS capsule Take 1 capsule (0.4 mg total) by mouth daily. 90 capsule 3   No current facility-administered medications on file prior to visit.    Review of Systems  Constitutional:  Negative for chills and fever.  Respiratory:  Negative for cough.   Cardiovascular:  Negative for chest pain, palpitations and leg swelling.  Gastrointestinal:  Negative for nausea and vomiting.  Musculoskeletal:  Positive for arthralgias.      Objective:    BP 132/70   Pulse 96   Temp (!) 97.5 F (36.4 C) (Oral)   Ht 6\' 2"  (1.88 m)   Wt 209 lb (94.8 kg)   SpO2 98%   BMI 26.83 kg/m  BP Readings from Last 3 Encounters:  04/11/22 132/70  04/10/22 (!) 137/90  12/26/21 (!) 162/94   Wt Readings from Last 3 Encounters:  04/11/22 209 lb (94.8 kg)  04/10/22 217 lb (98.4 kg)  12/26/21 217 lb (98.4 kg)    Physical Exam Vitals reviewed.  Constitutional:      Appearance: He is well-developed.  Cardiovascular:     Rate and Rhythm: Regular rhythm.     Heart sounds: Normal heart sounds.  Pulmonary:      Effort: Pulmonary effort is normal. No respiratory distress.     Breath sounds: Normal breath sounds. No wheezing, rhonchi or rales.  Musculoskeletal:     Right shoulder: No swelling. Normal range of motion.     Left shoulder: Tenderness present. No swelling. Decreased range of motion. Normal strength.       Arms:     Thoracic back: No tenderness.     Lumbar back: Spasms present. No swelling, tenderness or  bony tenderness. Normal range of motion. Negative right straight leg raise test and negative left straight leg raise test.     Right lower leg: No edema.     Left lower leg: No edema.     Comments: Full range of motion with flexion, extension, lateral side bends. No pain, numbness, tingling elicited with single leg raise bilaterally. No rash. Left Shoulder:   No asymmetry of shoulders when comparing right and left.No pain with palpation over glenohumeral joint lines, [place] joint, AC joint, or bicipital groove. Pain with internal and external rotation and with resisted lateral extension .   Negative active painful arc sign.  No pain, swelling, or ecchymosis noted over long head of biceps.   Strength and sensation normal BUE's.   Lymphadenopathy:     Head:     Left side of head: No submandibular or preauricular adenopathy.  Skin:    General: Skin is warm and dry.  Neurological:     Mental Status: He is alert.  Psychiatric:        Speech: Speech normal.        Behavior: Behavior normal.

## 2022-04-11 NOTE — Assessment & Plan Note (Signed)
Severely limited range of motion and pain,  particularly internal rotation.  Referred to orthopedics as I suspect patient will need cortisone injection prior to starting physical therapy

## 2022-04-11 NOTE — Assessment & Plan Note (Addendum)
06/2021 Left hip XR with Hip and SI joint spaces preserved, xray lumbar and thoracic  mild DDD seen in both.   Acute on chronic.  Discussed scheduling Tylenol arthritis.  Provided Flexeril to use as needed.  Physical therapy referral.  Close follow-up

## 2022-04-11 NOTE — Assessment & Plan Note (Signed)
B12 injection given today. 

## 2022-04-11 NOTE — Assessment & Plan Note (Signed)
Trial stop atorvastatin 80mg  due to leg cramps. Consider another statin if myalgia resolved.

## 2022-04-11 NOTE — Patient Instructions (Addendum)
Referral to orthopedics for left shoulder pain.  Please let me know if you are not call the next couple weeks with an appointment .   Trial of Flexeril which is a muscle relaxant  Do not drive or operate heavy machinery while on muscle relaxant. Please do not drink alcohol. Only take this medication as needed for acute muscle spasm at bedtime. This medication make you feel drowsy so be very careful.  Stop taking if become too drowsy or somnolent as this puts you at risk for falls. Please contact our office with any questions.    trial stop atorvastatin for 2 weeks and see if cramping resolves. Let me know so we can decide a path forward.   Prior to bed , gentle stretching and plenty of water throughout the day.   May start b complex vitamin for cramps.   As discussed, let's start by scheduling Tylenol Arthritis which is a 650mg  tablet .   You may take 1-2 tablets every 8 hours ( scheduled) with maximum of 6 tablets per day. Most adults can safely take 4 pills total per day of Tylenol Arthritis 650mg  tablet. Do not exceed 6 tablets a day of Tylenol Arthritis 650mg  tablet   For example , you could take two tablets in the morning ( 8am) and then two tablets again at 4pm.   Maximum daily dose of acetaminophen 4 g per day from all sources.  If you are taking another medication which includes acetaminophen (Tylenol) which may be in cough and cold preparations or pain medication such as Percocet, you will need to factor that into your total daily dose to be safe.  Please let me know if any questions  A great article regarding how to safely take and dose tylenol found below.  Title : 'Acetaminophen safety: Be cautious but not afraid'  https://[email or web address]://www.[facility].org/

## 2022-04-12 LAB — IRON,TIBC AND FERRITIN PANEL
%SAT: 9 % (calc) — ABNORMAL LOW (ref 20–48)
Ferritin: 6 ng/mL — ABNORMAL LOW (ref 38–380)
Iron: 41 ug/dL — ABNORMAL LOW (ref 50–180)
TIBC: 437 mcg/dL (calc) — ABNORMAL HIGH (ref 250–425)

## 2022-04-21 ENCOUNTER — Ambulatory Visit: Payer: 59 | Attending: Family

## 2022-04-24 ENCOUNTER — Ambulatory Visit: Payer: 59

## 2022-04-24 ENCOUNTER — Ambulatory Visit
Admission: RE | Admit: 2022-04-24 | Discharge: 2022-04-24 | Disposition: A | Discharge status: Home / Self Care | Payer: 59 | Source: Ambulatory Visit | Attending: Urology | Admitting: Urology

## 2022-04-24 DIAGNOSIS — N200 Calculus of kidney: Secondary | ICD-10-CM

## 2022-04-29 NOTE — Progress Notes (Signed)
04/30/22 9:31 AM   Ronald Pruitt. Feb 22, 1970 [id number]  Referring provider:  Burnard Hawthorne, FNP [address] [place],  [place] [address]  Urological history  Testosterone deficiency  - contributing factors of age, diabetes and obesity - Testosterone level normalized without treatment   ED  - contributing factors of age, BPH, testosterone deficiency, DM, HTN, HLD, sleep apnea, and stress - tadalafil 20 mg, on-demand dosing  3. BPH with LUTS  - PSA (07/2021) 0.3 - prostate volume ~ 40 cc - tadalafil 5 mg daily  4. Nephrolithiasis  - stone composition 80% calcium oxalate monohydrate and 20% calcium oxalate dihydrate - s/p ESWL left renal stone 2021 - s/p ESWL left renal stone 09/20/2020 - KUB (06/2021) - stable 12 mm LEFT renal calculus.  HPI: Ronald Pruitt. is a 52 y.o.male, who presents today for review of CT scan report.    At his visit on 03/2022, he reported pain in middle of back, not a steady urine stream and doesn't feel like bladder is emptying.  KUB stable 12 mm left renal calculus.  I PSS 14/5.  PVR 25 mL.  UA yellow clear, glucose 3+, specific gravity 1.015, pH 5.5, 0-5 WBCs and 0-10 epithelial cells.  For the last several months, he has been experiencing spraying of his urinary stream and straining to bladder.  He has also been experiencing alternating left-sided flank pain that will radiate to the left groin and right-sided flank pain that does not radiate.  This has been going on for the last several weeks.  Patient denies any modifying or aggravating factors.  Patient denies any gross hematuria, dysuria or suprapubic/flank pain.  Patient denies any fevers, chills, nausea or vomiting.   We initiated tamsulosin 0.4 mg at that visit in addition to the tadalafil 5 mg daily.  He was to continue the tadalafil 20 mg on-demand dosing for his erectile dysfunction.  We ordered a CT renal stone study for further evaluation of his bilateral flank  pain.  CT renal stone study (03/2022) 12 mm left renal calculus.    I PSS 10/4  He continues to experience a weak urinary stream, feeling of incomplete bladder emptying, spraying of urinary stream, straining to urinate and intermittency despite being on tamsulosin 0.4 mg daily and tadalafil 5 mg daily.  Patient denies any modifying or aggravating factors.  Patient denies any gross hematuria, dysuria or suprapubic/flank pain.  Patient denies any fevers, chills, nausea or vomiting.     IPSS     Row Name 04/30/22 0900         International Prostate Symptom Score   How often have you had the sensation of not emptying your bladder? Less than 1 in 5     How often have you had to urinate less than every two hours? Less than half the time     How often have you found you stopped and started again several times when you urinated? Not at All     How often have you found it difficult to postpone urination? Less than 1 in 5 times     How often have you had a weak urinary stream? Less than half the time     How often have you had to strain to start urination? About half the time     How many times did you typically get up at night to urinate? 1 Time     Total IPSS Score 10  Quality of Life due to urinary symptoms   If you were to spend the rest of your life with your urinary condition just the way it is now how would you feel about that? Mostly Disatisfied              Score:  1-7 Mild 8-19 Moderate 20-35 Severe      PMH: Past Medical History:  Diagnosis Date   Arthritis    BPH (benign prostatic hyperplasia)    Diabetes    Dysuria    GERD (gastroesophageal reflux disease)    History of kidney stones    HTN (hypertension)    Hyperlipidemia    Impotence    Nocturia    Obesity    Pneumonia age 52's   Sleep apnea    CPAP - severe (sleep study 01/11/16)   Testicular hypofunction    Wears dentures    full upper and lower    Surgical History: Past Surgical History:   Procedure Laterality Date   CHOLECYSTECTOMY     COLONOSCOPY WITH PROPOFOL N/A 10/12/2018   Procedure: COLONOSCOPY WITH PROPOFOL;  Surgeon: Lucilla Lame, MD;  Location: [facility];  Service: Endoscopy;  Laterality: N/A;   EXTRACORPOREAL SHOCK WAVE LITHOTRIPSY Left 09/29/2019   Procedure: EXTRACORPOREAL SHOCK WAVE LITHOTRIPSY (ESWL);  Surgeon: Hollice Espy, MD;  Location: [facility];  Service: Urology;  Laterality: Left;   EXTRACORPOREAL SHOCK WAVE LITHOTRIPSY Left 09/20/2020   Procedure: EXTRACORPOREAL SHOCK WAVE LITHOTRIPSY (ESWL);  Surgeon: Hollice Espy, MD;  Location: [facility];  Service: Urology;  Laterality: Left;   NASAL SEPTOPLASTY W/ TURBINOPLASTY N/A 05/20/2017   Procedure: NASAL SEPTOPLASTY WITH SUBMUCOUS RESECTION;  Surgeon: Clyde Canterbury, MD;  Location: [facility];  Service: ENT;  Laterality: N/A;   VASECTOMY      Home Medications:  Allergies as of 04/30/2022   No Known Allergies      Medication List        Accurate as of April 30, 2022  9:31 AM. If you have any questions, ask your nurse or doctor.          amLODipine 10 MG tablet Commonly known as: NORVASC Take 1 tablet (10 mg total) by mouth daily.   atorvastatin 80 MG tablet Commonly known as: LIPITOR Take 1 tablet (80 mg total) by mouth daily at 6 PM.   cyclobenzaprine 5 MG tablet Commonly known as: FLEXERIL Take 1-2 tablets (5-10 mg total) by mouth at bedtime as needed for muscle spasms.   empagliflozin 10 MG Tabs tablet Commonly known as: Jardiance Take 1 tablet (10 mg total) by mouth daily before breakfast.   ezetimibe 10 MG tablet Commonly known as: ZETIA Take 1 tablet (10 mg total) by mouth daily.   FLUoxetine 20 MG capsule Commonly known as: PROZAC TAKE 1 CAPSULE BY MOUTH ONCE DAILY IN THE MORNING   hydrochlorothiazide 12.5 MG capsule Commonly known as: MICROZIDE Take 1 capsule (12.5 mg total) by mouth daily.   losartan 25 MG tablet Commonly known as: COZAAR Take one half tablet (12.5 mg)  by mouth daily.   metFORMIN 500 MG 24 hr tablet Commonly known as: GLUCOPHAGE-XR TAKE 4 TABLETS EVERY EVENING   omeprazole 20 MG capsule Commonly known as: PRILOSEC Take 1 capsule (20 mg total) by mouth daily.   OneTouch Delica Lancets 99991111 Misc Used to check blood sugars twice a day.   OneTouch Verio test strip Generic drug: glucose blood Used to check blood sugar twice a day.   OneTouch Verio w/Device Kit 1 Device  by Does not apply route daily.   Rybelsus 7 MG Tabs Generic drug: Semaglutide Take 7 mg by mouth daily.   tadalafil 5 MG tablet Commonly known as: CIALIS Take 1 tablet (5 mg total) by mouth daily as needed for erectile dysfunction.   tadalafil 20 MG tablet Commonly known as: CIALIS TAKE 1 TABLET BY MOUTH ONCE DAILY AS NEEDED FOR  ERECTILE  DYSFUNCTION  -  EFFECTS  MAY  LAST  UP  TO  36  HRS   tamsulosin 0.4 MG Caps capsule Commonly known as: FLOMAX Take 1 capsule (0.4 mg total) by mouth daily.        Allergies:  No Known Allergies  Family History: Family History  Problem Relation Age of Onset   Hyperlipidemia Mother    Diabetes Mellitus II Mother    Seizures Mother    Diabetes Mother    Mental illness Mother    Arthritis Mother    Diabetes Father    Cirrhosis Father        non alcoholic   Hyperlipidemia Father    Arthritis Father    Liver cancer Father    Diabetes Maternal Grandmother    Hyperlipidemia Maternal Grandmother    Arthritis Maternal Grandmother    Diabetes Maternal Grandfather    Hyperlipidemia Maternal Grandfather    Arthritis Maternal Grandfather    Diabetes Paternal Grandmother    Hyperlipidemia Paternal Grandmother    Arthritis Paternal Grandmother    Diabetes Paternal Grandfather    Hyperlipidemia Paternal Grandfather    Arthritis Paternal Grandfather    Kidney disease Neg Hx    Prostate cancer Neg Hx    Kidney cancer Neg Hx    Bladder Cancer Neg Hx    Colon cancer Neg Hx    Thyroid cancer Neg Hx     Social  History:  reports that he has been smoking cigarettes. He has a 10.00 pack-year smoking history. He has never used smokeless tobacco. He reports that he does not drink alcohol and does not use drugs.   Physical Exam: BP 132/88   Pulse 87   Ht 6\' 5"  (1.956 m)   Wt 213 lb 8 oz (96.8 kg)   BMI 25.32 kg/m   Constitutional:  Well nourished. Alert and oriented, No acute distress. HEENT: [place] AT, moist mucus membranes.  Trachea midline Cardiovascular: No clubbing, cyanosis, or edema. Respiratory: Normal respiratory effort, no increased work of breathing. Neurologic: Grossly intact, no focal deficits, moving all 4 extremities. Psychiatric: Normal mood and affect.   Laboratory Data: N/A   Pertinent Imaging: CLINICAL DATA:  Flank and back pain for several months. Dysuria. Nephrolithiasis.   EXAM: CT ABDOMEN AND PELVIS WITHOUT CONTRAST   TECHNIQUE: Multidetector CT imaging of the abdomen and pelvis was performed following the standard protocol without IV contrast.   RADIATION DOSE REDUCTION: This exam was performed according to the departmental dose-optimization program which includes automated exposure control, adjustment of the mA and/or kV according to patient size and/or use of iterative reconstruction technique.   COMPARISON:  09/12/2019   FINDINGS: Lower chest: Stable tiny less than 5 mm pulmonary nodules in the anterior left lower lobe and anterior right middle lobe.   Hepatobiliary: No mass visualized on this unenhanced exam. Prior cholecystectomy. No evidence of biliary obstruction.   Pancreas: No mass or inflammatory process visualized on this unenhanced exam.   Spleen:  Within normal limits in size.   Adrenals/Urinary tract: 12 mm calculus seen in upper pole of left kidney. No  evidence of ureteral calculi or hydronephrosis. Unremarkable unopacified urinary bladder.   Stomach/Bowel: No evidence of obstruction, inflammatory process, or abnormal fluid collections.  Normal appendix visualized.   Vascular/Lymphatic: No pathologically enlarged lymph nodes identified. No evidence of abdominal aortic aneurysm. Aortic atherosclerotic calcification incidentally noted.   Reproductive:  No mass or other significant abnormality.   Other:  None.   Musculoskeletal:  No suspicious bone lesions identified.   IMPRESSION: 12 mm left renal calculus. No evidence of ureteral calculi, hydronephrosis, or other acute findings.     Electronically Signed   By: Marlaine Hind M.D.   On: 04/24/2022 21:24 I have independently reviewed the films.  See HPI.      Assessment & Plan:    Left renal stone  - CT renal stone study reveal stable 12 mm left renal stone  2. BPH with LUTS -most bothersome symptoms are spraying of the urinary stream and straining to urinate -continue conservative management, avoiding bladder irritants and timed voiding's -He saw no change in his urinary symptoms despite taking tadalafil 5 mg daily and tamsulosin 0.4 mg daily -I explained that we need to pursue to further evaluation to rule out urethral stricture versus B00 -I have explained to the patient that they will  be scheduled for a cystoscopy in our office to evaluate their bladder.  The cystoscopy consists of passing a tube with a lens up through their urethra and into their urinary bladder.   We will inject the urethra with a lidocaine gel prior to introducing the cystoscope to help with any discomfort during the procedure.   After the procedure, they might experience blood in the urine and discomfort with urination.  This will abate after the first few voids.  I have  encouraged the patient to increase water intake  during this time.  Patient denies any allergies to lidocaine.   -return for cysto w/ Dr. Erlene Quan for evaluation of BOO  3. ED  - tadalafil 20 mg on-demand-dosing  4.  Bilateral flank pain - CT did not reveal an urological etiology for his flank pain - advised him to seek  further evaluation with his PCP  Return for cysto w/ Dr. Erlene Quan for BOO.  Nelsy Madonna, [place] [address], [place], [place] [address] [phone]

## 2022-04-30 ENCOUNTER — Encounter: Payer: Self-pay | Admitting: Urology

## 2022-04-30 ENCOUNTER — Ambulatory Visit: Payer: 59 | Admitting: Urology

## 2022-04-30 VITALS — BP 132/88 | HR 87 | Ht 77.0 in | Wt 213.5 lb

## 2022-04-30 DIAGNOSIS — N200 Calculus of kidney: Secondary | ICD-10-CM

## 2022-04-30 DIAGNOSIS — N401 Enlarged prostate with lower urinary tract symptoms: Secondary | ICD-10-CM | POA: Diagnosis not present

## 2022-04-30 DIAGNOSIS — N529 Male erectile dysfunction, unspecified: Secondary | ICD-10-CM

## 2022-04-30 DIAGNOSIS — G8929 Other chronic pain: Secondary | ICD-10-CM

## 2022-04-30 DIAGNOSIS — M545 Low back pain, unspecified: Secondary | ICD-10-CM

## 2022-04-30 DIAGNOSIS — N138 Other obstructive and reflux uropathy: Secondary | ICD-10-CM

## 2022-04-30 DIAGNOSIS — R109 Unspecified abdominal pain: Secondary | ICD-10-CM

## 2022-04-30 NOTE — Patient Instructions (Signed)

## 2022-05-05 ENCOUNTER — Encounter: Payer: Self-pay | Admitting: Family

## 2022-05-06 ENCOUNTER — Other Ambulatory Visit: Payer: Self-pay | Admitting: Family

## 2022-05-06 MED ORDER — EMPAGLIFLOZIN 25 MG PO TABS: 25.0000 mg | ORAL_TABLET | Freq: Every day | ORAL | 3 refills | Status: DC

## 2022-05-13 ENCOUNTER — Encounter: Payer: Self-pay | Admitting: Family

## 2022-05-13 ENCOUNTER — Ambulatory Visit: Payer: 59 | Admitting: Family

## 2022-05-13 VITALS — BP 138/86 | HR 80 | Temp 97.6°F | Ht 72.0 in | Wt 206.6 lb

## 2022-05-13 DIAGNOSIS — M25512 Pain in left shoulder: Secondary | ICD-10-CM | POA: Diagnosis not present

## 2022-05-13 DIAGNOSIS — Z794 Long term (current) use of insulin: Secondary | ICD-10-CM

## 2022-05-13 DIAGNOSIS — E785 Hyperlipidemia, unspecified: Secondary | ICD-10-CM | POA: Diagnosis not present

## 2022-05-13 DIAGNOSIS — G8929 Other chronic pain: Secondary | ICD-10-CM

## 2022-05-13 DIAGNOSIS — E1140 Type 2 diabetes mellitus with diabetic neuropathy, unspecified: Secondary | ICD-10-CM

## 2022-05-13 MED ORDER — RYBELSUS 14 MG PO TABS
14.0000 mg | ORAL_TABLET | Freq: Every day | ORAL | 3 refills | Status: DC
Start: 2022-05-13 — End: 2022-12-24

## 2022-05-13 NOTE — Assessment & Plan Note (Addendum)
Based on presentation of arthralgias, I do not think Lipitor is the culprit.   Advised to resume Lipitor .

## 2022-05-13 NOTE — Assessment & Plan Note (Signed)
Suboptimal control.  Advised to increase Rybelsus to 14 mg.  Continue Jardiance 25 mg daily, metformin 2000 mg QD.

## 2022-05-13 NOTE — Progress Notes (Signed)
Assessment & Plan:  Type 2 diabetes mellitus with diabetic neuropathy, with long-term current use of insulin Assessment & Plan: Suboptimal control.  Advised to increase Rybelsus to 14 mg.  Continue Jardiance 25 mg daily, metformin 2000 mg QD.   Orders: -     Rybelsus; Take 1 tablet (14 mg total) by mouth daily.  Dispense: 90 tablet; Refill: 3  Hyperlipidemia, unspecified hyperlipidemia type Assessment & Plan: Based on presentation of arthralgias, I do not think Lipitor is the culprit.   Advised to resume Lipitor .    Chronic left shoulder pain Assessment & Plan: He has been seen by orthopedics, Dr. Hyacinth Meeker and has follow-up scheduled with him.  Will follow.      Return precautions given.   Risks, benefits, and alternatives of the medications and treatment plan prescribed today were discussed, and patient expressed understanding.   Education regarding symptom management and diagnosis given to patient on AVS either electronically or printed.  No follow-ups on file.  Rennie Plowman, FNP  Subjective:    Patient ID: Ronald Pruitt., male    DOB: [birth year], 52 y.o.   MRN: [id number]  CC: Ronald Pruitt. is a 52 y.o. male who presents today for follow up.   HPI: Feels well today.  No new complaints    DM- FBG usually in the 180's, random afternoon 190-230.  He is compliant with Rybelsus 7 mg daily, Jardiance 25 mg daily, metformin 2000 mg.  He feels Ozempic was overall more effective for him   Neck pain and left shoulder pain which has improved. He is taking robaxin, mobic        Seen by Dr. Hyacinth Meeker 05/01/2018 for left shoulder impingement.  Given injection.  He states neck and shoulder x-ray were obtained  Leg cramps in legs improved with use of Flexeril.  He is not using Flexeril at the same time as Robaxin.  Couple nights ago he did have muscle cramping.  Trial stop atorvastatin  He is not longer getting b12 IM; he is taking b12 500 mcg daily.    Allergies: Patient has no known allergies. Current Outpatient Medications on File Prior to Visit  Medication Sig Dispense Refill   amLODipine (NORVASC) 10 MG tablet Take 1 tablet (10 mg total) by mouth daily. 90 tablet 3   atorvastatin (LIPITOR) 80 MG tablet Take 1 tablet (80 mg total) by mouth daily at 6 PM. 90 tablet 1   Blood Glucose Monitoring Suppl (ONETOUCH VERIO) w/Device KIT 1 Device by Does not apply route daily. 1 kit 0   cyclobenzaprine (FLEXERIL) 5 MG tablet Take 1-2 tablets (5-10 mg total) by mouth at bedtime as needed for muscle spasms. 30 tablet 1   empagliflozin (JARDIANCE) 25 MG TABS tablet Take 1 tablet (25 mg total) by mouth daily before breakfast. 90 tablet 3   ezetimibe (ZETIA) 10 MG tablet Take 1 tablet (10 mg total) by mouth daily. 90 tablet 3   glucose blood (ONETOUCH VERIO) test strip Used to check blood sugar twice a day. 100 each 12   hydrochlorothiazide (MICROZIDE) 12.5 MG capsule Take 1 capsule (12.5 mg total) by mouth daily. 90 capsule 1   losartan (COZAAR) 25 MG tablet Take one half tablet (12.5 mg) by mouth daily. 90 tablet 3   meloxicam (MOBIC) 15 MG tablet Take 15 mg by mouth daily.     metFORMIN (GLUCOPHAGE-XR) 500 MG 24 hr tablet TAKE 4 TABLETS EVERY EVENING 360 tablet 3   methocarbamol (ROBAXIN) 500  MG tablet Take 1 tablet by mouth 2 (two) times daily.     omeprazole (PRILOSEC) 20 MG capsule Take 1 capsule (20 mg total) by mouth daily. 90 capsule 4   OneTouch Delica Lancets 33G MISC Used to check blood sugars twice a day. 100 each 3   tadalafil (CIALIS) 20 MG tablet TAKE 1 TABLET BY MOUTH ONCE DAILY AS NEEDED FOR  ERECTILE  DYSFUNCTION  -  EFFECTS  MAY  LAST  UP  TO  36  HRS 30 tablet 3   tadalafil (CIALIS) 5 MG tablet Take 1 tablet (5 mg total) by mouth daily as needed for erectile dysfunction. 90 tablet 3   No current facility-administered medications on file prior to visit.    Review of Systems  Constitutional:  Negative for chills and fever.   Respiratory:  Negative for cough.   Cardiovascular:  Negative for chest pain and palpitations.  Gastrointestinal:  Negative for nausea and vomiting.  Musculoskeletal:  Positive for arthralgias.      Objective:    BP 138/86   Pulse 80   Temp 97.6 F (36.4 C) (Oral)   Ht 6' (1.829 m)   Wt 206 lb 9.6 oz (93.7 kg)   SpO2 98%   BMI 28.02 kg/m  BP Readings from Last 3 Encounters:  05/13/22 138/86  04/30/22 132/88  04/11/22 132/70   Wt Readings from Last 3 Encounters:  05/13/22 206 lb 9.6 oz (93.7 kg)  04/30/22 213 lb 8 oz (96.8 kg)  04/11/22 209 lb (94.8 kg)    Physical Exam Vitals reviewed.  Constitutional:      Appearance: He is well-developed.  Cardiovascular:     Rate and Rhythm: Regular rhythm.     Heart sounds: Normal heart sounds.  Pulmonary:     Effort: Pulmonary effort is normal. No respiratory distress.     Breath sounds: Normal breath sounds. No wheezing, rhonchi or rales.  Skin:    General: Skin is warm and dry.  Neurological:     Mental Status: He is alert.  Psychiatric:        Speech: Speech normal.        Behavior: Behavior normal.

## 2022-05-13 NOTE — Patient Instructions (Addendum)
Please resume atorvastatin   Cyclobenzaprine AND methocarbamol are BOTH muscle relaxants. Only take one or the other   Call to make an appointment for Annual Lung cancer screen  With CT Chest:  [phone]. Let me know if any issues in doing so.

## 2022-05-13 NOTE — Assessment & Plan Note (Signed)
He has been seen by orthopedics, Dr. Hyacinth Meeker and has follow-up scheduled with him.  Will follow.

## 2022-05-21 ENCOUNTER — Other Ambulatory Visit: Payer: 59 | Admitting: Urology

## 2022-06-24 LAB — HM DIABETES EYE EXAM

## 2022-07-22 ENCOUNTER — Encounter: Payer: Self-pay | Admitting: Family

## 2022-07-22 ENCOUNTER — Ambulatory Visit: Payer: 59 | Admitting: Family

## 2022-07-22 VITALS — BP 138/76 | HR 85 | Temp 97.7°F | Ht 73.0 in | Wt 208.6 lb

## 2022-07-22 DIAGNOSIS — E1140 Type 2 diabetes mellitus with diabetic neuropathy, unspecified: Secondary | ICD-10-CM

## 2022-07-22 DIAGNOSIS — I10 Essential (primary) hypertension: Secondary | ICD-10-CM

## 2022-07-22 DIAGNOSIS — K130 Diseases of lips: Secondary | ICD-10-CM | POA: Insufficient documentation

## 2022-07-22 DIAGNOSIS — M545 Low back pain, unspecified: Secondary | ICD-10-CM

## 2022-07-22 DIAGNOSIS — Z794 Long term (current) use of insulin: Secondary | ICD-10-CM

## 2022-07-22 DIAGNOSIS — R7309 Other abnormal glucose: Secondary | ICD-10-CM

## 2022-07-22 LAB — POCT GLYCOSYLATED HEMOGLOBIN (HGB A1C): Hemoglobin A1C: 7.4 % — AB (ref 4.0–5.6)

## 2022-07-22 MED ORDER — MUPIROCIN 2 % EX OINT
1.0000 | TOPICAL_OINTMENT | Freq: Two times a day (BID) | CUTANEOUS | 2 refills | Status: DC
Start: 2022-07-22 — End: 2023-09-16

## 2022-07-22 MED ORDER — LOSARTAN POTASSIUM 25 MG PO TABS
25.0000 mg | ORAL_TABLET | Freq: Every day | ORAL | 3 refills | Status: DC
Start: 2022-07-22 — End: 2023-05-26

## 2022-07-22 NOTE — Assessment & Plan Note (Signed)
Suboptimal control, however improved from prior.  Long discussion regards to dietary discretion particularly as it relates to sodas.  Continue Rybelsus 14 mg ,Jardiance 25 mg daily, metformin 2000 mg QD.  Patient and I discussed adding long acting insulin however he prefers to work on lifestyle changes first.  Follow-up in 3 months time.

## 2022-07-22 NOTE — Assessment & Plan Note (Signed)
Elevated today however patient has not been taking hydrochlorothiazide for unknown reason.  In the setting of nocturnal leg cramps we opted to increase losartan 25 mg daily, discontinue hydrochlorothiazide 12.5 mg daily and continue amlodipine 10 mg daily.

## 2022-07-22 NOTE — Patient Instructions (Addendum)
For muscle cramping, may trial    vitamin B complex (three times daily, containing 30 mg of vitamin B6) or vitamin E (800 international units before bed).  May consider gabapentin if cramping persists.   Ice low back 20 minutes twice per day  As discussed, let's start by scheduling Tylenol Arthritis which is a 650mg  tablet .   You may take 1-2 tablets every 8 hours ( scheduled) with maximum of 6 tablets per day. Most adults can safely take 4 pills total per day of Tylenol Arthritis 650mg  tablet. Do not exceed 6 tablets a day of Tylenol Arthritis 650mg  tablet   For example , you could take two tablets in the morning ( 8am) and then two tablets again at 4pm.   Maximum daily dose of acetaminophen 4 g per day from all sources.  If you are taking another medication which includes acetaminophen (Tylenol) which may be in cough and cold preparations or pain medication such as Percocet, you will need to factor that into your total daily dose to be safe.  Please let me know if any questions  A great article regarding how to safely take and dose tylenol found below.  Title : 'Acetaminophen safety: Be cautious but not afraid'  https://www.health.https://gentry.org/  Carbohydrate Counting for Diabetes Mellitus, Adult Carbohydrate counting is a method of keeping track of how many carbohydrates you eat. Eating carbohydrates increases the amount of sugar (glucose) in the blood. Counting how many carbohydrates you eat improves how well you manage your blood glucose. This, in turn, helps you manage your diabetes. Carbohydrates are measured in grams (g) per serving. It is important to know how many carbohydrates (in grams or by serving size) you can have in each meal. This is different for every person. A dietitian can help you make a meal plan and calculate how many carbohydrates you should have at each meal and snack. What foods contain  carbohydrates? Carbohydrates are found in the following foods: Grains, such as breads and cereals. Dried beans and soy products. Starchy vegetables, such as potatoes, peas, and corn. Fruit and fruit juices. Milk and yogurt. Sweets and snack foods, such as cake, cookies, candy, chips, and soft drinks. How do I count carbohydrates in foods? There are two ways to count carbohydrates in food. You can read food labels or learn standard serving sizes of foods. You can use either of these methods or a combination of both. Using the Nutrition Facts label The Nutrition Facts list is included on the labels of almost all packaged foods and beverages in the [place]. It includes: The serving size. Information about nutrients in each serving, including the grams of carbohydrate per serving. To use the Nutrition Facts, decide how many servings you will have. Then, multiply the number of servings by the number of carbohydrates per serving. The resulting number is the total grams of carbohydrates that you will be having. Learning the standard serving sizes of foods When you eat carbohydrate foods that are not packaged or do not include Nutrition Facts on the label, you need to measure the servings in order to count the grams of carbohydrates. Measure the foods that you will eat with a food scale or measuring cup, if needed. Decide how many standard-size servings you will eat. Multiply the number of servings by 15. For foods that contain carbohydrates, one serving equals 15 g of carbohydrates. For example, if you eat 2 cups or 10 oz (300 g) of strawberries, you will have eaten 2 servings  and 30 g of carbohydrates (2 servings x 15 g = 30 g). For foods that have more than one food mixed, such as soups and casseroles, you must count the carbohydrates in each food that is included. The following list contains standard serving sizes of common carbohydrate-rich foods. Each of these servings has about 15 g of  carbohydrates: 1 slice of bread. 1 six-inch (15 cm) tortilla. ? cup or 2 oz (53 g) cooked rice or pasta.  cup or 3 oz (85 g) cooked or canned, drained and rinsed beans or lentils.  cup or 3 oz (85 g) starchy vegetable, such as peas, corn, or squash.  cup or 4 oz (120 g) hot cereal.  cup or 3 oz (85 g) boiled or mashed potatoes, or  or 3 oz (85 g) of a large baked potato.  cup or 4 fl oz (118 mL) fruit juice. 1 cup or 8 fl oz (237 mL) milk. 1 small or 4 oz (106 g) apple.  or 2 oz (63 g) of a medium banana. 1 cup or 5 oz (150 g) strawberries. 3 cups or 1 oz (28.3 g) popped popcorn. What is an example of carbohydrate counting? To calculate the grams of carbohydrates in this sample meal, follow the steps shown below. Sample meal 3 oz (85 g) chicken breast. ? cup or 4 oz (106 g) brown rice.  cup or 3 oz (85 g) corn. 1 cup or 8 fl oz (237 mL) milk. 1 cup or 5 oz (150 g) strawberries with sugar-free whipped topping. Carbohydrate calculation Identify the foods that contain carbohydrates: Rice. Corn. Milk. Strawberries. Calculate how many servings you have of each food: 2 servings rice. 1 serving corn. 1 serving milk. 1 serving strawberries. Multiply each number of servings by 15 g: 2 servings rice x 15 g = 30 g. 1 serving corn x 15 g = 15 g. 1 serving milk x 15 g = 15 g. 1 serving strawberries x 15 g = 15 g. Add together all of the amounts to find the total grams of carbohydrates eaten: 30 g + 15 g + 15 g + 15 g = 75 g of carbohydrates total. What are tips for following this plan? Shopping Develop a meal plan and then make a shopping list. Buy fresh and frozen vegetables, fresh and frozen fruit, dairy, eggs, beans, lentils, and whole grains. Look at food labels. Choose foods that have more fiber and less sugar. Avoid processed foods and foods with added sugars. Meal planning Aim to have the same number of grams of carbohydrates at each meal and for each snack  time. Plan to have regular, balanced meals and snacks. Where to find more information American Diabetes Association: diabetes.org Centers for Disease Control and Prevention: TonerPromos.no Academy of Nutrition and Dietetics: eatright.org Association of Diabetes Care & Education Specialists: diabeteseducator.org Summary Carbohydrate counting is a method of keeping track of how many carbohydrates you eat. Eating carbohydrates increases the amount of sugar (glucose) in your blood. Counting how many carbohydrates you eat improves how well you manage your blood glucose. This helps you manage your diabetes. A dietitian can help you make a meal plan and calculate how many carbohydrates you should have at each meal and snack. This information is not intended to replace advice given to you by your health care provider. Make sure you discuss any questions you have with your health care provider. Document Revised: 08/17/2019 Document Reviewed: 08/17/2019 Elsevier Patient Education  2024 [employer].

## 2022-07-22 NOTE — Assessment & Plan Note (Signed)
Aggravated after fall.  Improving on its own.  Patient politely declines x-ray at this time.  Discussed known degenerative changes seen on previous lumbar x-ray 2023.  Patient will let me know how he is doing if symptoms do not continue to improve.  Encouraged scheduling Tylenol arthritis

## 2022-07-22 NOTE — Assessment & Plan Note (Signed)
Trial of Bactroban.  Consider corticosteroid.

## 2022-07-22 NOTE — Progress Notes (Signed)
Assessment & Plan:  Hypertension, unspecified type Assessment & Plan: Elevated today however patient has not been taking hydrochlorothiazide for unknown reason.  In the setting of nocturnal leg cramps we opted to increase losartan 25 mg daily, discontinue hydrochlorothiazide 12.5 mg daily and continue amlodipine 10 mg daily.  Orders: -     Losartan Potassium; Take 1 tablet (25 mg total) by mouth daily.  Dispense: 90 tablet; Refill: 3  Elevated glucose -     POCT glycosylated hemoglobin (Hb A1C)  Angular cheilitis Assessment & Plan: Trial of Bactroban.  Consider corticosteroid.   Orders: -     Mupirocin; Apply 1 Application topically 2 (two) times daily.  Dispense: 22 g; Refill: 2  Type 2 diabetes mellitus with diabetic neuropathy, with long-term current use of insulin (HCC) Assessment & Plan: Suboptimal control, however improved from prior.  Long discussion regards to dietary discretion particularly as it relates to sodas.  Continue Rybelsus 14 mg ,Jardiance 25 mg daily, metformin 2000 mg QD.  Patient and I discussed adding long acting insulin however he prefers to work on lifestyle changes first.  Follow-up in 3 months time.    Left-sided low back pain without sciatica, unspecified chronicity Assessment & Plan: Aggravated after fall.  Improving on its own.  Patient politely declines x-ray at this time.  Discussed known degenerative changes seen on previous lumbar x-ray 2023.  Patient will let me know how he is doing if symptoms do not continue to improve.  Encouraged scheduling Tylenol arthritis      Return precautions given.   Risks, benefits, and alternatives of the medications and treatment plan prescribed today were discussed, and patient expressed understanding.   Education regarding symptom management and diagnosis given to patient on AVS either electronically or printed.  Return in about 3 months (around 10/22/2022).  Rennie Plowman, FNP  Subjective:    Patient  ID: Ronald Loth., male    DOB: 06-16-1970, 52 y.o.   MRN: [id number]  CC: Ronald Lange. is a 52 y.o. male who presents today for follow up.   HPI: He is not taking hydrochlorothiazide.   He is doing well on increased dose Rybelsus 14mg . No constipation.  Compliant with metformin 2000mg , jardiance 25mg .   Breakfast is a biscuit and dinner is burger of fast food. He doesn't eat lunch. He drinks water, pepsi , coke .   Bilateral cracking lips on the sides.  He sleeps with mouth open.  He is compliant with CPAP machine with dehumidifier.  wears vaseline with some relief. No inhalers.       He had episode 3 weeks ago at night in which both legs were cramping causing him to fall and his 'tail bone' hit the floor. Pain has improved.  No numbness. He is taking ibuprofen 400mg  every morning. Sitting is uncomfortable. No pain with walking.    Allergies: Patient has no known allergies. Current Outpatient Medications on File Prior to Visit  Medication Sig Dispense Refill  . amLODipine (NORVASC) 10 MG tablet Take 1 tablet (10 mg total) by mouth daily. 90 tablet 3  . atorvastatin (LIPITOR) 80 MG tablet Take 1 tablet (80 mg total) by mouth daily at 6 PM. 90 tablet 1  . Blood Glucose Monitoring Suppl (ONETOUCH VERIO) w/Device KIT 1 Device by Does not apply route daily. 1 kit 0  . empagliflozin (JARDIANCE) 25 MG TABS tablet Take 1 tablet (25 mg total) by mouth daily before breakfast. 90 tablet 3  . ezetimibe (  ZETIA) 10 MG tablet Take 1 tablet (10 mg total) by mouth daily. 90 tablet 3  . glucose blood (ONETOUCH VERIO) test strip Used to check blood sugar twice a day. 100 each 12  . meloxicam (MOBIC) 15 MG tablet Take 15 mg by mouth daily.    . metFORMIN (GLUCOPHAGE-XR) 500 MG 24 hr tablet TAKE 4 TABLETS EVERY EVENING 360 tablet 3  . omeprazole (PRILOSEC) 20 MG capsule Take 1 capsule (20 mg total) by mouth daily. 90 capsule 4  . OneTouch Delica Lancets 33G MISC Used to check blood sugars  twice a day. 100 each 3  . Semaglutide (RYBELSUS) 14 MG TABS Take 1 tablet (14 mg total) by mouth daily. 90 tablet 3  . tadalafil (CIALIS) 20 MG tablet TAKE 1 TABLET BY MOUTH ONCE DAILY AS NEEDED FOR  ERECTILE  DYSFUNCTION  -  EFFECTS  MAY  LAST  UP  TO  36  HRS 30 tablet 3  . tadalafil (CIALIS) 5 MG tablet Take 1 tablet (5 mg total) by mouth daily as needed for erectile dysfunction. 90 tablet 3   No current facility-administered medications on file prior to visit.    Review of Systems  Constitutional:  Negative for chills and fever.  Respiratory:  Negative for cough.   Cardiovascular:  Negative for chest pain, palpitations and leg swelling.  Gastrointestinal:  Negative for nausea and vomiting.  Musculoskeletal:  Positive for back pain.  Neurological:  Negative for numbness.      Objective:    BP 138/76   Pulse 85   Temp 97.7 F (36.5 C) (Oral)   Ht 6\' 1"  (1.854 m)   Wt 208 lb 9.6 oz (94.6 kg)   SpO2 99%   BMI 27.52 kg/m  BP Readings from Last 3 Encounters:  07/22/22 138/76  05/13/22 138/86  04/30/22 132/88   Wt Readings from Last 3 Encounters:  07/22/22 208 lb 9.6 oz (94.6 kg)  05/13/22 206 lb 9.6 oz (93.7 kg)  04/30/22 213 lb 8 oz (96.8 kg)    Physical Exam Vitals reviewed.  Constitutional:      Appearance: He is well-developed.  Cardiovascular:     Rate and Rhythm: Regular rhythm.     Heart sounds: Normal heart sounds.  Pulmonary:     Effort: Pulmonary effort is normal. No respiratory distress.     Breath sounds: Normal breath sounds. No wheezing or rales.  Musculoskeletal:     Lumbar back: No swelling, spasms or tenderness. Normal range of motion.     Comments: Full range of motion with flexion, extension, lateral side bends. No pain, numbness, tingling elicited with single leg raise bilaterally. No rash.  Skin:    General: Skin is warm and dry.     Comments: Bilateral cracking seen at corners of mouth.  No purulent discharge, white exudate  Neurological:      Mental Status: He is alert.  Psychiatric:        Speech: Speech normal.        Behavior: Behavior normal.

## 2022-08-11 ENCOUNTER — Encounter: Payer: Self-pay | Admitting: Family

## 2022-08-11 ENCOUNTER — Ambulatory Visit: Payer: 59 | Admitting: Family

## 2022-08-11 VITALS — BP 124/76 | HR 88 | Temp 97.6°F | Ht 73.0 in | Wt 209.4 lb

## 2022-08-11 DIAGNOSIS — L249 Irritant contact dermatitis, unspecified cause: Secondary | ICD-10-CM

## 2022-08-11 DIAGNOSIS — M546 Pain in thoracic spine: Secondary | ICD-10-CM

## 2022-08-11 DIAGNOSIS — L240 Irritant contact dermatitis due to detergents: Secondary | ICD-10-CM | POA: Diagnosis not present

## 2022-08-11 DIAGNOSIS — L259 Unspecified contact dermatitis, unspecified cause: Secondary | ICD-10-CM | POA: Insufficient documentation

## 2022-08-11 LAB — URINALYSIS, ROUTINE W REFLEX MICROSCOPIC
Bilirubin Urine: NEGATIVE
Hgb urine dipstick: NEGATIVE
Ketones, ur: NEGATIVE
Leukocytes,Ua: NEGATIVE
Nitrite: NEGATIVE
RBC / HPF: NONE SEEN (ref 0–?)
Specific Gravity, Urine: 1.02 (ref 1.000–1.030)
Total Protein, Urine: NEGATIVE
Urine Glucose: >=1000 — AB
Urobilinogen, UA: 0.2 (ref 0.0–1.0)
WBC, UA: NONE SEEN (ref 0–?)
pH: 6 (ref 5.0–8.0)

## 2022-08-11 MED ORDER — MELOXICAM 15 MG PO TABS
15.0000 mg | ORAL_TABLET | Freq: Every day | ORAL | 0 refills | Status: DC | PRN
Start: 2022-08-11 — End: 2022-09-02

## 2022-08-11 MED ORDER — TRIAMCINOLONE ACETONIDE 0.5 % EX OINT
1.0000 | TOPICAL_OINTMENT | Freq: Two times a day (BID) | CUTANEOUS | 2 refills | Status: DC
Start: 2022-08-11 — End: 2023-09-16

## 2022-08-11 MED ORDER — CYCLOBENZAPRINE HCL 5 MG PO TABS
5.0000 mg | ORAL_TABLET | Freq: Every evening | ORAL | 1 refills | Status: DC | PRN
Start: 2022-08-11 — End: 2023-05-18

## 2022-08-11 NOTE — Assessment & Plan Note (Addendum)
Presentation consistent with musculoskeletal etiology.  Reassuring abdominal exam.  Pending abdominal plain film, urinalysis due to history of renal stone.  Trial of meloxicam as Tylenol not particularly effective.  Provided Flexeril to use at bedtime.  Discussed sedation of Flexeril.  Referral to physical therapy.  Close follow-up

## 2022-08-11 NOTE — Progress Notes (Signed)
Assessment & Plan:  Acute right-sided thoracic back pain Assessment & Plan: Presentation consistent with musculoskeletal etiology.  Reassuring abdominal exam.  Pending abdominal plain film, urinalysis due to history of renal stone.  Trial of meloxicam as Tylenol not particularly effective.  Provided Flexeril to use at bedtime.  Discussed sedation of Flexeril.  Referral to physical therapy.  Close follow-up  Orders: -     Basic metabolic panel -     Urinalysis, Routine w reflex microscopic -     DG Lumbar Spine Complete; Future -     DG Thoracic Spine W/Swimmers; Future -     DG Abd 2 Views; Future -     Meloxicam; Take 1 tablet (15 mg total) by mouth daily as needed for pain.  Dispense: 30 tablet; Refill: 0 -     Cyclobenzaprine HCl; Take 1-2 tablets (5-10 mg total) by mouth at bedtime as needed for muscle spasms.  Dispense: 30 tablet; Refill: 1 -     Ambulatory referral to Physical Therapy  Irritant contact dermatitis due to detergent Assessment & Plan: Presentation consistent with contact dermatitis versus atopic dermatitis.  Trial of triamcinolone. Advised to stop over-the-counter lotions and creams as concern may be aggravating as well.  Discussed hand sanitizer and how drying it can be  Orders: -     Triamcinolone Acetonide; Apply 1 Application topically 2 (two) times daily. Use for < 1 week. Do not use face, genitals  Dispense: 15 g; Refill: 2  Irritant dermatitis     Return precautions given.   Risks, benefits, and alternatives of the medications and treatment plan prescribed today were discussed, and patient expressed understanding.   Education regarding symptom management and diagnosis given to patient on AVS either electronically or printed.  Return in about 2 weeks (around 08/25/2022).  Rennie Plowman, FNP  Subjective:    Patient ID: Ronald Pruitt., male    DOB: 18-Feb-1970, 52 y.o.   MRN: [id number]  CC: Tyr Franca. is a 52 y.o. male who presents  today for an acute visit.    HPI: Complaints of small red bumps sun exposed area of bilateral lower arms x 3 weeks, improving..  Nontender. Rash is not itchy.    No new detergents, new medications or environmental exposure.   He washes his hand washes his hands 30 times per day; he uses hand sanitizer.   He using lubriderm lotion otc for 'dry skin' and eczema which he uses on feet, hands and arms.  He has h/o dry skin, eczema.    He is supervisor making fruit rolls.   Complains of right upper abdominal pain which radiates to right mid back one week ago.   Aggravated after walking for long periods of time such as at work. Pain is worse at work and progresses as day goes on with walking at work. Pain with twisting side to side. He only sits down for 'breaks'.   Pain is not aggravated with eating.  No pain with deep inspiration.   Tylenol arthritis helpful at night. He is not taking mobic.   No associated numbness, saddle anesthesia, constipation, nausea, sob, CP, epigastric burning.    H/o cholecystomy.   Colonoscopy is UTD  Compliant with increased dose of losartan 25mg .  He is no longer on hydrochlorothiazide. Leg cramps have resolved.   Seen 3 weeks ago for left sided low back pain He is taking tylenol arthritis  H/o renal stone, 12mm left sided. CT renal 04/24/22  Xray lumbar spine and thoracic spine 07/25/21  DDD   Allergies: Patient has no known allergies. Current Outpatient Medications on File Prior to Visit  Medication Sig Dispense Refill   amLODipine (NORVASC) 10 MG tablet Take 1 tablet (10 mg total) by mouth daily. 90 tablet 3   atorvastatin (LIPITOR) 80 MG tablet Take 1 tablet (80 mg total) by mouth daily at 6 PM. 90 tablet 1   Blood Glucose Monitoring Suppl (ONETOUCH VERIO) w/Device KIT 1 Device by Does not apply route daily. 1 kit 0   empagliflozin (JARDIANCE) 25 MG TABS tablet Take 1 tablet (25 mg total) by mouth daily before breakfast. 90 tablet 3    ezetimibe (ZETIA) 10 MG tablet Take 1 tablet (10 mg total) by mouth daily. 90 tablet 3   glucose blood (ONETOUCH VERIO) test strip Used to check blood sugar twice a day. 100 each 12   losartan (COZAAR) 25 MG tablet Take 1 tablet (25 mg total) by mouth daily. 90 tablet 3   meloxicam (MOBIC) 15 MG tablet Take 15 mg by mouth daily.     metFORMIN (GLUCOPHAGE-XR) 500 MG 24 hr tablet TAKE 4 TABLETS EVERY EVENING 360 tablet 3   mupirocin ointment (BACTROBAN) 2 % Apply 1 Application topically 2 (two) times daily. 22 g 2   omeprazole (PRILOSEC) 20 MG capsule Take 1 capsule (20 mg total) by mouth daily. 90 capsule 4   OneTouch Delica Lancets 33G MISC Used to check blood sugars twice a day. 100 each 3   Semaglutide (RYBELSUS) 14 MG TABS Take 1 tablet (14 mg total) by mouth daily. 90 tablet 3   tadalafil (CIALIS) 20 MG tablet TAKE 1 TABLET BY MOUTH ONCE DAILY AS NEEDED FOR  ERECTILE  DYSFUNCTION  -  EFFECTS  MAY  LAST  UP  TO  36  HRS 30 tablet 3   tadalafil (CIALIS) 5 MG tablet Take 1 tablet (5 mg total) by mouth daily as needed for erectile dysfunction. 90 tablet 3   No current facility-administered medications on file prior to visit.    Review of Systems  Constitutional:  Negative for chills and fever.  Respiratory:  Negative for cough.   Cardiovascular:  Negative for chest pain and palpitations.  Gastrointestinal:  Negative for constipation, diarrhea, nausea and vomiting.  Genitourinary:  Negative for hematuria.  Musculoskeletal:  Positive for back pain.  Skin:  Positive for rash.  Neurological:  Negative for numbness.      Objective:    BP 124/76   Pulse 88   Temp 97.6 F (36.4 C)   Ht 6\' 1"  (1.854 m)   Wt 209 lb 6.4 oz (95 kg)   SpO2 98%   BMI 27.63 kg/m   BP Readings from Last 3 Encounters:  08/11/22 124/76  07/22/22 138/76  05/13/22 138/86   Wt Readings from Last 3 Encounters:  08/11/22 209 lb 6.4 oz (95 kg)  07/22/22 208 lb 9.6 oz (94.6 kg)  05/13/22 206 lb 9.6 oz (93.7 kg)     Physical Exam Vitals reviewed.  Constitutional:      Appearance: He is well-developed.  Cardiovascular:     Rate and Rhythm: Regular rhythm.     Heart sounds: Normal heart sounds.  Pulmonary:     Effort: Pulmonary effort is normal. No respiratory distress.     Breath sounds: Normal breath sounds. No wheezing, rhonchi or rales.  Abdominal:     General: Bowel sounds are normal.     Palpations: Abdomen is soft.  There is no mass.     Tenderness: There is no right CVA tenderness or left CVA tenderness. Negative signs include Murphy's sign.     Comments: No suprapubic tenderness.   Musculoskeletal:     Thoracic back: Tenderness present. No spasms or bony tenderness. Normal range of motion.     Lumbar back: No swelling, spasms or tenderness. Normal range of motion.       Back:     Comments: Full range of motion with flexion, extension, lateral side bends.  Right upper back discomfort with lateral side bends and when asked from sitting up position to lay supine on exam table.   no pain, numbness, tingling elicited with single leg raise bilaterally. No rash.  Skin:    General: Skin is warm and dry.     Findings: Rash present. Rash is papular.          Comments: Discrete diffuse faint erythematous papular lesions across bilateral forearms, sun exposed area.  No dry patches.  Neurological:     Mental Status: He is alert.  Psychiatric:        Speech: Speech normal.        Behavior: Behavior normal.

## 2022-08-11 NOTE — Patient Instructions (Addendum)
To clear up what is bothering you- I would trial stop the Lubriderm lotion for now.   Trial of topical steroid. Suspect irritant or contact dermatitis.   I have given you muscle relaxant Flexeril to take at bedtime.  As it is sedating, I do not recommend taking it during the day.  Instead of Tylenol arthritis, I would like for you to trial meloxicam to see if more effective for you.  This is a medication that we would plan not to use long-term because of GI side effects   A couple of points in regards to meloxicam ( Mobic) -  This medication is not intended for daily , long term use. It is a potent anti inflammatory ( NSAID), and my intention is for you take as needed for moderate to severe pain. If you find yourself using daily, please let me know.   Please takes Mobic ( meloxicam) with FOOD since it is an anti-inflammatory as it can cause a GI bleed or ulcer. If you have a history of GI bleed or ulcer, please do NOT take.  Do no take over the counter aleve, motrin, advil, goody's powder for pain as they are also NSAIDs, and they are  in the same class as Mobic  Lastly, we will need to monitor kidney function while on Mobic, and if we were to see any decline in kidney function in the future, we would have to discontinue this medication.   I placed a referral to physical therapy  Let us know if you dont hear back within a week in regards to an appointment being scheduled.   So that you are aware, if you are [facility] MyChart user , please pay attention to your MyChart messages as you may receive a MyChart message with a phone number to call and schedule this test/appointment own your own from our referral coordinator. This is a new process so I do not want you to miss this message.  If you are not a MyChart user, you will receive a phone call.

## 2022-08-11 NOTE — Assessment & Plan Note (Signed)
Presentation consistent with contact dermatitis versus atopic dermatitis.  Trial of triamcinolone. Advised to stop over-the-counter lotions and creams as concern may be aggravating as well.  Discussed hand sanitizer and how drying it can be

## 2022-08-14 ENCOUNTER — Ambulatory Visit (INDEPENDENT_AMBULATORY_CARE_PROVIDER_SITE_OTHER): Payer: 59

## 2022-08-14 ENCOUNTER — Other Ambulatory Visit: Payer: Self-pay | Admitting: Family

## 2022-08-14 ENCOUNTER — Other Ambulatory Visit: Payer: 59

## 2022-08-14 DIAGNOSIS — M546 Pain in thoracic spine: Secondary | ICD-10-CM

## 2022-08-14 DIAGNOSIS — L240 Irritant contact dermatitis due to detergents: Secondary | ICD-10-CM

## 2022-08-14 DIAGNOSIS — L249 Irritant contact dermatitis, unspecified cause: Secondary | ICD-10-CM

## 2022-08-17 ENCOUNTER — Encounter: Payer: Self-pay | Admitting: Family

## 2022-08-18 ENCOUNTER — Other Ambulatory Visit: Payer: 59

## 2022-08-20 ENCOUNTER — Telehealth: Payer: Self-pay | Admitting: Family

## 2022-08-20 NOTE — Telephone Encounter (Signed)
Spoke to [facility] radiology and she is going to have it released into chart

## 2022-08-20 NOTE — Telephone Encounter (Signed)
Call radiology for xr abdominal pain, change priority to stat as pt is having intense back pain I am concerned for stone  Let pt know we are working on this and also Ronald Pruitt called physical therapy yesterday to get update on referral

## 2022-08-21 ENCOUNTER — Ambulatory Visit: Payer: 59 | Admitting: Urology

## 2022-08-22 ENCOUNTER — Other Ambulatory Visit: Payer: Self-pay

## 2022-08-22 ENCOUNTER — Telehealth: Payer: Self-pay | Admitting: Family

## 2022-08-22 DIAGNOSIS — N401 Enlarged prostate with lower urinary tract symptoms: Secondary | ICD-10-CM

## 2022-08-22 NOTE — Telephone Encounter (Signed)
Left message   Wanted to discuss back pain

## 2022-08-25 ENCOUNTER — Encounter: Payer: Self-pay | Admitting: Family

## 2022-08-25 ENCOUNTER — Other Ambulatory Visit: Payer: Self-pay

## 2022-08-25 ENCOUNTER — Other Ambulatory Visit: Payer: 59

## 2022-08-25 DIAGNOSIS — N401 Enlarged prostate with lower urinary tract symptoms: Secondary | ICD-10-CM

## 2022-08-25 DIAGNOSIS — I10 Essential (primary) hypertension: Secondary | ICD-10-CM

## 2022-08-25 MED ORDER — AMLODIPINE BESYLATE 10 MG PO TABS
10.0000 mg | ORAL_TABLET | Freq: Every day | ORAL | 3 refills | Status: DC
Start: 2022-08-25 — End: 2023-05-26

## 2022-08-25 NOTE — Telephone Encounter (Signed)
Noted  

## 2022-08-27 NOTE — Progress Notes (Signed)
08/28/2022 4:52 PM   Ronald Pruitt. [birth year] [id number]  Referring provider: Allegra Grana, FNP [address] [place],  [place] [address]  Urological history: 1.  Testosterone deficiency  - contributing factors of age, diabetes and obesity - Testosterone level normalized without treatment    ED  - contributing factors of age, BPH, testosterone deficiency, DM, HTN, HLD, sleep apnea, and stress - tadalafil 20 mg, on-demand dosing   3. BPH with LUTS  - PSA (07/2022) 0.3 - prostate volume ~ 40 cc - tadalafil 5 mg daily   4. Nephrolithiasis  - stone composition 80% calcium oxalate monohydrate and 20% calcium oxalate dihydrate - s/p ESWL left renal stone 2021 - s/p ESWL left renal stone 09/20/2020 - KUB (06/2021) - stable 12 mm LEFT renal  Chief Complaint  Patient presents with   Follow-up    HPI: Ronald Pruitt. is a 52 y.o. male who presents today for yearly visit.    Previous records reviewed.   I saw him in April for symptoms of urine stream spraying and straining to urinate.  He was given a trial of tadalafil 5 mg daily and tamsulosin 0.4 mg daily which did not provide any improvement in his symptoms.  He then was scheduled for cystoscopy for further evaluation of his symptoms, but he canceled the procedure.  He has been having right sided flank pain and is wondering if it has something to do with his stone.  He has a stable left renal stone, but there have been no stones on thei right.    I PSS 14/3  He states his urinary symptoms have resolved.  Patient denies any modifying or aggravating factors.  Patient denies any recent UTI's, gross hematuria, dysuria or suprapubic/flank pain.  Patient denies any fevers, chills, nausea or vomiting.    IPSS     Row Name 08/31/22 1600         International Prostate Symptom Score   How often have you had the sensation of not emptying your bladder? Less than 1 in 5     How often have you had to  urinate less than every two hours? Less than 1 in 5 times     How often have you found you stopped and started again several times when you urinated? Not at All     How often have you found it difficult to postpone urination? Not at All     How often have you had a weak urinary stream? Less than 1 in 5 times     How often have you had to strain to start urination? Less than 1 in 5 times     How many times did you typically get up at night to urinate? 2 Times     Total IPSS Score 6       Quality of Life due to urinary symptoms   If you were to spend the rest of your life with your urinary condition just the way it is now how would you feel about that? Mixed              IPSS     Row Name 08/31/22 1600         International Prostate Symptom Score   How often have you had the sensation of not emptying your bladder? Less than 1 in 5     How often have you had to urinate less than every two hours? Less than 1 in  5 times     How often have you found you stopped and started again several times when you urinated? Not at All     How often have you found it difficult to postpone urination? Not at All     How often have you had a weak urinary stream? Less than 1 in 5 times     How often have you had to strain to start urination? Less than 1 in 5 times     How many times did you typically get up at night to urinate? 2 Times     Total IPSS Score 6       Quality of Life due to urinary symptoms   If you were to spend the rest of your life with your urinary condition just the way it is now how would you feel about that? Mixed              IPSS     Row Name 08/31/22 1600         International Prostate Symptom Score   How often have you had the sensation of not emptying your bladder? Less than 1 in 5     How often have you had to urinate less than every two hours? Less than 1 in 5 times     How often have you found you stopped and started again several times when you urinated? Not at  All     How often have you found it difficult to postpone urination? Not at All     How often have you had a weak urinary stream? Less than 1 in 5 times     How often have you had to strain to start urination? Less than 1 in 5 times     How many times did you typically get up at night to urinate? 2 Times     Total IPSS Score 6       Quality of Life due to urinary symptoms   If you were to spend the rest of your life with your urinary condition just the way it is now how would you feel about that? Mixed              Score:  1-7 Mild 8-19 Moderate 20-35 Severe     SHIM 14  Patient still having spontaneous erections.  He denies any pain or curvature with erections.      SHIM     Row Name 08/28/22 1436         SHIM: Over the last 6 months:   How do you rate your confidence that you could get and keep an erection? Low     When you had erections with sexual stimulation, how often were your erections hard enough for penetration (entering your partner)? Sometimes (about half the time)     During sexual intercourse, how often were you able to maintain your erection after you had penetrated (entered) your partner? Sometimes (about half the time)     During sexual intercourse, how difficult was it to maintain your erection to completion of intercourse? Difficult     When you attempted sexual intercourse, how often was it satisfactory for you? Sometimes (about half the time)       SHIM Total Score   SHIM 14              Score: 1-7 Severe ED 8-11 Moderate ED 12-16 Mild-Moderate ED 17-21 Mild ED 22-25 No ED    PMH:  Past Medical History:  Diagnosis Date   Arthritis    BPH (benign prostatic hyperplasia)    Diabetes (HCC)    Dysuria    GERD (gastroesophageal reflux disease)    History of kidney stones    HTN (hypertension)    Hyperlipidemia    Impotence    Nocturia    Obesity    Pneumonia age 23's   Sleep apnea    CPAP - severe (sleep study 01/11/16)    Testicular hypofunction    Wears dentures    full upper and lower    Surgical History: Past Surgical History:  Procedure Laterality Date   CHOLECYSTECTOMY     COLONOSCOPY WITH PROPOFOL N/A 10/12/2018   Procedure: COLONOSCOPY WITH PROPOFOL;  Surgeon: Midge Minium, MD;  Location: [facility];  Service: Endoscopy;  Laterality: N/A;   EXTRACORPOREAL SHOCK WAVE LITHOTRIPSY Left 09/29/2019   Procedure: EXTRACORPOREAL SHOCK WAVE LITHOTRIPSY (ESWL);  Surgeon: Vanna Scotland, MD;  Location: [facility];  Service: Urology;  Laterality: Left;   EXTRACORPOREAL SHOCK WAVE LITHOTRIPSY Left 09/20/2020   Procedure: EXTRACORPOREAL SHOCK WAVE LITHOTRIPSY (ESWL);  Surgeon: Vanna Scotland, MD;  Location: [facility];  Service: Urology;  Laterality: Left;   NASAL SEPTOPLASTY W/ TURBINOPLASTY N/A 05/20/2017   Procedure: NASAL SEPTOPLASTY WITH SUBMUCOUS RESECTION;  Surgeon: Geanie Logan, MD;  Location: [facility];  Service: ENT;  Laterality: N/A;   VASECTOMY      Home Medications:  Allergies as of 08/28/2022   No Known Allergies      Medication List        Accurate as of August 28, 2022 11:59 PM. If you have any questions, ask your nurse or doctor.          amLODipine 10 MG tablet Commonly known as: NORVASC Take 1 tablet (10 mg total) by mouth daily.   atorvastatin 80 MG tablet Commonly known as: LIPITOR Take 1 tablet (80 mg total) by mouth daily at 6 PM.   cyclobenzaprine 5 MG tablet Commonly known as: FLEXERIL Take 1-2 tablets (5-10 mg total) by mouth at bedtime as needed for muscle spasms.   empagliflozin 25 MG Tabs tablet Commonly known as: Jardiance Take 1 tablet (25 mg total) by mouth daily before breakfast.   ezetimibe 10 MG tablet Commonly known as: ZETIA Take 1 tablet (10 mg total) by mouth daily.   losartan 25 MG tablet Commonly known as: COZAAR Take 1 tablet (25 mg total) by mouth daily.   meloxicam 15 MG tablet Commonly known as: MOBIC Take 15 mg by mouth daily.   meloxicam  15 MG tablet Commonly known as: MOBIC Take 1 tablet (15 mg total) by mouth daily as needed for pain.   metFORMIN 500 MG 24 hr tablet Commonly known as: GLUCOPHAGE-XR TAKE 4 TABLETS EVERY EVENING   mupirocin ointment 2 % Commonly known as: BACTROBAN Apply 1 Application topically 2 (two) times daily.   omeprazole 20 MG capsule Commonly known as: PRILOSEC Take 1 capsule (20 mg total) by mouth daily.   OneTouch Delica Lancets 33G Misc Used to check blood sugars twice a day.   OneTouch Verio test strip Generic drug: glucose blood Used to check blood sugar twice a day.   OneTouch Verio w/Device Kit 1 Device by Does not apply route daily.   Rybelsus 14 MG Tabs Generic drug: Semaglutide Take 1 tablet (14 mg total) by mouth daily.   tadalafil 5 MG tablet Commonly known as: CIALIS Take 1 tablet (5 mg total) by mouth daily as needed for  erectile dysfunction.   tadalafil 20 MG tablet Commonly known as: CIALIS TAKE 1 TABLET BY MOUTH ONCE DAILY AS NEEDED FOR  ERECTILE  DYSFUNCTION  -  EFFECTS  MAY  LAST  UP  TO  36  HRS   triamcinolone ointment 0.5 % Commonly known as: KENALOG Apply 1 Application topically 2 (two) times daily. Use for < 1 week. Do not use face, genitals        Allergies: No Known Allergies  Family History: Family History  Problem Relation Age of Onset   Hyperlipidemia Mother    Diabetes Mellitus II Mother    Seizures Mother    Diabetes Mother    Mental illness Mother    Arthritis Mother    Diabetes Father    Cirrhosis Father        non alcoholic   Hyperlipidemia Father    Arthritis Father    Liver cancer Father    Diabetes Maternal Grandmother    Hyperlipidemia Maternal Grandmother    Arthritis Maternal Grandmother    Diabetes Maternal Grandfather    Hyperlipidemia Maternal Grandfather    Arthritis Maternal Grandfather    Diabetes Paternal Grandmother    Hyperlipidemia Paternal Grandmother    Arthritis Paternal Grandmother    Diabetes  Paternal Grandfather    Hyperlipidemia Paternal Grandfather    Arthritis Paternal Grandfather    Kidney disease Neg Hx    Prostate cancer Neg Hx    Kidney cancer Neg Hx    Bladder Cancer Neg Hx    Colon cancer Neg Hx    Thyroid cancer Neg Hx     Social History:  reports that he has been smoking cigarettes. He has a 10 pack-year smoking history. He has never used smokeless tobacco. He reports that he does not drink alcohol and does not use drugs.  ROS: Pertinent ROS in HPI  Physical Exam: BP 118/80   Pulse 96   Ht 6\' 1"  (1.854 m)   Wt 208 lb (94.3 kg)   BMI 27.44 kg/m   Constitutional:  Well nourished. Alert and oriented, No acute distress. HEENT: [place] AT, moist mucus membranes.  Trachea midline Cardiovascular: No clubbing, cyanosis, or edema. Respiratory: Normal respiratory effort, no increased work of breathing. Neurologic: Grossly intact, no focal deficits, moving all 4 extremities. Psychiatric: Normal mood and affect.  Laboratory Data: Results for orders placed or performed in visit on 08/25/22  PSA  Result Value Ref Range   Prostate Specific Ag, Serum 0.3 0.0 - 4.0 ng/mL    Lab Results  Component Value Date   WBC 8.9 04/11/2022   HGB 14.8 04/11/2022   HCT 44.9 04/11/2022   MCV 82.1 04/11/2022   PLT 220.0 04/11/2022   Lab Results  Component Value Date   CREATININE 0.76 04/11/2022    Lab Results  Component Value Date   HGBA1C 7.4 (A) 07/22/2022   Lab Results  Component Value Date   TSH 1.18 04/11/2022       Component Value Date/Time   CHOL 192 04/11/2022 0922   HDL 32.30 (L) 04/11/2022 0922   CHOLHDL 6 04/11/2022 0922   VLDL 20.8 04/11/2022 0922   LDLCALC 139 (H) 04/11/2022 0922    Lab Results  Component Value Date   AST 17 04/11/2022   Lab Results  Component Value Date   ALT 17 04/11/2022    Urinalysis    Component Value Date/Time   COLORURINE YELLOW 08/11/2022 0853   APPEARANCEUR CLEAR 08/11/2022 0853   APPEARANCEUR Clear 04/10/2022  0955   LABSPEC 1.020 08/11/2022 0853   PHURINE 6.0 08/11/2022 0853   GLUCOSEU >=1000 (A) 08/11/2022 0853   HGBUR NEGATIVE 08/11/2022 0853   BILIRUBINUR NEGATIVE 08/11/2022 0853   BILIRUBINUR Negative 04/10/2022 0955   KETONESUR NEGATIVE 08/11/2022 0853   PROTEINUR Negative 04/10/2022 0955   PROTEINUR NEGATIVE 03/21/2018 2200   UROBILINOGEN 0.2 08/11/2022 0853   NITRITE NEGATIVE 08/11/2022 0853   LEUKOCYTESUR NEGATIVE 08/11/2022 0853  I have reviewed the labs.   Pertinent Imaging: N/A  Assessment & Plan:    1. BPH with LUTS -PSA stable  -Continue Cialis 5 mg daily   2. Erectile dysfunction -continue tadalafil 20 mg, on-demand-dosing  3. Renal stone -stable left renal stone  -explained that he had a CT in March of this year and no right sided stones were seen and the left renal stone would not be causing his right-sided back pain    Return in about 1 year (around 08/28/2023) for IPSS, SHIM, PSA and exam.  These notes generated with voice recognition software. I apologize for typographical errors.  Cloretta Ned  [facility] [address]  [address] [place], [place] [address] [phone]

## 2022-08-28 ENCOUNTER — Encounter: Payer: Self-pay | Admitting: Urology

## 2022-08-28 ENCOUNTER — Ambulatory Visit: Payer: 59 | Admitting: Urology

## 2022-08-28 VITALS — BP 118/80 | HR 96 | Ht 73.0 in | Wt 208.0 lb

## 2022-08-28 DIAGNOSIS — N401 Enlarged prostate with lower urinary tract symptoms: Secondary | ICD-10-CM

## 2022-08-28 DIAGNOSIS — N200 Calculus of kidney: Secondary | ICD-10-CM

## 2022-08-28 DIAGNOSIS — N529 Male erectile dysfunction, unspecified: Secondary | ICD-10-CM

## 2022-08-28 DIAGNOSIS — N138 Other obstructive and reflux uropathy: Secondary | ICD-10-CM

## 2022-08-28 NOTE — Telephone Encounter (Signed)
noted 

## 2022-09-02 ENCOUNTER — Ambulatory Visit: Payer: 59 | Admitting: Family

## 2022-09-02 ENCOUNTER — Encounter: Payer: Self-pay | Admitting: Family

## 2022-09-02 DIAGNOSIS — M546 Pain in thoracic spine: Secondary | ICD-10-CM | POA: Diagnosis not present

## 2022-09-02 MED ORDER — MELOXICAM 15 MG PO TABS
15.0000 mg | ORAL_TABLET | Freq: Every day | ORAL | 1 refills | Status: DC | PRN
Start: 2022-09-02 — End: 2023-05-18

## 2022-09-02 NOTE — Progress Notes (Signed)
Assessment & Plan:  Acute right-sided thoracic back pain Assessment & Plan: Improved.  Aggravated by activity.  Meloxicam and Flexeril as needed have worked quite well.  I provide refill of meloxicam 15 mg as needed and counseled him on using this medication sparingly, and with  food. patient is scheduled to see physical therapist in the next couple weeks.  He politely declines further evaluation at this time and will let me know how he is doing  Orders: -     Meloxicam; Take 1 tablet (15 mg total) by mouth daily as needed for pain.  Dispense: 30 tablet; Refill: 1     Return precautions given.   Risks, benefits, and alternatives of the medications and treatment plan prescribed today were discussed, and patient expressed understanding.   Education regarding symptom management and diagnosis given to patient on AVS either electronically or printed.  No follow-ups on file.  Rennie Plowman, FNP  Subjective:    Patient ID: Ronald Loth., male    DOB: 07-Feb-1970, 52 y.o.   MRN: [id number]  CC: Ronald Digges. is a 52 y.o. male who presents today for follow up in 6-week follow-up for right-sided constant back pain.   HPI: Right sided thoracic back pain greatly improved.  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 Relief with flexeril and mobic that night.  No saddle anesthesia, numbness in legs, N.  Pain is not associated with eating.    Abdominal x-ray showed stable left renal calculus.  Mild arthritic changes of the thoracic and lumbar spine. Pending referral to physical therapy Allergies: Patient has no known allergies. Current Outpatient Medications on File Prior to Visit  Medication Sig Dispense Refill   amLODipine (NORVASC) 10 MG tablet Take 1 tablet (10 mg total) by mouth daily. 90 tablet 3   atorvastatin (LIPITOR) 80  MG tablet Take 1 tablet (80 mg total) by mouth daily at 6 PM. 90 tablet 1   Blood Glucose Monitoring Suppl (ONETOUCH VERIO) w/Device KIT 1 Device by Does not apply route daily. 1 kit 0   cyclobenzaprine (FLEXERIL) 5 MG tablet Take 1-2 tablets (5-10 mg total) by mouth at bedtime as needed for muscle spasms. 30 tablet 1   empagliflozin (JARDIANCE) 25 MG TABS tablet Take 1 tablet (25 mg total) by mouth daily before breakfast. 90 tablet 3   ezetimibe (ZETIA) 10 MG tablet Take 1 tablet (10 mg total) by mouth daily. 90 tablet 3   glucose blood (ONETOUCH VERIO) test strip Used to check blood sugar twice a day. 100 each 12   losartan (COZAAR) 25 MG tablet Take 1 tablet (25 mg total) by mouth daily. 90 tablet 3   meloxicam (MOBIC) 15 MG tablet Take 15 mg by mouth daily.     metFORMIN (GLUCOPHAGE-XR) 500 MG 24 hr tablet TAKE 4 TABLETS EVERY EVENING 360 tablet 3   mupirocin ointment (BACTROBAN) 2 % Apply 1 Application topically 2 (two) times daily. 22 g 2   omeprazole (PRILOSEC) 20 MG capsule Take 1 capsule (20 mg total) by mouth daily. 90 capsule 4   OneTouch Delica Lancets 33G MISC Used to check blood sugars twice a day. 100 each 3   Semaglutide (RYBELSUS) 14 MG TABS Take 1 tablet (14 mg total) by mouth daily. 90 tablet 3   tadalafil (CIALIS) 20  MG tablet TAKE 1 TABLET BY MOUTH ONCE DAILY AS NEEDED FOR  ERECTILE  DYSFUNCTION  -  EFFECTS  MAY  LAST  UP  TO  36  HRS 30 tablet 3   tadalafil (CIALIS) 5 MG tablet Take 1 tablet (5 mg total) by mouth daily as needed for erectile dysfunction. 90 tablet 3   triamcinolone ointment (KENALOG) 0.5 % Apply 1 Application topically 2 (two) times daily. Use for < 1 week. Do not use face, genitals 15 g 2   No current facility-administered medications on file prior to visit.    Review of Systems  Constitutional:  Negative for chills and fever.  Respiratory:  Negative for cough.   Cardiovascular:  Negative for chest pain and palpitations.  Gastrointestinal:  Negative for  constipation, nausea and vomiting.  Musculoskeletal:  Positive for back pain.  Neurological:  Negative for numbness.      Objective:    BP 128/74   Pulse 86   Temp 97.7 F (36.5 C) (Oral)   Ht 6\' 1"  (1.854 m)   Wt 209 lb (94.8 kg)   SpO2 96%   BMI 27.57 kg/m  BP Readings from Last 3 Encounters:  09/02/22 128/74  08/28/22 118/80  08/11/22 124/76   Wt Readings from Last 3 Encounters:  09/02/22 209 lb (94.8 kg)  08/28/22 208 lb (94.3 kg)  08/11/22 209 lb 6.4 oz (95 kg)    Physical Exam Vitals reviewed.  Constitutional:      Appearance: He is well-developed.  Cardiovascular:     Rate and Rhythm: Regular rhythm.     Heart sounds: Normal heart sounds.  Pulmonary:     Effort: Pulmonary effort is normal. No respiratory distress.     Breath sounds: Normal breath sounds. No wheezing, rhonchi or rales.  Musculoskeletal:     Thoracic back: No swelling, lacerations, tenderness or bony tenderness. Normal range of motion.  Skin:    General: Skin is warm and dry.  Neurological:     Mental Status: He is alert.  Psychiatric:        Speech: Speech normal.        Behavior: Behavior normal.

## 2022-09-02 NOTE — Assessment & Plan Note (Addendum)
Improved.  Aggravated by activity.  Meloxicam and Flexeril as needed have worked quite well.  I provide refill of meloxicam 15 mg as needed and counseled him on using this medication sparingly, and with  food. patient is scheduled to see physical therapist in the next couple weeks.  He politely declines further evaluation at this time and will let me know how he is doing

## 2022-09-11 ENCOUNTER — Encounter (INDEPENDENT_AMBULATORY_CARE_PROVIDER_SITE_OTHER): Payer: Self-pay

## 2022-09-26 ENCOUNTER — Encounter: Payer: Self-pay | Admitting: Family

## 2022-09-26 ENCOUNTER — Other Ambulatory Visit: Payer: Self-pay

## 2022-09-26 DIAGNOSIS — K219 Gastro-esophageal reflux disease without esophagitis: Secondary | ICD-10-CM

## 2022-09-26 MED ORDER — OMEPRAZOLE 20 MG PO CPDR
20.0000 mg | DELAYED_RELEASE_CAPSULE | Freq: Every day | ORAL | 4 refills | Status: DC
Start: 2022-09-26 — End: 2023-11-09

## 2022-09-30 ENCOUNTER — Ambulatory Visit: Payer: 59 | Attending: Family | Admitting: Physical Therapy

## 2022-10-02 ENCOUNTER — Ambulatory Visit: Payer: 59 | Admitting: Physical Therapy

## 2022-10-06 ENCOUNTER — Ambulatory Visit: Payer: 59 | Admitting: Physical Therapy

## 2022-10-08 ENCOUNTER — Encounter: Payer: 59 | Admitting: Physical Therapy

## 2022-10-13 ENCOUNTER — Encounter: Payer: 59 | Admitting: Physical Therapy

## 2022-10-16 ENCOUNTER — Encounter: Payer: 59 | Admitting: Physical Therapy

## 2022-10-20 ENCOUNTER — Encounter: Payer: 59 | Admitting: Physical Therapy

## 2022-10-23 ENCOUNTER — Encounter: Payer: 59 | Admitting: Physical Therapy

## 2022-10-23 ENCOUNTER — Ambulatory Visit: Payer: 59 | Admitting: Family

## 2022-10-24 ENCOUNTER — Ambulatory Visit: Payer: 59 | Admitting: Family

## 2022-11-25 ENCOUNTER — Telehealth: Payer: Self-pay

## 2022-11-25 ENCOUNTER — Other Ambulatory Visit (HOSPITAL_COMMUNITY): Payer: Self-pay

## 2022-11-25 NOTE — Telephone Encounter (Signed)
Pharmacy Patient Advocate Encounter   Received notification from CoverMyMeds that prior authorization for Rybelsus is required/requested.   Insurance verification completed.   The patient is insured through [employer] [facility] .   Per test claim: PA required; PA submitted to [employer] [facility] via CoverMyMeds Key/confirmation #/EOC Key: [id number] Status is pending

## 2022-11-25 NOTE — Telephone Encounter (Signed)
Pharmacy Patient Advocate Encounter  Received notification from [employer] [facility] that Prior Authorization for Rybelsus 3MG  tablets has been APPROVED from 11/25/2022 to 11/24/2025. Ran test claim, Copay is $90.00 for a 90 day supply. This test claim was processed through [facility]- copay amounts may vary at other pharmacies due to pharmacy/plan contracts, or as the patient moves through the different stages of their insurance plan.   PA #/Case ID/Reference #: [id number]

## 2022-12-24 ENCOUNTER — Ambulatory Visit: Payer: 59 | Admitting: Family

## 2022-12-24 ENCOUNTER — Encounter: Payer: Self-pay | Admitting: Family

## 2022-12-24 VITALS — BP 120/82 | HR 99 | Temp 98.0°F | Ht 73.0 in | Wt 206.8 lb

## 2022-12-24 DIAGNOSIS — I700 Atherosclerosis of aorta: Secondary | ICD-10-CM | POA: Diagnosis not present

## 2022-12-24 DIAGNOSIS — E1140 Type 2 diabetes mellitus with diabetic neuropathy, unspecified: Secondary | ICD-10-CM

## 2022-12-24 DIAGNOSIS — H9201 Otalgia, right ear: Secondary | ICD-10-CM | POA: Diagnosis not present

## 2022-12-24 DIAGNOSIS — Z794 Long term (current) use of insulin: Secondary | ICD-10-CM

## 2022-12-24 DIAGNOSIS — I10 Essential (primary) hypertension: Secondary | ICD-10-CM

## 2022-12-24 LAB — POCT GLYCOSYLATED HEMOGLOBIN (HGB A1C)
HbA1c POC (<> result, manual entry): 6.3 % (ref 4.0–5.6)
HbA1c, POC (controlled diabetic range): 6.3 % (ref 0.0–7.0)
HbA1c, POC (prediabetic range): 6.3 % (ref 5.7–6.4)
Hemoglobin A1C: 6.3 % — AB (ref 4.0–5.6)

## 2022-12-24 MED ORDER — AMOXICILLIN-POT CLAVULANATE 875-125 MG PO TABS
1.0000 | ORAL_TABLET | Freq: Two times a day (BID) | ORAL | 0 refills | Status: AC
Start: 2022-12-24 — End: 2022-12-31

## 2022-12-24 MED ORDER — SEMAGLUTIDE(0.25 OR 0.5MG/DOS) 2 MG/3ML ~~LOC~~ SOPN: 0.5000 mg | PEN_INJECTOR | SUBCUTANEOUS | 2 refills | Status: DC

## 2022-12-24 NOTE — Patient Instructions (Addendum)
Stop Rybelsus 14 mg and start Ozempic 0.5mg   [email or web address]  Please need to monitor for right ear swelling or tenderness.  If persists, please start Augmentin and let me know right away so that I can obtain imaging.

## 2022-12-24 NOTE — Progress Notes (Signed)
Assessment & Plan:  Type 2 diabetes mellitus with diabetic neuropathy, with long-term current use of insulin (HCC) -     POCT glycosylated hemoglobin (Hb A1C) -     Semaglutide(0.25 or 0.5MG /DOS); Inject 0.5 mg into the skin once a week.  Dispense: 3 mL; Refill: 2  Aortic atherosclerosis (HCC) -     Comprehensive metabolic panel; Future -     Lipid panel; Future  Right ear pain Assessment & Plan: Reassuring exam.  Discussed more likely related to trauma after fall. Provided Augmentin due to subtle asymmetry of parotid gland on exam.  Advised patient to let me know if unilateral swelling persist his appointment recommend CT parotid gland  Orders: -     Amoxicillin-Pot Clavulanate; Take 1 tablet by mouth 2 (two) times daily for 7 days.  Dispense: 14 tablet; Refill: 0  Hypertension, unspecified type Assessment & Plan: Chronic, stable.  Continue losartan 25 mg daily, amlodipine 10 mg daily.    DM  Lab Results  Component Value Date   HGBA1C 6.3 (A) 12/24/2022   HGBA1C 6.3 12/24/2022   HGBA1C 6.3 12/24/2022   HGBA1C 6.3 12/24/2022  Improved, stable.  He prefers for ease of administration and glycemic control to stop Rybelsus and start Ozempic again.  Resume Ozempic 0.5 mg.  Continue jardiance 25 mg daily, metformin 2000 mg QD.   Return precautions given.   Risks, benefits, and alternatives of the medications and treatment plan prescribed today were discussed, and patient expressed understanding.   Education regarding symptom management and diagnosis given to patient on AVS either electronically or printed.  Return in about 3 months (around 03/26/2023) for Fasting labs in 2-3 weeks.  Rennie Plowman, FNP  Subjective:    Patient ID: Ronald Loth., male    DOB: Sep 18, 1970, 52 y.o.   MRN: [id number]  CC: Ronald Pruitt. is a 52 y.o. male who presents today for an acute visit.    HPI: Complains right ear pain x 2 days, unchanged.   Onset after nightmare in which he  rolled off the bed x 4 days ago and hit the nightstand. He has a bruise right side forehead. He went back to sleep after the fall.   Feels swollen outside of ear.   Denies fever, ear discharge, sinus pain.   No pain with opening or closing the jaw  He has tried ibuprofen.    H/o nasal surgery     DM- FBG 180- 200 . He is compliant rybelsus , metformin 2000mg  and metformin.   He would like to swtich back ozempic.     Allergies: Patient has no known allergies. Current Outpatient Medications on File Prior to Visit  Medication Sig Dispense Refill   amLODipine (NORVASC) 10 MG tablet Take 1 tablet (10 mg total) by mouth daily. 90 tablet 3   atorvastatin (LIPITOR) 80 MG tablet Take 1 tablet (80 mg total) by mouth daily at 6 PM. 90 tablet 1   Blood Glucose Monitoring Suppl (ONETOUCH VERIO) w/Device KIT 1 Device by Does not apply route daily. 1 kit 0   cyclobenzaprine (FLEXERIL) 5 MG tablet Take 1-2 tablets (5-10 mg total) by mouth at bedtime as needed for muscle spasms. 30 tablet 1   empagliflozin (JARDIANCE) 25 MG TABS tablet Take 1 tablet (25 mg total) by mouth daily before breakfast. 90 tablet 3   ezetimibe (ZETIA) 10 MG tablet Take 1 tablet (10 mg total) by mouth daily. 90 tablet 3   glucose blood (ONETOUCH  VERIO) test strip Used to check blood sugar twice a day. 100 each 12   losartan (COZAAR) 25 MG tablet Take 1 tablet (25 mg total) by mouth daily. 90 tablet 3   meloxicam (MOBIC) 15 MG tablet Take 15 mg by mouth daily.     meloxicam (MOBIC) 15 MG tablet Take 1 tablet (15 mg total) by mouth daily as needed for pain. 30 tablet 1   metFORMIN (GLUCOPHAGE-XR) 500 MG 24 hr tablet TAKE 4 TABLETS EVERY EVENING 360 tablet 3   mupirocin ointment (BACTROBAN) 2 % Apply 1 Application topically 2 (two) times daily. 22 g 2   omeprazole (PRILOSEC) 20 MG capsule Take 1 capsule (20 mg total) by mouth daily. 90 capsule 4   OneTouch Delica Lancets 33G MISC Used to check blood sugars twice a day. 100  each 3   tadalafil (CIALIS) 20 MG tablet TAKE 1 TABLET BY MOUTH ONCE DAILY AS NEEDED FOR  ERECTILE  DYSFUNCTION  -  EFFECTS  MAY  LAST  UP  TO  36  HRS 30 tablet 3   tadalafil (CIALIS) 5 MG tablet Take 1 tablet (5 mg total) by mouth daily as needed for erectile dysfunction. 90 tablet 3   triamcinolone ointment (KENALOG) 0.5 % Apply 1 Application topically 2 (two) times daily. Use for < 1 week. Do not use face, genitals 15 g 2   No current facility-administered medications on file prior to visit.    Review of Systems  Constitutional:  Negative for chills and fever.  HENT:  Positive for ear pain. Negative for ear discharge, facial swelling, sinus pressure and sinus pain.   Respiratory:  Negative for cough.   Cardiovascular:  Negative for chest pain and palpitations.  Gastrointestinal:  Negative for nausea and vomiting.  Skin:  Positive for color change. Negative for wound.      Objective:    BP 120/82   Pulse 99   Temp 98 F (36.7 C)   Ht 6\' 1"  (1.854 m)   Wt 206 lb 12.8 oz (93.8 kg)   SpO2 95%   BMI 27.28 kg/m   BP Readings from Last 3 Encounters:  12/24/22 120/82  09/02/22 128/74  08/28/22 118/80   Wt Readings from Last 3 Encounters:  12/24/22 206 lb 12.8 oz (93.8 kg)  09/02/22 209 lb (94.8 kg)  08/28/22 208 lb (94.3 kg)    Physical Exam Vitals reviewed.  Constitutional:      Appearance: He is well-developed.  HENT:     Head: Normocephalic and atraumatic. No right periorbital erythema or left periorbital erythema.      Comments: Ecchymosis present right temporal side of head.     Right Ear: Hearing, tympanic membrane, ear canal and external ear normal. No decreased hearing noted. No drainage, swelling or tenderness. No middle ear effusion. Tympanic membrane is not injected, erythematous or bulging.     Left Ear: Hearing, tympanic membrane, ear canal and external ear normal. No decreased hearing noted. No drainage, swelling or tenderness.  No middle ear effusion.  Tympanic membrane is not injected, erythematous or bulging.     Ears:     Comments: No edema around outside ear. No erythema, increased warmth or fluctuance of right parotid gland.  Subtle asymmetry when compared right parotid gland to left.    Nose: Nose normal.     Right Sinus: No maxillary sinus tenderness or frontal sinus tenderness.     Left Sinus: No maxillary sinus tenderness or frontal sinus tenderness.  Mouth/Throat:     Pharynx: Uvula midline. No oropharyngeal exudate or posterior oropharyngeal erythema.     Tonsils: No tonsillar abscesses.  Eyes:     Conjunctiva/sclera: Conjunctivae normal.  Cardiovascular:     Rate and Rhythm: Regular rhythm.     Heart sounds: Normal heart sounds.  Pulmonary:     Effort: Pulmonary effort is normal. No respiratory distress.     Breath sounds: Normal breath sounds. No wheezing, rhonchi or rales.  Lymphadenopathy:     Head:     Right side of head: No submental, submandibular, tonsillar, preauricular, posterior auricular or occipital adenopathy.     Left side of head: No submental, submandibular, tonsillar, preauricular, posterior auricular or occipital adenopathy.     Cervical: No cervical adenopathy.  Skin:    General: Skin is warm and dry.  Neurological:     Mental Status: He is alert.  Psychiatric:        Speech: Speech normal.        Behavior: Behavior normal.

## 2022-12-24 NOTE — Assessment & Plan Note (Addendum)
Reassuring exam.  Discussed more likely related to trauma after fall. Provided Augmentin due to subtle asymmetry of parotid gland on exam.  Advised patient to let me know if unilateral swelling persist his appointment recommend CT parotid gland

## 2022-12-30 NOTE — Assessment & Plan Note (Signed)
Chronic, stable.  Continue losartan 25 mg daily, amlodipine 10 mg daily

## 2022-12-30 NOTE — Assessment & Plan Note (Signed)
Lab Results  Component Value Date   HGBA1C 6.3 (A) 12/24/2022   HGBA1C 6.3 12/24/2022   HGBA1C 6.3 12/24/2022   HGBA1C 6.3 12/24/2022  Improved, stable.  He prefers for ease of administration and glycemic control to stop Rybelsus and start Ozempic again.  Resume Ozempic 0.5 mg.  Continue jardiance 25 mg daily, metformin 2000 mg QD.

## 2023-01-01 ENCOUNTER — Telehealth: Payer: Self-pay

## 2023-01-01 NOTE — Telephone Encounter (Signed)
-----   Message from Rennie Plowman sent at 12/30/2022 12:13 PM EST ----- Call pt Please ask how he is feeling and if he is still having right ear pain.  Did he start Augmentin?  Does he have any right ear swelling or parotid gland swelling?

## 2023-01-01 NOTE — Telephone Encounter (Signed)
Spoke to pt he stated that he did not pick up Augmentin from the pharmacy  because he did not feel he needed it and that his ear  was feeling  a lot better

## 2023-01-07 ENCOUNTER — Other Ambulatory Visit (INDEPENDENT_AMBULATORY_CARE_PROVIDER_SITE_OTHER): Payer: 59

## 2023-01-07 DIAGNOSIS — I700 Atherosclerosis of aorta: Secondary | ICD-10-CM | POA: Diagnosis not present

## 2023-01-07 LAB — COMPREHENSIVE METABOLIC PANEL
ALT: 18 U/L (ref 0–53)
AST: 15 U/L (ref 0–37)
Albumin: 4.3 g/dL (ref 3.5–5.2)
Alkaline Phosphatase: 107 U/L (ref 39–117)
BUN: 11 mg/dL (ref 6–23)
CO2: 26 meq/L (ref 19–32)
Calcium: 9 mg/dL (ref 8.4–10.5)
Chloride: 107 meq/L (ref 96–112)
Creatinine, Ser: 0.71 mg/dL (ref 0.40–1.50)
GFR: 103.96 mL/min (ref >60.00)
Glucose, Bld: 92 mg/dL (ref 70–99)
Potassium: 4.3 meq/L (ref 3.5–5.1)
Sodium: 141 meq/L (ref 135–145)
Total Bilirubin: 0.5 mg/dL (ref 0.2–1.2)
Total Protein: 6.9 g/dL (ref 6.0–8.3)

## 2023-01-07 LAB — LIPID PANEL
Cholesterol: 183 mg/dL (ref 0–200)
HDL: 31.3 mg/dL — ABNORMAL LOW (ref >39.00)
LDL Cholesterol: 129 mg/dL — ABNORMAL HIGH (ref 0–99)
NonHDL: 151.66
Total CHOL/HDL Ratio: 6
Triglycerides: 115 mg/dL (ref 0.0–149.0)
VLDL: 23 mg/dL (ref 0.0–40.0)

## 2023-01-12 ENCOUNTER — Other Ambulatory Visit: Payer: Self-pay | Admitting: Urology

## 2023-01-12 DIAGNOSIS — N529 Male erectile dysfunction, unspecified: Secondary | ICD-10-CM

## 2023-01-12 DIAGNOSIS — N138 Other obstructive and reflux uropathy: Secondary | ICD-10-CM

## 2023-01-12 MED ORDER — TADALAFIL 5 MG PO TABS
5.0000 mg | ORAL_TABLET | Freq: Every day | ORAL | 3 refills | Status: DC | PRN
Start: 2023-01-12 — End: 2023-09-16

## 2023-01-12 MED ORDER — TADALAFIL 20 MG PO TABS: ORAL_TABLET | ORAL | 3 refills | Status: DC

## 2023-03-01 ENCOUNTER — Encounter: Payer: Self-pay | Admitting: Family

## 2023-03-01 ENCOUNTER — Other Ambulatory Visit: Payer: Self-pay | Admitting: Family

## 2023-03-01 DIAGNOSIS — E1140 Type 2 diabetes mellitus with diabetic neuropathy, unspecified: Secondary | ICD-10-CM

## 2023-03-02 ENCOUNTER — Other Ambulatory Visit: Payer: Self-pay

## 2023-03-02 DIAGNOSIS — E1140 Type 2 diabetes mellitus with diabetic neuropathy, unspecified: Secondary | ICD-10-CM

## 2023-03-02 MED ORDER — METFORMIN HCL ER 500 MG PO TB24: ORAL_TABLET | ORAL | 3 refills | Status: AC

## 2023-03-25 ENCOUNTER — Other Ambulatory Visit: Payer: Self-pay | Admitting: Family

## 2023-03-25 DIAGNOSIS — E1140 Type 2 diabetes mellitus with diabetic neuropathy, unspecified: Secondary | ICD-10-CM

## 2023-03-25 MED ORDER — SEMAGLUTIDE(0.25 OR 0.5MG/DOS) 2 MG/3ML ~~LOC~~ SOPN: 0.5000 mg | PEN_INJECTOR | SUBCUTANEOUS | 2 refills | Status: DC

## 2023-03-25 NOTE — Telephone Encounter (Signed)
 Copied from CRM [id number]. Topic: Clinical - Medication Refill >> Mar 25, 2023  9:35 AM Kathryne Eriksson wrote: Most Recent Primary Care Visit:  Provider: [facility]  Department: [facility]  Visit Type: LAB  Date: 01/07/2023  Medication: Semaglutide,0.25 or 0.5MG /DOS, 2 MG/3ML SOPN  Has the patient contacted their pharmacy? Yes (Agent: If no, request that the patient contact the pharmacy for the refill. If patient does not wish to contact the pharmacy document the reason why and proceed with request.) (Agent: If yes, when and what did the pharmacy advise?)  Is this the correct pharmacy for this prescription? Yes If no, delete pharmacy and type the correct one.  This is the patient's preferred pharmacy:  [facility] [address], [place] - [address] [place] [address] Phone: [phone] Fax: [phone]   Has the prescription been filled recently? No  Is the patient out of the medication? Yes  Has the patient been seen for an appointment in the last year OR does the patient have an upcoming appointment? Yes  Can we respond through MyChart? Yes  Agent: Please be advised that Rx refills may take up to 3 business days. We ask that you follow-up with your pharmacy.

## 2023-03-26 ENCOUNTER — Ambulatory Visit: Payer: 59 | Admitting: Family

## 2023-05-16 ENCOUNTER — Encounter: Payer: Self-pay | Admitting: Family

## 2023-05-18 ENCOUNTER — Other Ambulatory Visit: Payer: Self-pay

## 2023-05-18 DIAGNOSIS — M546 Pain in thoracic spine: Secondary | ICD-10-CM

## 2023-05-18 DIAGNOSIS — N529 Male erectile dysfunction, unspecified: Secondary | ICD-10-CM

## 2023-05-18 MED ORDER — TADALAFIL 20 MG PO TABS: ORAL_TABLET | ORAL | 3 refills | Status: DC

## 2023-05-18 MED ORDER — MELOXICAM 15 MG PO TABS: 15.0000 mg | ORAL_TABLET | Freq: Every day | ORAL | 1 refills | Status: DC | PRN

## 2023-05-18 MED ORDER — CYCLOBENZAPRINE HCL 5 MG PO TABS: 5.0000 mg | ORAL_TABLET | Freq: Every evening | ORAL | 1 refills | Status: DC | PRN

## 2023-05-18 NOTE — Telephone Encounter (Signed)
 Spoke to pt went over notes in detail with pt he verbalized understanding pot will get otc Tylenol  Arthritis 650 mg  informed pt that his  refills have been sent in to pharmacy

## 2023-05-26 ENCOUNTER — Ambulatory Visit: Admitting: Family

## 2023-05-26 ENCOUNTER — Encounter: Payer: Self-pay | Admitting: Family

## 2023-05-26 VITALS — BP 130/70 | HR 81 | Temp 97.7°F | Ht 72.0 in | Wt 200.2 lb

## 2023-05-26 DIAGNOSIS — R7309 Other abnormal glucose: Secondary | ICD-10-CM

## 2023-05-26 DIAGNOSIS — I10 Essential (primary) hypertension: Secondary | ICD-10-CM | POA: Diagnosis not present

## 2023-05-26 DIAGNOSIS — M546 Pain in thoracic spine: Secondary | ICD-10-CM | POA: Diagnosis not present

## 2023-05-26 DIAGNOSIS — E538 Deficiency of other specified B group vitamins: Secondary | ICD-10-CM

## 2023-05-26 DIAGNOSIS — R899 Unspecified abnormal finding in specimens from other organs, systems and tissues: Secondary | ICD-10-CM

## 2023-05-26 DIAGNOSIS — R200 Anesthesia of skin: Secondary | ICD-10-CM

## 2023-05-26 DIAGNOSIS — E785 Hyperlipidemia, unspecified: Secondary | ICD-10-CM

## 2023-05-26 DIAGNOSIS — Z794 Long term (current) use of insulin: Secondary | ICD-10-CM

## 2023-05-26 DIAGNOSIS — E1140 Type 2 diabetes mellitus with diabetic neuropathy, unspecified: Secondary | ICD-10-CM | POA: Diagnosis not present

## 2023-05-26 LAB — MICROALBUMIN / CREATININE URINE RATIO
Creatinine,U: 33.7 mg/dL
Microalb Creat Ratio: UNDETERMINED mg/g (ref 0.0–30.0)
Microalb, Ur: <0.7 mg/dL

## 2023-05-26 LAB — CBC WITH DIFFERENTIAL/PLATELET
Basophils Absolute: 0.1 10*3/uL (ref 0.0–0.1)
Basophils Relative: 0.5 % (ref 0.0–3.0)
Eosinophils Absolute: 0.1 10*3/uL (ref 0.0–0.7)
Eosinophils Relative: 1 % (ref 0.0–5.0)
HCT: 45.6 % (ref 39.0–52.0)
Hemoglobin: 14.9 g/dL (ref 13.0–17.0)
Lymphocytes Relative: 11.1 % — ABNORMAL LOW (ref 12.0–46.0)
Lymphs Abs: 1.2 10*3/uL (ref 0.7–4.0)
MCHC: 32.8 g/dL (ref 30.0–36.0)
MCV: 89.7 fl (ref 78.0–100.0)
Monocytes Absolute: 0.6 10*3/uL (ref 0.1–1.0)
Monocytes Relative: 5.7 % (ref 3.0–12.0)
Neutro Abs: 9.1 10*3/uL — ABNORMAL HIGH (ref 1.4–7.7)
Neutrophils Relative %: 81.7 % — ABNORMAL HIGH (ref 43.0–77.0)
Platelets: 217 10*3/uL (ref 150.0–400.0)
RBC: 5.08 Mil/uL (ref 4.22–5.81)
RDW: 15.6 % — ABNORMAL HIGH (ref 11.5–15.5)
WBC: 11.2 10*3/uL — ABNORMAL HIGH (ref 4.0–10.5)

## 2023-05-26 LAB — COMPREHENSIVE METABOLIC PANEL WITH GFR
ALT: 17 U/L (ref 0–53)
AST: 14 U/L (ref 0–37)
Albumin: 4.4 g/dL (ref 3.5–5.2)
Alkaline Phosphatase: 91 U/L (ref 39–117)
BUN: 14 mg/dL (ref 6–23)
CO2: 27 meq/L (ref 19–32)
Calcium: 9.1 mg/dL (ref 8.4–10.5)
Chloride: 104 meq/L (ref 96–112)
Creatinine, Ser: 0.79 mg/dL (ref 0.40–1.50)
GFR: 100.39 mL/min (ref >60.00)
Glucose, Bld: 95 mg/dL (ref 70–99)
Potassium: 3.9 meq/L (ref 3.5–5.1)
Sodium: 139 meq/L (ref 135–145)
Total Bilirubin: 0.6 mg/dL (ref 0.2–1.2)
Total Protein: 7.2 g/dL (ref 6.0–8.3)

## 2023-05-26 LAB — POCT GLYCOSYLATED HEMOGLOBIN (HGB A1C): Hemoglobin A1C: 6.9 % — AB (ref 4.0–5.6)

## 2023-05-26 LAB — B12 AND FOLATE PANEL
Folate: 10.7 ng/mL (ref >5.9)
Vitamin B-12: 1033 pg/mL — ABNORMAL HIGH (ref 211–911)

## 2023-05-26 LAB — TSH: TSH: 0.93 u[IU]/mL (ref 0.35–5.50)

## 2023-05-26 MED ORDER — EMPAGLIFLOZIN 25 MG PO TABS: 25.0000 mg | ORAL_TABLET | Freq: Every day | ORAL | 3 refills | Status: AC

## 2023-05-26 MED ORDER — CYCLOBENZAPRINE HCL 5 MG PO TABS: 10.0000 mg | ORAL_TABLET | Freq: Every evening | ORAL | Status: DC | PRN

## 2023-05-26 MED ORDER — AMLODIPINE BESYLATE 10 MG PO TABS: 10.0000 mg | ORAL_TABLET | Freq: Every day | ORAL | 3 refills | Status: AC

## 2023-05-26 MED ORDER — EZETIMIBE 10 MG PO TABS: 10.0000 mg | ORAL_TABLET | Freq: Every day | ORAL | 3 refills | Status: AC

## 2023-05-26 MED ORDER — ATORVASTATIN CALCIUM 80 MG PO TABS: 80.0000 mg | ORAL_TABLET | Freq: Every day | ORAL | 1 refills | Status: DC

## 2023-05-26 NOTE — Patient Instructions (Addendum)
 It is imperative to resume Lipitor and Zetia  to lower cardiovascular risk.   Nice to see you today

## 2023-05-26 NOTE — Progress Notes (Signed)
 Assessment & Plan:  Type 2 diabetes mellitus with diabetic neuropathy, without long-term current use of insulin  (HCC)  Elevated glucose -     Microalbumin / creatinine urine ratio -     POCT glycosylated hemoglobin (Hb A1C)  Acute right-sided thoracic back pain -     Cyclobenzaprine  HCl; Take 2 tablets (10 mg total) by mouth at bedtime as needed for muscle spasms.  Hyperlipidemia, unspecified hyperlipidemia type Assessment & Plan: He had stopped Lipitor 80 mg, Zetia  10 mg in the last few months.  Reiterated the importance of resuming medications to lower overall cardiovascular risk.  He is in agreement and will restart.  Plan to obtain lipid panel at follow-up  Orders: -     Ezetimibe ; Take 1 tablet (10 mg total) by mouth daily.  Dispense: 90 tablet; Refill: 3 -     Atorvastatin  Calcium ; Take 1 tablet (80 mg total) by mouth daily at 6 PM.  Dispense: 90 tablet; Refill: 1  Essential hypertension -     amLODIPine  Besylate; Take 1 tablet (10 mg total) by mouth daily.  Dispense: 90 tablet; Refill: 3 -     Comprehensive metabolic panel with GFR -     TSH  B12 deficiency -     B12 and Folate Panel -     CBC with Differential/Platelet  Type 2 diabetes mellitus with diabetic neuropathy, with long-term current use of insulin  [facility]) Assessment & Plan: Lab Results  Component Value Date   HGBA1C 6.9 (A) 05/26/2023  Chronic, stable.  Continue Ozempic  0.5 mg,jardiance  25 mg daily, metformin  2000 mg QD.     Numbness of right foot Assessment & Plan: Suspected diabetic neuropathy right foot. He also has foot cramping after long days of walking at work.  Foot exam performed today.  Continue Flexeril  5 mg as needed.  Will follow   Other orders -     Empagliflozin ; Take 1 tablet (25 mg total) by mouth daily before breakfast.  Dispense: 90 tablet; Refill: 3     Return precautions given.   Risks, benefits, and alternatives of the medications and treatment plan prescribed today were  discussed, and patient expressed understanding.   Education regarding symptom management and diagnosis given to patient on AVS either electronically or printed.  Return in about 3 months (around 08/25/2023).  Bascom Bossier, FNP  Subjective:    Patient ID: Ronald Pruitt., male    DOB: 1970-04-21, 52 y.o.   MRN: [id number]  CC: Ronald Pruitt. is a 53 y.o. male who presents today for follow up.   HPI: Compliant with Ozempic  0.5 mg,jardiance  25 mg daily, metformin  2000 mg every day Denies constipation.   He endorses appetite suppression on ozempic    No low back pain Continues to have right foot numbness and cramping at night , happen only after days at work.  He takes Flexeril  5 mg with relief.  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   Denies leg swelling, rash.     Compliant with losartan  25 mg daily, amlodipine  10 mg daily.  Allergies: Patient has no known allergies. Current Outpatient Medications on File Prior to Visit  Medication Sig Dispense Refill   Blood Glucose Monitoring Suppl (ONETOUCH VERIO) w/Device KIT 1 Device by Does not apply route daily. 1 kit 0   glucose blood (ONETOUCH VERIO) test strip Used to check blood sugar twice a day. 100 each 12   metFORMIN  (GLUCOPHAGE -XR) 500 MG 24 hr  tablet TAKE 4 TABLETS EVERY EVENING 360 tablet 3   mupirocin  ointment (BACTROBAN ) 2 % Apply 1 Application topically 2 (two) times daily. 22 g 2   omeprazole  (PRILOSEC) 20 MG capsule Take 1 capsule (20 mg total) by mouth daily. 90 capsule 4   OneTouch Delica Lancets 33G MISC Used to check blood sugars twice a day. 100 each 3   Semaglutide ,0.25 or 0.5MG /DOS, 2 MG/3ML SOPN Inject 0.5 mg into the skin once a week. 3 mL 2   tadalafil  (CIALIS ) 20 MG tablet TAKE 1 TABLET BY MOUTH ONCE DAILY AS NEEDED FOR  ERECTILE  DYSFUNCTION  -  EFFECTS  MAY  LAST  UP  TO  36  HRS 30 tablet 3   tadalafil  (CIALIS ) 5 MG tablet Take 1 tablet (5 mg total) by  mouth daily as needed for erectile dysfunction. 90 tablet 3   triamcinolone  ointment (KENALOG ) 0.5 % Apply 1 Application topically 2 (two) times daily. Use for < 1 week. Do not use face, genitals 15 g 2   No current facility-administered medications on file prior to visit.    Review of Systems  Constitutional:  Negative for chills and fever.  Respiratory:  Negative for cough.   Cardiovascular:  Negative for chest pain, palpitations and leg swelling.  Gastrointestinal:  Negative for nausea and vomiting.  Musculoskeletal:  Positive for arthralgias (leg cramping, right).      Objective:    BP 130/70   Pulse 81   Temp 97.7 F (36.5 C) (Oral)   Ht 6' (1.829 m)   Wt 200 lb 3.2 oz (90.8 kg)   SpO2 98%   BMI 27.15 kg/m  BP Readings from Last 3 Encounters:  05/26/23 130/70  12/24/22 120/82  09/02/22 128/74   Wt Readings from Last 3 Encounters:  05/26/23 200 lb 3.2 oz (90.8 kg)  12/24/22 206 lb 12.8 oz (93.8 kg)  09/02/22 209 lb (94.8 kg)    Physical Exam Vitals reviewed.  Constitutional:      Appearance: He is well-developed.  Cardiovascular:     Rate and Rhythm: Regular rhythm.     Heart sounds: Normal heart sounds.  Pulmonary:     Effort: Pulmonary effort is normal. No respiratory distress.     Breath sounds: Normal breath sounds. No wheezing, rhonchi or rales.  Skin:    General: Skin is warm and dry.  Neurological:     Mental Status: He is alert.  Psychiatric:        Speech: Speech normal.        Behavior: Behavior normal.

## 2023-05-28 NOTE — Assessment & Plan Note (Signed)
 Suspected diabetic neuropathy right foot. He also has foot cramping after long days of walking at work.  Foot exam performed today.  Continue Flexeril  5 mg as needed.  Will follow

## 2023-05-28 NOTE — Assessment & Plan Note (Signed)
 Lab Results  Component Value Date   HGBA1C 6.9 (A) 05/26/2023  Chronic, stable.  Continue Ozempic  0.5 mg,jardiance  25 mg daily, metformin  2000 mg QD.

## 2023-05-28 NOTE — Assessment & Plan Note (Signed)
 He had stopped Lipitor 80 mg, Zetia  10 mg in the last few months.  Reiterated the importance of resuming medications to lower overall cardiovascular risk.  He is in agreement and will restart.  Plan to obtain lipid panel at follow-up

## 2023-06-04 ENCOUNTER — Encounter: Payer: Self-pay | Admitting: Family

## 2023-06-05 NOTE — Addendum Note (Signed)
 Addended by: Tahirih Lair on: 06/05/2023 11:16 AM   Modules accepted: Orders

## 2023-06-09 ENCOUNTER — Encounter: Payer: Self-pay | Admitting: Family

## 2023-06-10 ENCOUNTER — Telehealth: Payer: Self-pay

## 2023-06-10 ENCOUNTER — Other Ambulatory Visit (HOSPITAL_COMMUNITY): Payer: Self-pay

## 2023-06-10 NOTE — Telephone Encounter (Signed)
 PA request has been Submitted. New Encounter has been or will be created for follow up. For additional info see Pharmacy Prior Auth telephone encounter from 06/10/2023.

## 2023-06-10 NOTE — Telephone Encounter (Signed)
 Pharmacy Patient Advocate Encounter   Received notification from Patient Advice Request messages that prior authorization for Ozempic  (0.25 or 0.5 MG/DOSE) 2MG /3ML pen-injectors is required/requested.   Insurance verification completed.   The patient is insured through [employer] .   Per test claim: PA required; PA submitted to above mentioned insurance via CoverMyMeds Key/confirmation #/EOC [id number] Status is pending

## 2023-06-10 NOTE — Telephone Encounter (Signed)
 Pt has been notified.

## 2023-06-11 ENCOUNTER — Other Ambulatory Visit (HOSPITAL_COMMUNITY): Payer: Self-pay

## 2023-06-11 NOTE — Telephone Encounter (Signed)
 Pharmacy Patient Advocate Encounter  Received notification from [facility] that Prior Authorization for {Ozempic  (0.25 or 0.5 MG/DOSE) 2MG /3ML pen-injectorshas been APPROVED from 06/10/23 to 06/09/24. Ran test claim, Copay is $24.99. This test claim was processed through [facility]- copay amounts may vary at other pharmacies due to pharmacy/plan contracts, or as the patient moves through the different stages of their insurance plan.   PA #/Case ID/Reference #: [id number] BT

## 2023-06-15 ENCOUNTER — Other Ambulatory Visit: Payer: Self-pay

## 2023-06-15 DIAGNOSIS — E1140 Type 2 diabetes mellitus with diabetic neuropathy, unspecified: Secondary | ICD-10-CM

## 2023-06-15 MED ORDER — SEMAGLUTIDE(0.25 OR 0.5MG/DOS) 2 MG/3ML ~~LOC~~ SOPN: 0.5000 mg | PEN_INJECTOR | SUBCUTANEOUS | 2 refills | Status: DC

## 2023-06-15 NOTE — Telephone Encounter (Signed)
 Spoke to pt and informed him that he was approved for Ozempic  from 06/09/23-06/09/24, rx sent in to pharmacy for pt

## 2023-06-19 ENCOUNTER — Encounter: Payer: Self-pay | Admitting: Family

## 2023-07-15 ENCOUNTER — Encounter: Payer: Self-pay | Admitting: Family

## 2023-08-24 ENCOUNTER — Ambulatory Visit: Admitting: Family

## 2023-08-24 ENCOUNTER — Other Ambulatory Visit: Payer: Self-pay

## 2023-08-24 ENCOUNTER — Other Ambulatory Visit

## 2023-08-24 ENCOUNTER — Encounter: Payer: Self-pay | Admitting: Family

## 2023-08-24 VITALS — BP 124/70 | HR 80 | Temp 97.5°F | Ht 72.0 in | Wt 202.6 lb

## 2023-08-24 DIAGNOSIS — N138 Other obstructive and reflux uropathy: Secondary | ICD-10-CM

## 2023-08-24 DIAGNOSIS — R899 Unspecified abnormal finding in specimens from other organs, systems and tissues: Secondary | ICD-10-CM

## 2023-08-24 DIAGNOSIS — E785 Hyperlipidemia, unspecified: Secondary | ICD-10-CM | POA: Diagnosis not present

## 2023-08-24 DIAGNOSIS — Z1211 Encounter for screening for malignant neoplasm of colon: Secondary | ICD-10-CM

## 2023-08-24 DIAGNOSIS — R7309 Other abnormal glucose: Secondary | ICD-10-CM

## 2023-08-24 DIAGNOSIS — L989 Disorder of the skin and subcutaneous tissue, unspecified: Secondary | ICD-10-CM | POA: Diagnosis not present

## 2023-08-24 DIAGNOSIS — E1140 Type 2 diabetes mellitus with diabetic neuropathy, unspecified: Secondary | ICD-10-CM

## 2023-08-24 DIAGNOSIS — D649 Anemia, unspecified: Secondary | ICD-10-CM | POA: Diagnosis not present

## 2023-08-24 DIAGNOSIS — R21 Rash and other nonspecific skin eruption: Secondary | ICD-10-CM

## 2023-08-24 DIAGNOSIS — Z794 Long term (current) use of insulin: Secondary | ICD-10-CM

## 2023-08-24 LAB — CBC WITH DIFFERENTIAL/PLATELET
Basophils Absolute: 0 K/uL (ref 0.0–0.1)
Basophils Relative: 0.5 % (ref 0.0–3.0)
Eosinophils Absolute: 0.2 K/uL (ref 0.0–0.7)
Eosinophils Relative: 1.7 % (ref 0.0–5.0)
HCT: 46.1 % (ref 39.0–52.0)
Hemoglobin: 15.2 g/dL (ref 13.0–17.0)
Lymphocytes Relative: 16 % (ref 12.0–46.0)
Lymphs Abs: 1.4 K/uL (ref 0.7–4.0)
MCHC: 32.9 g/dL (ref 30.0–36.0)
MCV: 88.9 fl (ref 78.0–100.0)
Monocytes Absolute: 0.6 K/uL (ref 0.1–1.0)
Monocytes Relative: 6.4 % (ref 3.0–12.0)
Neutro Abs: 6.6 K/uL (ref 1.4–7.7)
Neutrophils Relative %: 75.4 % (ref 43.0–77.0)
Platelets: 203 K/uL (ref 150.0–400.0)
RBC: 5.18 Mil/uL (ref 4.22–5.81)
RDW: 15.1 % (ref 11.5–15.5)
WBC: 8.8 K/uL (ref 4.0–10.5)

## 2023-08-24 LAB — LIPID PANEL
Cholesterol: 100 mg/dL (ref 0–200)
HDL: 34 mg/dL — ABNORMAL LOW (ref >39.00)
LDL Cholesterol: 52 mg/dL (ref 0–99)
NonHDL: 66.18
Total CHOL/HDL Ratio: 3
Triglycerides: 71 mg/dL (ref 0.0–149.0)
VLDL: 14.2 mg/dL (ref 0.0–40.0)

## 2023-08-24 LAB — POCT GLYCOSYLATED HEMOGLOBIN (HGB A1C): Hemoglobin A1C: 7.2 % — AB (ref 4.0–5.6)

## 2023-08-24 MED ORDER — ATORVASTATIN CALCIUM 80 MG PO TABS: 80.0000 mg | ORAL_TABLET | Freq: Every day | ORAL | 1 refills | Status: AC

## 2023-08-24 NOTE — Progress Notes (Unsigned)
 Assessment & Plan:  Elevated glucose -     POCT glycosylated hemoglobin (Hb A1C)  Hyperlipidemia, unspecified hyperlipidemia type -     Atorvastatin  Calcium ; Take 1 tablet (80 mg total) by mouth daily at 6 PM.  Dispense: 90 tablet; Refill: 1 -     Lipid panel  Abnormal laboratory test -     CBC with Differential/Platelet  Skin lesion Assessment & Plan: Right forearm skin lesion SCC versus SK. Referral to dermatology.  Orders: -     Ambulatory referral to Dermatology  Type 2 diabetes mellitus with diabetic neuropathy, with long-term current use of insulin  [facility]) Assessment & Plan: Lab Results  Component Value Date   HGBA1C 7.2 (A) 08/24/2023  Chronic, stable with modest increase. Discussed obtaining or greatly reducing daily sweet tea/ soda use. Continue Ozempic  0.5 mg,jardiance  25 mg daily, metformin  2000 mg QD.    Orders: -     Ambulatory referral to Dermatology  Anemia, unspecified type -     IBC + Ferritin -     Ambulatory referral to Gastroenterology -     Urinalysis, Routine w reflex microscopic  Screen for colon cancer -     Ambulatory referral to Gastroenterology  Rash Assessment & Plan: Few grouped petechial lesions lower extremity ( see photo). Improved based on patient's description.  He is taking 1000mg  aspirin BID in Goody's Powder. Advised him to transition to tylenol  arthritis.        Return precautions given.   Risks, benefits, and alternatives of the medications and treatment plan prescribed today were discussed, and patient expressed understanding.   Education regarding symptom management and diagnosis given to patient on AVS either electronically or printed.  Return in about 3 months (around 11/24/2023).  Rollene Northern, FNP  Subjective:    Patient ID: Ronald LELON Jackquline Mickey., male    DOB: November 26, 1970, 53 y.o.   MRN: [id number]  CC: Turner Baillie. is a 53 y.o. male who presents today for follow up.   HPI: He noted rash on BLE ,  improved. Small red dots.  No bleeding. Denies pruritus.   He is taking two BC powders per day for back and neck pain.  He is drinking more water than he ever has Drinks one sweet tea and regular cola per day.     Compliant with Ozempic  0.5 mg,jardiance  25 mg daily, metformin  2000 mg QD.   Leg cramps are significantly better; he is not taking flexeril , mobic .     He was seen 07/27/23 by [facility] orthopedic Dr Rosalva  He continues to have bilateral anterior shoulder and posterior cervical neck pain.  07/2023 MRI cervical Severe right foraminal stenosis C3-C4 and C4-C5. No significant  foraminal stenoses   Allergies: Patient has no known allergies. Current Outpatient Medications on File Prior to Visit  Medication Sig Dispense Refill   amLODipine  (NORVASC ) 10 MG tablet Take 1 tablet (10 mg total) by mouth daily. 90 tablet 3   Blood Glucose Monitoring Suppl (ONETOUCH VERIO) w/Device KIT 1 Device by Does not apply route daily. 1 kit 0   cyclobenzaprine  (FLEXERIL ) 5 MG tablet Take 2 tablets (10 mg total) by mouth at bedtime as needed for muscle spasms.     empagliflozin  (JARDIANCE ) 25 MG TABS tablet Take 1 tablet (25 mg total) by mouth daily before breakfast. 90 tablet 3   ezetimibe  (ZETIA ) 10 MG tablet Take 1 tablet (10 mg total) by mouth daily. 90 tablet 3   glucose blood (ONETOUCH VERIO) test  strip Used to check blood sugar twice a day. 100 each 12   metFORMIN  (GLUCOPHAGE -XR) 500 MG 24 hr tablet TAKE 4 TABLETS EVERY EVENING 360 tablet 3   mupirocin  ointment (BACTROBAN ) 2 % Apply 1 Application topically 2 (two) times daily. 22 g 2   omeprazole  (PRILOSEC) 20 MG capsule Take 1 capsule (20 mg total) by mouth daily. 90 capsule 4   OneTouch Delica Lancets 33G MISC Used to check blood sugars twice a day. 100 each 3   Semaglutide ,0.25 or 0.5MG /DOS, 2 MG/3ML SOPN Inject 0.5 mg into the skin once a week. 3 mL 2   tadalafil  (CIALIS ) 20 MG tablet TAKE 1 TABLET BY MOUTH ONCE DAILY AS NEEDED FOR  ERECTILE   DYSFUNCTION  -  EFFECTS  MAY  LAST  UP  TO  36  HRS 30 tablet 3   tadalafil  (CIALIS ) 5 MG tablet Take 1 tablet (5 mg total) by mouth daily as needed for erectile dysfunction. 90 tablet 3   triamcinolone  ointment (KENALOG ) 0.5 % Apply 1 Application topically 2 (two) times daily. Use for < 1 week. Do not use face, genitals 15 g 2   No current facility-administered medications on file prior to visit.    Review of Systems  Constitutional:  Negative for chills and fever.  Respiratory:  Negative for cough.   Cardiovascular:  Negative for chest pain and palpitations.  Gastrointestinal:  Negative for nausea and vomiting.  Musculoskeletal:  Positive for arthralgias (shoulder pain), back pain and neck pain.  Skin:  Positive for rash.      Objective:    BP 124/70   Pulse 80   Temp (!) 97.5 F (36.4 C) (Oral)   Ht 6' (1.829 m)   Wt 202 lb 9.6 oz (91.9 kg)   SpO2 98%   BMI 27.48 kg/m  BP Readings from Last 3 Encounters:  08/24/23 124/70  05/26/23 130/70  12/24/22 120/82   Wt Readings from Last 3 Encounters:  08/24/23 202 lb 9.6 oz (91.9 kg)  05/26/23 200 lb 3.2 oz (90.8 kg)  12/24/22 206 lb 12.8 oz (93.8 kg)    Physical Exam Vitals reviewed.  Constitutional:      Appearance: He is well-developed.  Cardiovascular:     Rate and Rhythm: Regular rhythm.     Heart sounds: Normal heart sounds.  Pulmonary:     Effort: Pulmonary effort is normal. No respiratory distress.     Breath sounds: Normal breath sounds. No wheezing, rhonchi or rales.  Skin:    General: Skin is warm and dry.  Neurological:     Mental Status: He is alert.  Psychiatric:        Speech: Speech normal.        Behavior: Behavior normal.

## 2023-08-24 NOTE — Patient Instructions (Addendum)
 Limit soda and sweet tea :)   Stop BC powder for neck pain and see if rash persists .  Instead start tylenol  arthritis 650mg  once in the morning and once in the afternoon.   Referral to dermatology and for colonoscopy  Let us  know if you dont hear back within a week in regards to an appointment being scheduled.   So that you are aware, if you are [facility] MyChart user , please pay attention to your MyChart messages as you may receive a MyChart message with a phone number to call and schedule this test/appointment own your own from our referral coordinator. This is a new process so I do not want you to miss this message.  If you are not a MyChart user, you will receive a phone call.

## 2023-08-25 ENCOUNTER — Other Ambulatory Visit: Payer: Self-pay

## 2023-08-25 ENCOUNTER — Encounter: Payer: Self-pay | Admitting: Family

## 2023-08-25 DIAGNOSIS — R21 Rash and other nonspecific skin eruption: Secondary | ICD-10-CM | POA: Insufficient documentation

## 2023-08-25 LAB — URINALYSIS, ROUTINE W REFLEX MICROSCOPIC
Bilirubin Urine: NEGATIVE
Hgb urine dipstick: NEGATIVE
Ketones, ur: NEGATIVE
Leukocytes,Ua: NEGATIVE
Nitrite: NEGATIVE
RBC / HPF: NONE SEEN (ref 0–?)
Specific Gravity, Urine: 1.01 (ref 1.000–1.030)
Total Protein, Urine: NEGATIVE
Urine Glucose: >=1000 — AB
Urobilinogen, UA: 0.2 (ref 0.0–1.0)
pH: 6 (ref 5.0–8.0)

## 2023-08-25 LAB — IBC + FERRITIN
Ferritin: 8.8 ng/mL — ABNORMAL LOW (ref 22.0–322.0)
Iron: 60 ug/dL (ref 42–165)
Saturation Ratios: 13.6 % — ABNORMAL LOW (ref 20.0–50.0)
TIBC: 442.4 ug/dL (ref 250.0–450.0)
Transferrin: 316 mg/dL (ref 212.0–360.0)

## 2023-08-25 LAB — PSA: Prostate Specific Ag, Serum: 0.3 ng/mL (ref 0.0–4.0)

## 2023-08-25 NOTE — Assessment & Plan Note (Addendum)
 Few grouped petechial lesions lower extremity ( see photo). Improved based on patient's description.  He is taking 1000mg  aspirin BID in [employer] Powder. Advised him to transition to tylenol  arthritis.

## 2023-08-25 NOTE — Assessment & Plan Note (Signed)
 Right forearm skin lesion SCC versus SK. Referral to dermatology.

## 2023-08-25 NOTE — Assessment & Plan Note (Signed)
 Lab Results  Component Value Date   HGBA1C 7.2 (A) 08/24/2023  Chronic, stable with modest increase. Discussed obtaining or greatly reducing daily sweet tea/ soda use. Continue Ozempic  0.5 mg,jardiance  25 mg daily, metformin  2000 mg QD.

## 2023-08-26 ENCOUNTER — Ambulatory Visit: Payer: Self-pay | Admitting: Family

## 2023-09-01 ENCOUNTER — Ambulatory Visit: Payer: Self-pay | Admitting: Urology

## 2023-09-06 ENCOUNTER — Other Ambulatory Visit: Payer: Self-pay | Admitting: Family

## 2023-09-06 DIAGNOSIS — E1140 Type 2 diabetes mellitus with diabetic neuropathy, unspecified: Secondary | ICD-10-CM

## 2023-09-10 ENCOUNTER — Ambulatory Visit: Admitting: Dermatology

## 2023-09-15 NOTE — Progress Notes (Unsigned)
 09/16/2023 4:27 PM   Ronald Pruitt. 1971-01-01 [id number]  Referring provider: Dineen Rollene MATSU, FNP [address] [place],  [place] [address]  Urological history: 1.  Hypogonadism - contributing factors of age, diabetes and obesity - Testosterone  level normalized without treatment    ED  - contributing factors of age, BPH, testosterone  deficiency, DM, HTN, HLD, sleep apnea, and stress - tadalafil  20 mg, on-demand dosing   3. BPH with LUTS  - PSA (07/2023) 0.3 - prostate volume ~ 40 cc - tadalafil  5 mg daily   4. Nephrolithiasis  - stone composition 80% calcium  oxalate monohydrate and 20% calcium  oxalate dihydrate - s/p ESWL left renal stone 2021 - s/p ESWL left renal stone 09/20/2020 - CT renal stone study (03/2022) 12 mm left renal calculus  No chief complaint on file.  HPI: Ronald Pruitt. is a 53 y.o. man who presents today for yearly visit.    Previous records reviewed.   PSA (07/2023) 0.3  UA (07/2023) negative for micro heme  Total cholesterol (07/2023) 100  Hbg A1c (07/2023) 7.2  TSH (04/2023) 0.93  Serum creatinine (04/2023) 0.79, eGFR 100.39  PMH: Past Medical History:  Diagnosis Date   Arthritis    BPH (benign prostatic hyperplasia)    Diabetes (HCC)    Dysuria    GERD (gastroesophageal reflux disease)    History of kidney stones    HTN (hypertension)    Hyperlipidemia    Impotence    Nocturia    Obesity    Pneumonia age 80's   Sleep apnea    CPAP - severe (sleep study 01/11/16)   Testicular hypofunction    Wears dentures    full upper and lower    Surgical History: Past Surgical History:  Procedure Laterality Date   CHOLECYSTECTOMY     COLONOSCOPY WITH PROPOFOL  N/A 10/12/2018   Procedure: COLONOSCOPY WITH PROPOFOL ;  Surgeon: Jinny Carmine, MD;  Location: [facility];  Service: Endoscopy;  Laterality: N/A;   EXTRACORPOREAL SHOCK WAVE LITHOTRIPSY Left 09/29/2019   Procedure: EXTRACORPOREAL SHOCK WAVE  LITHOTRIPSY (ESWL);  Surgeon: Penne Knee, MD;  Location: [facility] OR[facility];  Service: Urology;  Laterality: Left;   EXTRACORPOREAL SHOCK WAVE LITHOTRIPSY Left 09/20/2020   Procedure: EXTRACORPOREAL SHOCK WAVE LITHOTRIPSY (ESWL);  Surgeon: Penne Knee, MD;  Location: [facility] OR[facility];  Service: Urology;  Laterality: Left;   NASAL SEPTOPLASTY W/ TURBINOPLASTY N/A 05/20/2017   Procedure: NASAL SEPTOPLASTY WITH SUBMUCOUS RESECTION;  Surgeon: Blair Mt, MD;  Location: [facility] OR[facility];  Service: ENT;  Laterality: N/A;   VASECTOMY      Home Medications:  Allergies as of 09/16/2023   No Known Allergies      Medication List        Accurate as of September 15, 2023  4:27 PM. If you have any questions, ask your nurse or doctor.          amLODipine  10 MG tablet Commonly known as: NORVASC  Take 1 tablet (10 mg total) by mouth daily.   atorvastatin  80 MG tablet Commonly known as: LIPITOR Take 1 tablet (80 mg total) by mouth daily at 6 PM.   cyclobenzaprine  5 MG tablet Commonly known as: FLEXERIL  Take 2 tablets (10 mg total) by mouth at bedtime as needed for muscle spasms.   empagliflozin  25 MG Tabs tablet Commonly known as: Jardiance  Take 1 tablet (25 mg total) by mouth daily before breakfast.   ezetimibe  10 MG tablet Commonly known as: ZETIA  Take 1 tablet (10 mg  total) by mouth daily.   metFORMIN  500 MG 24 hr tablet Commonly known as: GLUCOPHAGE -XR TAKE 4 TABLETS EVERY EVENING   mupirocin  ointment 2 % Commonly known as: BACTROBAN  Apply 1 Application topically 2 (two) times daily.   omeprazole  20 MG capsule Commonly known as: PRILOSEC Take 1 capsule (20 mg total) by mouth daily.   OneTouch Delica Lancets 33G Misc Used to check blood sugars twice a day.   OneTouch Verio test strip Generic drug: glucose blood Used to check blood sugar twice a day.   OneTouch Verio w/Device Kit 1 Device by Does not apply route daily.   Ozempic  (0.25 or 0.5 MG/DOSE) 2 MG/3ML Sopn Generic drug:  Semaglutide (0.25 or 0.5MG /DOS) INJECT 0.5MG  INTO THE SKIN ONCE WEEKLY   tadalafil  5 MG tablet Commonly known as: CIALIS  Take 1 tablet (5 mg total) by mouth daily as needed for erectile dysfunction.   tadalafil  20 MG tablet Commonly known as: CIALIS  TAKE 1 TABLET BY MOUTH ONCE DAILY AS NEEDED FOR  ERECTILE  DYSFUNCTION  -  EFFECTS  MAY  LAST  UP  TO  36  HRS   triamcinolone  ointment 0.5 % Commonly known as: KENALOG  Apply 1 Application topically 2 (two) times daily. Use for < 1 week. Do not use face, genitals        Allergies: No Known Allergies  Family History: Family History  Problem Relation Age of Onset   Hyperlipidemia Mother    Diabetes Mellitus II Mother    Seizures Mother    Diabetes Mother    Mental illness Mother    Arthritis Mother    Heart attack Mother    COPD Mother    Diabetes Father    Cirrhosis Father        non alcoholic   Hyperlipidemia Father    Arthritis Father    Liver cancer Father    Diabetes Maternal Grandmother    Hyperlipidemia Maternal Grandmother    Arthritis Maternal Grandmother    Diabetes Maternal Grandfather    Hyperlipidemia Maternal Grandfather    Arthritis Maternal Grandfather    Diabetes Paternal Grandmother    Hyperlipidemia Paternal Grandmother    Arthritis Paternal Grandmother    Diabetes Paternal Grandfather    Hyperlipidemia Paternal Grandfather    Arthritis Paternal Grandfather    Kidney disease Neg Hx    Prostate cancer Neg Hx    Kidney cancer Neg Hx    Bladder Cancer Neg Hx    Colon cancer Neg Hx    Thyroid  cancer Neg Hx     Social History:  reports that he has been smoking cigarettes. He has a 10 pack-year smoking history. He has never used smokeless tobacco. He reports that he does not drink alcohol and does not use drugs.  ROS: Pertinent ROS in HPI  Physical Exam: There were no vitals taken for this visit.  Constitutional:  Well nourished. Alert and oriented, No acute distress. HEENT: [place] AT, moist mucus  membranes.  Trachea midline, no masses. Cardiovascular: No clubbing, cyanosis, or edema. Respiratory: Normal respiratory effort, no increased work of breathing. GI: Abdomen is soft, non tender, non distended, no abdominal masses. Liver and spleen not palpable.  No hernias appreciated.  Stool sample for occult testing is not indicated.   GU: No CVA tenderness.  No bladder fullness or masses.  Patient with circumcised/uncircumcised phallus. ***Foreskin easily retracted***  Urethral meatus is patent.  No penile discharge. No penile lesions or rashes. Scrotum without lesions, cysts, rashes and/or edema.  Testicles  are located scrotally bilaterally. No masses are appreciated in the testicles. Left and right epididymis are normal. Rectal: Patient with  normal sphincter tone. Anus and perineum without scarring or rashes. No rectal masses are appreciated. Prostate is approximately *** grams, *** nodules are appreciated. Seminal vesicles are normal. Skin: No rashes, bruises or suspicious lesions. Lymph: No cervical or inguinal adenopathy. Neurologic: Grossly intact, no focal deficits, moving all 4 extremities. Psychiatric: Normal mood and affect.   Laboratory Data: See EPIC and HPI I have reviewed the labs.  See HPI.      Pertinent Imaging: N/A  Assessment & Plan:    1. BPH with LUTS -PSA stable  -Continue Cialis  5 mg daily   2. Erectile dysfunction -continue tadalafil  20 mg, on-demand-dosing  3. Renal stone -stable left renal stone  -explained that he had a CT in March of this year and no right sided stones were seen and the left renal stone would not be causing his right-sided back pain    No follow-ups on file.  These notes generated with voice recognition software. I apologize for typographical errors.  CLOTILDA HELON RIGGERS  [facility] [address]  [address] [place], [place] [address] [phone]

## 2023-09-16 ENCOUNTER — Encounter: Payer: Self-pay | Admitting: Urology

## 2023-09-16 ENCOUNTER — Ambulatory Visit: Admitting: Urology

## 2023-09-16 VITALS — BP 128/81 | HR 94 | Ht 72.0 in | Wt 200.0 lb

## 2023-09-16 DIAGNOSIS — N529 Male erectile dysfunction, unspecified: Secondary | ICD-10-CM | POA: Diagnosis not present

## 2023-09-16 DIAGNOSIS — N200 Calculus of kidney: Secondary | ICD-10-CM | POA: Diagnosis not present

## 2023-09-16 DIAGNOSIS — N401 Enlarged prostate with lower urinary tract symptoms: Secondary | ICD-10-CM | POA: Diagnosis not present

## 2023-09-16 DIAGNOSIS — N138 Other obstructive and reflux uropathy: Secondary | ICD-10-CM

## 2023-09-16 MED ORDER — TADALAFIL 20 MG PO TABS: ORAL_TABLET | ORAL | 3 refills | Status: AC

## 2023-09-16 MED ORDER — TADALAFIL 5 MG PO TABS
5.0000 mg | ORAL_TABLET | Freq: Every day | ORAL | 3 refills | Status: AC | PRN
Start: 2023-09-16 — End: ?

## 2023-10-05 ENCOUNTER — Encounter: Payer: Self-pay | Admitting: Family

## 2023-10-05 DIAGNOSIS — E1140 Type 2 diabetes mellitus with diabetic neuropathy, unspecified: Secondary | ICD-10-CM

## 2023-10-12 MED ORDER — SEMAGLUTIDE (1 MG/DOSE) 4 MG/3ML ~~LOC~~ SOPN: 1.0000 mg | PEN_INJECTOR | SUBCUTANEOUS | 1 refills | Status: DC

## 2023-10-13 MED ORDER — SEMAGLUTIDE (1 MG/DOSE) 4 MG/3ML ~~LOC~~ SOPN: 1.0000 mg | PEN_INJECTOR | SUBCUTANEOUS | 2 refills | Status: DC

## 2023-11-06 ENCOUNTER — Encounter: Payer: Self-pay | Admitting: Family

## 2023-11-06 DIAGNOSIS — K219 Gastro-esophageal reflux disease without esophagitis: Secondary | ICD-10-CM

## 2023-11-09 MED ORDER — OMEPRAZOLE 20 MG PO CPDR
20.0000 mg | DELAYED_RELEASE_CAPSULE | Freq: Every day | ORAL | 1 refills | Status: AC
Start: 2023-11-09 — End: ?

## 2023-11-24 ENCOUNTER — Encounter: Payer: Self-pay | Admitting: Family

## 2023-11-24 ENCOUNTER — Ambulatory Visit: Admitting: Family

## 2023-11-24 VITALS — BP 130/80 | HR 85 | Temp 97.9°F | Ht 73.0 in | Wt 199.2 lb

## 2023-11-24 DIAGNOSIS — M549 Dorsalgia, unspecified: Secondary | ICD-10-CM

## 2023-11-24 DIAGNOSIS — G8929 Other chronic pain: Secondary | ICD-10-CM

## 2023-11-24 DIAGNOSIS — Z1211 Encounter for screening for malignant neoplasm of colon: Secondary | ICD-10-CM

## 2023-11-24 DIAGNOSIS — E1140 Type 2 diabetes mellitus with diabetic neuropathy, unspecified: Secondary | ICD-10-CM | POA: Diagnosis not present

## 2023-11-24 DIAGNOSIS — Z794 Long term (current) use of insulin: Secondary | ICD-10-CM | POA: Diagnosis not present

## 2023-11-24 DIAGNOSIS — R7309 Other abnormal glucose: Secondary | ICD-10-CM

## 2023-11-24 LAB — POCT GLYCOSYLATED HEMOGLOBIN (HGB A1C): HbA1c POC (<> result, manual entry): 8.1 % (ref 4.0–5.6)

## 2023-11-24 MED ORDER — SEMAGLUTIDE (2 MG/DOSE) 8 MG/3ML ~~LOC~~ SOPN: 2.0000 mg | PEN_INJECTOR | SUBCUTANEOUS | 2 refills | Status: DC

## 2023-11-24 MED ORDER — GLUCOSE BLOOD VI STRP: ORAL_STRIP | 12 refills | Status: AC

## 2023-11-24 MED ORDER — GABAPENTIN 100 MG PO CAPS: 100.0000 mg | ORAL_CAPSULE | Freq: Three times a day (TID) | ORAL | 3 refills | Status: DC

## 2023-11-24 NOTE — Progress Notes (Signed)
 Assessment & Plan:  Type 2 diabetes mellitus with diabetic neuropathy, without long-term current use of insulin  (HCC) -     Semaglutide  (2 MG/DOSE); Inject 2 mg as directed once a week.  Dispense: 3 mL; Refill: 2  Elevated glucose -     POCT glycosylated hemoglobin (Hb A1C)  Screening for colon cancer -     Ambulatory referral to Gastroenterology  Type 2 diabetes mellitus with diabetic neuropathy, with long-term current use of insulin  (HCC) Assessment & Plan: Uncontrolled. Recent prednisone  taper , ESI with [facility].  He has tolerated ozempic  1mg ; increase ozempic  to 2mg .  Continue Jardiance  25 mg daily, metformin  2000 mg QD.       Chronic right-sided back pain, unspecified back location Assessment & Plan: S/p ESI. Following with [facility]. We agreed re trial of gabapentin  100mg  tid , titrate.  He will continue following with orthopedics.    Other orders -     Gabapentin ; Take 1 capsule (100 mg total) by mouth 3 (three) times daily.  Dispense: 90 capsule; Refill: 3     Return precautions given.   Risks, benefits, and alternatives of the medications and treatment plan prescribed today were discussed, and patient expressed understanding.   Education regarding symptom management and diagnosis given to patient on AVS either electronically or printed.  Return in about 3 months (around 02/24/2024).  Ronald Northern, FNP  Subjective:    Patient ID: Ronald Pruitt., male    DOB: 02-13-1970, 54 y.o.   MRN: [id number]  CC: Ronald Pruitt. is a 52 y.o. male who presents today for follow up.   HPI: HPI Discussed the use of AI scribe software for clinical note transcription with the patient, who gave verbal consent to proceed.  History of Present Illness   Ronald Pruitt. is a 53 year old male with diabetes and cervical stenosis who presents for follow-up on blood sugar management and neck pain.  He has ongoing neck and shoulder pain, primarily  on the right side, following a spinal injection at [facility] on November 09, 2023. The injection was for numbness and pain radiating down his arms. He notes some improvement but still experiences significant pain with certain movements, such as reaching up or bending his neck. He previously tried oral prednisone  for pain relief, which temporarily helped but significantly increased his blood sugar levels.  He has a history of diabetes and is currently managing his blood sugar with Ozempic  1 mg weekly, Jardiance  25 mg daily, and Metformin  2000 mg daily. He experienced a significant increase in blood sugar levels after taking prednisone , with his A1c rising to 8.1. He managed to lower his blood sugar to 108 mg/dL before his spinal procedure by increasing water intake.   He reports symptoms of neuropathy in his feet, describing them as feeling numb and annoying. He has tried gabapentin  in the past but did not find it effective.   He is currently taking amlodipine  10 mg daily for blood pressure management, with home readings typically in the 120s to 130s over 80s. He has an upcoming eye exam scheduled for next month. He has not yet scheduled the colonoscopy but prefers to have it done closer to home in [place].       Allergies: Patient has no known allergies. Current Outpatient Medications on File Prior to Visit  Medication Sig Dispense Refill   amLODipine  (NORVASC ) 10 MG tablet Take 1 tablet (10 mg total) by mouth daily. 90 tablet  3   atorvastatin  (LIPITOR) 80 MG tablet Take 1 tablet (80 mg total) by mouth daily at 6 PM. 90 tablet 1   Blood Glucose Monitoring Suppl (ONETOUCH VERIO) w/Device KIT 1 Device by Does not apply route daily. 1 kit 0   empagliflozin  (JARDIANCE ) 25 MG TABS tablet Take 1 tablet (25 mg total) by mouth daily before breakfast. 90 tablet 3   ezetimibe  (ZETIA ) 10 MG tablet Take 1 tablet (10 mg total) by mouth daily. 90 tablet 3   glucose blood (ONETOUCH VERIO) test strip Used to  check blood sugar twice a day. 100 each 12   metFORMIN  (GLUCOPHAGE -XR) 500 MG 24 hr tablet TAKE 4 TABLETS EVERY EVENING 360 tablet 3   omeprazole  (PRILOSEC) 20 MG capsule Take 1 capsule (20 mg total) by mouth daily. 90 capsule 1   OneTouch Delica Lancets 33G MISC Used to check blood sugars twice a day. 100 each 3   tadalafil  (CIALIS ) 20 MG tablet TAKE 1 TABLET BY MOUTH ONCE DAILY AS NEEDED FOR  ERECTILE  DYSFUNCTION  -  EFFECTS  MAY  LAST  UP  TO  36  HRS 30 tablet 3   tadalafil  (CIALIS ) 5 MG tablet Take 1 tablet (5 mg total) by mouth daily as needed for erectile dysfunction. 90 tablet 3   No current facility-administered medications on file prior to visit.    Review of Systems  Constitutional:  Negative for chills and fever.  Respiratory:  Negative for cough.   Cardiovascular:  Negative for chest pain and palpitations.  Gastrointestinal:  Negative for nausea and vomiting.  Musculoskeletal:  Positive for neck pain.  Neurological:  Positive for numbness (neck , upper arms).      Objective:    BP 130/80 Comment: at home  Pulse 85   Temp 97.9 F (36.6 C) (Oral)   Ht 6' 1 (1.854 m)   Wt 199 lb 3.2 oz (90.4 kg)   SpO2 97%   BMI 26.28 kg/m  BP Readings from Last 3 Encounters:  11/24/23 130/80  09/16/23 128/81  08/24/23 124/70   Wt Readings from Last 3 Encounters:  11/24/23 199 lb 3.2 oz (90.4 kg)  09/16/23 200 lb (90.7 kg)  08/24/23 202 lb 9.6 oz (91.9 kg)    Physical Exam Vitals reviewed.  Constitutional:      Appearance: He is well-developed.  Cardiovascular:     Rate and Rhythm: Regular rhythm.     Heart sounds: Normal heart sounds.  Pulmonary:     Effort: Pulmonary effort is normal. No respiratory distress.     Breath sounds: Normal breath sounds. No wheezing, rhonchi or rales.  Skin:    General: Skin is warm and dry.  Neurological:     Mental Status: He is alert.  Psychiatric:        Speech: Speech normal.        Behavior: Behavior normal.

## 2023-11-24 NOTE — Assessment & Plan Note (Addendum)
 Uncontrolled. Recent prednisone  taper , ESI with [facility].  He has tolerated ozempic  1mg ; increase ozempic  to 2mg .  Continue Jardiance  25 mg daily, metformin  2000 mg QD.

## 2023-11-24 NOTE — Assessment & Plan Note (Signed)
 S/p ESI. Following with [facility]. We agreed re trial of gabapentin  100mg  tid , titrate.  He will continue following with orthopedics.

## 2023-11-24 NOTE — Addendum Note (Signed)
 Addended by: MORGAN LEVORA RAMAN on: 11/24/2023 12:02 PM   Modules accepted: Orders

## 2023-11-24 NOTE — Patient Instructions (Addendum)
 Start gabapentin  100mg  three times daily; after 1 one increase after one week to 200mg  three times daily; after another week if needed, you may increase to gabapentin  300mg  three times daily.   Monitor for sedation as discussed  Nice seeing you today

## 2024-01-29 ENCOUNTER — Telehealth: Payer: Self-pay

## 2024-01-29 ENCOUNTER — Other Ambulatory Visit (HOSPITAL_COMMUNITY): Payer: Self-pay

## 2024-01-29 NOTE — Telephone Encounter (Signed)
 Pharmacy Patient Advocate Encounter   Received notification from Onbase that prior authorization for OZEMPIC  is due for renewal.   Insurance verification completed.   The patient is insured through [facility].  Action: PA required; PA submitted to above mentioned insurance via Latent Key/confirmation #/EOC [id number] Status is pending

## 2024-01-29 NOTE — Telephone Encounter (Signed)
 Pharmacy Patient Advocate Encounter  Received notification from [employer] that Prior Authorization for Ozempic  (2 MG/DOSE) 8MG /3ML pen-injectors  has been APPROVED from 01/29/24 to 01/28/25. Ran test claim, Copay is $40. This test claim was processed through [facility]- copay amounts may vary at other pharmacies due to pharmacy/plan contracts, or as the patient moves through the different stages of their insurance plan.   PA #/Case ID/Reference #: [id number]

## 2024-02-23 ENCOUNTER — Telehealth: Payer: Self-pay | Admitting: Family

## 2024-02-23 NOTE — Telephone Encounter (Signed)
 Lm and sent MyChart message:  Your appointment on 02/24/2024 needs to be rescheduled. Please call the office or reschedule your appointment through MyChart.  [id number] please reschedule appt

## 2024-02-24 ENCOUNTER — Ambulatory Visit: Payer: Self-pay | Admitting: Family

## 2024-03-01 ENCOUNTER — Encounter: Payer: Self-pay | Admitting: Family

## 2024-03-02 ENCOUNTER — Other Ambulatory Visit: Payer: Self-pay

## 2024-03-02 DIAGNOSIS — E1140 Type 2 diabetes mellitus with diabetic neuropathy, unspecified: Secondary | ICD-10-CM

## 2024-03-02 MED ORDER — SEMAGLUTIDE (2 MG/DOSE) 8 MG/3ML ~~LOC~~ SOPN: 2.0000 mg | PEN_INJECTOR | SUBCUTANEOUS | 2 refills | Status: AC

## 2024-03-02 MED ORDER — GABAPENTIN 100 MG PO CAPS: 100.0000 mg | ORAL_CAPSULE | Freq: Three times a day (TID) | ORAL | 3 refills | Status: AC

## 2024-09-08 ENCOUNTER — Other Ambulatory Visit

## 2024-09-15 ENCOUNTER — Ambulatory Visit: Admitting: Urology
